# Patient Record
Sex: Female | Born: 1960 | Race: White | Hispanic: No | Marital: Single | Smoking: Current every day smoker
Health system: Southern US, Community
[De-identification: ages and names within clinical notes are randomized; demographics above are authoritative.]

## PROBLEM LIST (undated history)

## (undated) DIAGNOSIS — I10 Essential (primary) hypertension: Secondary | ICD-10-CM

## (undated) DIAGNOSIS — K219 Gastro-esophageal reflux disease without esophagitis: Secondary | ICD-10-CM

## (undated) DIAGNOSIS — I639 Cerebral infarction, unspecified: Secondary | ICD-10-CM

## (undated) DIAGNOSIS — J449 Chronic obstructive pulmonary disease, unspecified: Secondary | ICD-10-CM

## (undated) DIAGNOSIS — F329 Major depressive disorder, single episode, unspecified: Secondary | ICD-10-CM

## (undated) DIAGNOSIS — M1990 Unspecified osteoarthritis, unspecified site: Secondary | ICD-10-CM

## (undated) DIAGNOSIS — F319 Bipolar disorder, unspecified: Secondary | ICD-10-CM

## (undated) DIAGNOSIS — K760 Fatty (change of) liver, not elsewhere classified: Secondary | ICD-10-CM

## (undated) DIAGNOSIS — Z8673 Personal history of transient ischemic attack (TIA), and cerebral infarction without residual deficits: Secondary | ICD-10-CM

## (undated) DIAGNOSIS — M545 Low back pain, unspecified: Secondary | ICD-10-CM

## (undated) DIAGNOSIS — M797 Fibromyalgia: Secondary | ICD-10-CM

## (undated) DIAGNOSIS — R112 Nausea with vomiting, unspecified: Secondary | ICD-10-CM

## (undated) DIAGNOSIS — J45909 Unspecified asthma, uncomplicated: Secondary | ICD-10-CM

## (undated) DIAGNOSIS — F32A Depression, unspecified: Secondary | ICD-10-CM

## (undated) DIAGNOSIS — Z8489 Family history of other specified conditions: Secondary | ICD-10-CM

## (undated) DIAGNOSIS — E669 Obesity, unspecified: Secondary | ICD-10-CM

## (undated) DIAGNOSIS — B1910 Unspecified viral hepatitis B without hepatic coma: Secondary | ICD-10-CM

## (undated) DIAGNOSIS — E785 Hyperlipidemia, unspecified: Secondary | ICD-10-CM

## (undated) DIAGNOSIS — F419 Anxiety disorder, unspecified: Secondary | ICD-10-CM

## (undated) DIAGNOSIS — M542 Cervicalgia: Secondary | ICD-10-CM

## (undated) DIAGNOSIS — Z8711 Personal history of peptic ulcer disease: Secondary | ICD-10-CM

## (undated) DIAGNOSIS — Z8719 Personal history of other diseases of the digestive system: Secondary | ICD-10-CM

## (undated) DIAGNOSIS — K429 Umbilical hernia without obstruction or gangrene: Secondary | ICD-10-CM

## (undated) DIAGNOSIS — G8929 Other chronic pain: Secondary | ICD-10-CM

## (undated) DIAGNOSIS — Z9889 Other specified postprocedural states: Secondary | ICD-10-CM

## (undated) DIAGNOSIS — G43909 Migraine, unspecified, not intractable, without status migrainosus: Secondary | ICD-10-CM

## (undated) HISTORY — PX: ANAL FISSURE REPAIR: SHX2312

## (undated) HISTORY — PX: FRACTURE SURGERY: SHX138

---

## 1997-04-07 HISTORY — PX: SHOULDER SURGERY: SHX246

## 1998-04-07 HISTORY — PX: SHOULDER ARTHROSCOPY W/ ROTATOR CUFF REPAIR: SHX2400

## 1998-05-08 ENCOUNTER — Ambulatory Visit (HOSPITAL_COMMUNITY): Admission: RE | Admit: 1998-05-08 | Discharge: 1998-05-08 | Payer: Self-pay | Admitting: Neurology

## 2000-06-26 ENCOUNTER — Encounter: Admission: RE | Admit: 2000-06-26 | Discharge: 2000-06-26 | Payer: Self-pay | Admitting: Gastroenterology

## 2000-06-26 ENCOUNTER — Encounter: Payer: Self-pay | Admitting: Gastroenterology

## 2002-09-22 ENCOUNTER — Ambulatory Visit (HOSPITAL_BASED_OUTPATIENT_CLINIC_OR_DEPARTMENT_OTHER): Admission: RE | Admit: 2002-09-22 | Discharge: 2002-09-22 | Payer: Self-pay | Admitting: Orthopedic Surgery

## 2003-05-08 ENCOUNTER — Encounter: Admission: RE | Admit: 2003-05-08 | Discharge: 2003-05-08 | Payer: Self-pay | Admitting: Otolaryngology

## 2004-05-21 ENCOUNTER — Encounter: Admission: RE | Admit: 2004-05-21 | Discharge: 2004-05-21 | Payer: Self-pay | Admitting: Allergy and Immunology

## 2005-03-05 ENCOUNTER — Emergency Department (HOSPITAL_COMMUNITY): Admission: EM | Admit: 2005-03-05 | Discharge: 2005-03-05 | Payer: Self-pay | Admitting: Emergency Medicine

## 2005-06-10 ENCOUNTER — Other Ambulatory Visit: Admission: RE | Admit: 2005-06-10 | Discharge: 2005-06-10 | Payer: Self-pay | Admitting: Obstetrics and Gynecology

## 2006-08-03 ENCOUNTER — Ambulatory Visit: Payer: Self-pay | Admitting: Internal Medicine

## 2006-08-24 ENCOUNTER — Ambulatory Visit: Payer: Self-pay | Admitting: Internal Medicine

## 2006-11-19 DIAGNOSIS — K219 Gastro-esophageal reflux disease without esophagitis: Secondary | ICD-10-CM

## 2006-11-19 DIAGNOSIS — J45909 Unspecified asthma, uncomplicated: Secondary | ICD-10-CM | POA: Insufficient documentation

## 2006-11-19 DIAGNOSIS — F329 Major depressive disorder, single episode, unspecified: Secondary | ICD-10-CM

## 2006-11-19 DIAGNOSIS — R51 Headache: Secondary | ICD-10-CM

## 2006-11-19 DIAGNOSIS — Z8619 Personal history of other infectious and parasitic diseases: Secondary | ICD-10-CM

## 2006-11-19 DIAGNOSIS — R519 Headache, unspecified: Secondary | ICD-10-CM | POA: Insufficient documentation

## 2006-11-26 ENCOUNTER — Ambulatory Visit: Payer: Self-pay | Admitting: Internal Medicine

## 2006-12-03 ENCOUNTER — Encounter: Admission: RE | Admit: 2006-12-03 | Discharge: 2006-12-03 | Payer: Self-pay | Admitting: Gastroenterology

## 2006-12-10 ENCOUNTER — Encounter: Admission: RE | Admit: 2006-12-10 | Discharge: 2006-12-10 | Payer: Self-pay | Admitting: Gastroenterology

## 2007-01-25 ENCOUNTER — Other Ambulatory Visit (HOSPITAL_COMMUNITY): Admission: RE | Admit: 2007-01-25 | Discharge: 2007-04-25 | Payer: Self-pay | Admitting: Psychiatry

## 2007-04-19 ENCOUNTER — Telehealth (INDEPENDENT_AMBULATORY_CARE_PROVIDER_SITE_OTHER): Payer: Self-pay | Admitting: *Deleted

## 2007-04-20 DIAGNOSIS — R05 Cough: Secondary | ICD-10-CM

## 2007-04-26 ENCOUNTER — Telehealth (INDEPENDENT_AMBULATORY_CARE_PROVIDER_SITE_OTHER): Payer: Self-pay | Admitting: *Deleted

## 2007-08-31 ENCOUNTER — Telehealth (INDEPENDENT_AMBULATORY_CARE_PROVIDER_SITE_OTHER): Payer: Self-pay | Admitting: *Deleted

## 2007-09-06 ENCOUNTER — Ambulatory Visit: Payer: Self-pay | Admitting: Family Medicine

## 2007-09-06 DIAGNOSIS — M51379 Other intervertebral disc degeneration, lumbosacral region without mention of lumbar back pain or lower extremity pain: Secondary | ICD-10-CM | POA: Insufficient documentation

## 2007-09-06 DIAGNOSIS — M5137 Other intervertebral disc degeneration, lumbosacral region: Secondary | ICD-10-CM

## 2007-09-10 ENCOUNTER — Telehealth: Payer: Self-pay | Admitting: *Deleted

## 2007-09-16 ENCOUNTER — Encounter (INDEPENDENT_AMBULATORY_CARE_PROVIDER_SITE_OTHER): Payer: Self-pay | Admitting: *Deleted

## 2007-09-16 ENCOUNTER — Ambulatory Visit: Payer: Self-pay | Admitting: Family Medicine

## 2007-10-22 ENCOUNTER — Ambulatory Visit: Payer: Self-pay | Admitting: Family Medicine

## 2007-10-22 DIAGNOSIS — H538 Other visual disturbances: Secondary | ICD-10-CM

## 2007-10-22 DIAGNOSIS — R5383 Other fatigue: Secondary | ICD-10-CM | POA: Insufficient documentation

## 2007-10-22 DIAGNOSIS — R5381 Other malaise: Secondary | ICD-10-CM

## 2007-10-25 LAB — CONVERTED CEMR LAB
AST: 18 units/L (ref 0–37)
Albumin: 3.6 g/dL (ref 3.5–5.2)
Alkaline Phosphatase: 58 units/L (ref 39–117)
BUN: 8 mg/dL (ref 6–23)
Bilirubin, Direct: 0.1 mg/dL (ref 0.0–0.3)
CO2: 27 meq/L (ref 19–32)
Chloride: 104 meq/L (ref 96–112)
Eosinophils Relative: 0.8 % (ref 0.0–5.0)
GFR calc Af Amer: 99 mL/min
Glucose, Bld: 84 mg/dL (ref 70–99)
Lymphocytes Relative: 28.3 % (ref 12.0–46.0)
Monocytes Absolute: 0.4 10*3/uL (ref 0.1–1.0)
Monocytes Relative: 5.9 % (ref 3.0–12.0)
Neutrophils Relative %: 64.7 % (ref 43.0–77.0)
Platelets: 247 10*3/uL (ref 150–400)
Potassium: 4.2 meq/L (ref 3.5–5.1)
RDW: 12.6 % (ref 11.5–14.6)
Sodium: 137 meq/L (ref 135–145)
Total Protein: 6.7 g/dL (ref 6.0–8.3)
WBC: 7.6 10*3/uL (ref 4.5–10.5)

## 2007-10-28 ENCOUNTER — Encounter: Payer: Self-pay | Admitting: Family Medicine

## 2008-03-04 DIAGNOSIS — J069 Acute upper respiratory infection, unspecified: Secondary | ICD-10-CM | POA: Insufficient documentation

## 2008-03-06 ENCOUNTER — Ambulatory Visit: Payer: Self-pay | Admitting: Family Medicine

## 2008-07-25 ENCOUNTER — Telehealth: Payer: Self-pay | Admitting: Family Medicine

## 2008-10-27 ENCOUNTER — Telehealth: Payer: Self-pay | Admitting: Family Medicine

## 2008-11-16 ENCOUNTER — Telehealth: Payer: Self-pay | Admitting: Family Medicine

## 2008-11-17 ENCOUNTER — Telehealth: Payer: Self-pay | Admitting: *Deleted

## 2008-11-17 ENCOUNTER — Telehealth: Payer: Self-pay | Admitting: Family Medicine

## 2008-11-22 ENCOUNTER — Telehealth: Payer: Self-pay | Admitting: Family Medicine

## 2008-11-23 ENCOUNTER — Telehealth: Payer: Self-pay | Admitting: Family Medicine

## 2008-12-04 ENCOUNTER — Encounter: Payer: Self-pay | Admitting: *Deleted

## 2008-12-04 ENCOUNTER — Ambulatory Visit: Payer: Self-pay | Admitting: Family Medicine

## 2008-12-04 ENCOUNTER — Telehealth: Payer: Self-pay | Admitting: Family Medicine

## 2008-12-04 DIAGNOSIS — J029 Acute pharyngitis, unspecified: Secondary | ICD-10-CM | POA: Insufficient documentation

## 2008-12-04 LAB — CONVERTED CEMR LAB: Rapid Strep: NEGATIVE

## 2008-12-12 ENCOUNTER — Telehealth: Payer: Self-pay | Admitting: Family Medicine

## 2010-04-28 ENCOUNTER — Encounter: Payer: Self-pay | Admitting: Gastroenterology

## 2010-08-15 ENCOUNTER — Telehealth: Payer: Self-pay | Admitting: Family Medicine

## 2010-08-15 NOTE — Telephone Encounter (Signed)
Pt called and said that she has bronchitis again. Pt no longer has health insurance and does not have a job. Pt is req a cheaper cough medicine than Tussin cough syrup Dr Tawanna Cooler normally calls in. Pt can not afford this med. Pt says Dr Tawanna Cooler normally call in zpak and some type of sterroid and cough syrup. Pls call in to [facility] on [address].

## 2010-08-15 NOTE — Telephone Encounter (Signed)
Dr Tawanna Cooler does not call in medication without an seeing a patient.  She will need an office visit

## 2010-08-16 NOTE — Telephone Encounter (Signed)
I called pt and informed her that she would have to sch ov, as noted. Pt said that she would have to call back.

## 2010-08-20 NOTE — Assessment & Plan Note (Signed)
[facility]                             PULMONARY OFFICE NOTE   NAME:STRIPEJamacia, Jester                    MRN:          [id number]  DATE:11/26/2006                            DOB:          26-Oct-1960    PULMONARY EXTENDED FOLLOWUP VISIT   HISTORY:  A 50 year old white female who states she has been coughing  since the age of 1 and was seen on April 28 and cured of the cough,  which resumed after she stopped taking the medicines, and on May 19 was  seen again with the same symptoms, and again has complete resolution of  all coughing after a combination of Nexium and tramadol, which she again  stopped.  Now, she comes in not coughing, but having increasing upper  abdominal discomfort that is worse when she bends over and associated  with dyspnea that is paroxysmal in nature and not associated with  significant cough or pleuritic pain, fevers, chills, sweats, orthopnea,  PND, or leg swelling.   She called in and requested a treadmill to figure out why she was short  of breath.  However, I did not gather that she had proportionate  dyspnea with exertion either over the phone or today during the  interview.  She also denies any reproducible exertional chest pain. Her  midline pain is slightly worse with deep inspiration and better lying  flat.   PHYSICAL EXAMINATION:  She is an ambulatory obese white female who  cannot answer questions in a straight-forward manner without going off  on a tangent, or skipping to another complaint all together.  VITAL SIGNS:  Stable vital signs.  HEENT:  Unremarkable.  Oropharynx is clear.  LUNGS:  The lung fields are perfectly clear bilaterally to auscultation  and percussion.  CARDIAC:  Regular rate and rhythm without murmur, gallop, or rub.  ABDOMEN:  Soft and benign.  EXTREMITIES:  Warm without calf tenderness, cyanosis, clubbing, or  edema.   Hemoglobin saturation is 97% on room air and we walked her around  the  office 3 full laps.  She did not appear tachypneic, or even overtly  tired with the activity, and her saturation maintained in the mid 90s.   IMPRESSION:  Upper abdominal pain, most likely is either  gastroesophageal reflux disease or related to irritable bowel syndrome.  I have recommended that she take the Nexium perfectly regularly 40 mg  before breakfast and try Pepcid AC 20 mg at bedtime, along with Citrucel  1 teaspoon b.i.d.  If she is having significant abdominal pain, she can  try Librax 1 q.4 p.r.n. while awaiting followup by Dr. Vida Rigger, her  GI physician of record.   Having eliminated all of her coughing with treatment of GERD, and  documented excellent exercise tolerance with no desaturation, I do not  believe pulmonary followup in general, nor a stress test in particular  will be of any value here, and we will see her back on a p.r.n. basis  only.     Charlaine Dalton. Sherene Sires, MD, [facility]  Electronically Signed    MBW/MedQ  DD: 11/26/2006  DT:  11/27/2006  Job #: [id number]   cc:   Petra Kuba, M.D.

## 2010-08-20 NOTE — Assessment & Plan Note (Signed)
[facility]                             PULMONARY OFFICE NOTE   Tammy, Hines                    MRN:          [id number]  DATE:08/24/2006                            DOB:          [birth year]    PULMONARY EXTENDED SUMMARY FOLLOWUP VISIT   HISTORY:  A 50 year old white female, seen on April 28, with a complaint  of coughing since she was a year old. She comes in now complaining of  paroxysms of dyspnea that occur at rest and were not directly related to  cough, but have been present for several years. She is particularly  discouraged that she cannot go up two flights of steps with a backpack  on.  When I mentioned to her that the reason that she was here was for  followup for the cough, she said that that got better, but it always  gets better.  In other words, I was not sure that she actually gave  credit to any of the interventions that were recommended.   She does describe an increasing frequency and severity of coughing  paroxysms to the point where she vomits and urinates. She says sometimes  they are triggered by cold and at other times by seasonal allergies,  spring greater than fall.   What is clear, however, is that there was 100% resolution of the cough  on her previous visit, but no effect at all on her dyspnea.   Today, she describes her dyspnea as occurring paroxysmally at rest. Last  time she described dyspnea only with heavy activity. She also  occasionally wakes up at night and cannot breath. These episodes are  not associated with chest pain and get better with nothing or faster  with Xanax, but she does not want to keep taking that.   PHYSICAL EXAMINATION:  She is an animated white female who has trouble  to sticking to one complaint at a time before shifting to another  completely unrelated issue. She has stable vital signs.  HEENT: Is unremarkable. Oropharynx is clear.  NECK: Supple without cervical adenopathy or  tenderness. Trachea is  midline without organomegaly.  LUNGS: Lung fields are clear bilaterally today to auscultation and  percussion with excellent air movement despite the fact that she states  that she cannot get breath at present.  HEART: Regular rate and rhythm without murmur, gallop or rub or  tachycardia.  ABDOMEN: Soft, benign.  EXTREMITIES: Warm without calf tenderness, cyanosis, clubbing or edema.   IMPRESSION:  Extended discussion with this patient, lasting almost an  hour today, on the following topics:  1. Clearly the cough, regardless of the exact mechanism that is      triggering it, is partly reflux related in that she coughs so hard      she vomits. To offset this problem, I have recommended that she      continue to take omeprazole 30 to 60 minutes before her first and      last meal until any cough or throat clearing is totally delineated      for two full weeks without the need  for any cough suppressants. If      it is not maintained under control with high dose omeprazole, the      next step would be to consider adding Reglan because she says this      worked better than anything else (although note she did not give      it any credit at all because she denies she has heartburn, a common      scenario that is not ).  2. Paroxysm of dyspnea that occurs at rest and resolved without      treatment, most likely panic disorder. I suggested that she use the      Xanax that was previously prescribed.  3. The dyspnea that occurs and reproduced with exercise has been      present for about 16 years and this corresponds to weight gain      and deconditioning historically. I have recommended therefore that      she exercise to a level where she is short of breath but not out of      breath 30 minutes daily for two full weeks to see if there is any      training benefit. If not, a CPST needs to be considered and I have      given her the phone number to call for it.   I  have not specifically excluded the possibility that she has asthma,  but note that the pattern is much more consistent with either panic  disorder, reflux or both and do not recommend treatment for asthma at  all at this point.   I did recommend for nasal drainage to use Zyrtec 10 mg q nightly p.r.n.  and gave her a prescription for generic product.   For cyclical cough, I have recommended Delsym supplemented with Tramadol  and have explained this strategy to her as well.     Charlaine Dalton. Sherene Sires, MD, [facility]  Electronically Signed    MBW/MedQ  DD: 08/24/2006  DT: 08/24/2006  Job #: [id number]

## 2010-08-23 NOTE — Assessment & Plan Note (Signed)
[facility]                             PULMONARY OFFICE NOTE   NAME:Tammy, Hines                    MRN:          [id number]  DATE:08/03/2006                            DOB:          08-Jun-1960    PULMONARY NEW PATIENT EVALUATION:   CHIEF COMPLAINT:  Cough and dyspnea.   HISTORY:  This is a 50 year old female complaining of cough since she  was 50 year old.  The pattern actually is not one of a chronic cough but  rather a recurrent pattern that happens several times per year to the  point where every time she starts coughing she can't stop.  It  typically lasts from 4 weeks to 3 months and eventually goes away with  several rounds of antibiotics and prednisone.  It also flares in the  spring and the fall reproducibly.  For that reason, she underwent  allergy testing in [place], she thinks maybe at Dr. Cyndee Brightly office  that was most positive to dust and mold only.  For the last several  years, it has been much worse, and now it has been persistent for the  last several weeks where she is coughing to the point where she vomits  and urinates on herself, and therefore sought the help of her insurance  nurse, who referred her to our practice.   Presently, the patient coughs more during the day than the night and  does not actually produce much mucus at all.  She denies any fevers,  chills, sweats, orthopnea, PND, or leg swelling, itching, sneezing, or  wheezing.   PAST MEDICAL HISTORY:  1. Moderate obesity.  2. Diagnosis of asthma.  3. Allergies, as noted above.   ALLERGIES:  PENICILLIN and CODEINE, as well as AMOXICILLIN, all  nonspecific reactions.   MEDICATIONS:  Xanax, trazodone, Tessalon, Proventil, and Nasacort (none  of which she says helps).   SOCIAL HISTORY:  She quit smoking in October 2007, and works as a  Company secretary.   FAMILY HISTORY:  Significant for the absence of HB or asthma.   Please see worksheet for  details.   REVIEW OF SYSTEMS:  Taken in detail on the worksheet.  Negative, except  as outlined above.  She does cough so hard, it also hurts in the center  of her chest when she coughs severely.  She is also short of breath with  exertion when she is coughing.  and if she is not coughing she denies  any dyspnea.   PHYSICAL EXAMINATION:  GENERAL: Obese ambulatory white female in no  acute distress, with classic voice fatigue.  VITAL SIGNS:  She is afebrile, with normal vital signs.  HEENT: Unremarkable. Oropharynx is clear.  Dentition intact.  NECK:  Supple, without cervical adenopathy or tenderness.  Trachea is in  the midline.  No thyromegaly.  LUNG FIELDS:  Reveal classic pseudo-wheeze only.  Overall air movement  is excellent.  HEART:  There is a regular rhythm, without murmur, gallop, or rub.  ABDOMEN: Soft, benign.  EXTREMITIES: Warm, without calf tenderness, cyanosis, clubbing, or  edema.   IMPRESSION:  Recurrent pattern  of upper airway cough dating back many  years.  Probably is triggered by URI or viral illness, but then  developed cyclical features with reflux and airway trauma contributing  to destabilization and further coughing.   I therefore recommend the following approach:  1. First, I reviewed a GERD diet and asked her to stop all mint and      menthol products (apparently, she likes Hall's).  2. Start Nexium 40 mg before breakfast and supper perfectly regularly      for the next 2 weeks, and suppress excess coughing with Delsym 2      tsp b.i.d. supplemented with Mepergan, and use prednisone 10 mg      tablets, #14, to taper off over 6 days.   FOLLOWUP:  Will be in 1 weeks, with a chest x-ray and sinus CT scan as  the next logical steps if not improving on the above empiric regimen.     Charlaine Dalton. Sherene Sires, MD, [facility]  Electronically Signed    MBW/MedQ  DD: 08/03/2006  DT: 08/04/2006  Job #: [id number]

## 2014-01-18 ENCOUNTER — Other Ambulatory Visit: Payer: Self-pay | Admitting: Internal Medicine

## 2014-01-18 DIAGNOSIS — Z1239 Encounter for other screening for malignant neoplasm of breast: Secondary | ICD-10-CM

## 2014-01-25 ENCOUNTER — Other Ambulatory Visit: Payer: Self-pay | Admitting: Internal Medicine

## 2014-01-25 DIAGNOSIS — Z1231 Encounter for screening mammogram for malignant neoplasm of breast: Secondary | ICD-10-CM

## 2014-01-27 ENCOUNTER — Ambulatory Visit
Admission: RE | Admit: 2014-01-27 | Discharge: 2014-01-27 | Disposition: A | Discharge status: Home / Self Care | Payer: Medicare Other | Source: Ambulatory Visit | Attending: Internal Medicine | Admitting: Internal Medicine

## 2014-01-27 DIAGNOSIS — Z1231 Encounter for screening mammogram for malignant neoplasm of breast: Secondary | ICD-10-CM

## 2014-01-31 ENCOUNTER — Other Ambulatory Visit: Payer: Self-pay | Admitting: Internal Medicine

## 2014-01-31 DIAGNOSIS — R928 Other abnormal and inconclusive findings on diagnostic imaging of breast: Secondary | ICD-10-CM

## 2014-02-13 ENCOUNTER — Ambulatory Visit
Admission: RE | Admit: 2014-02-13 | Discharge: 2014-02-13 | Disposition: A | Discharge status: Home / Self Care | Payer: Medicare Other | Source: Ambulatory Visit | Attending: Internal Medicine | Admitting: Internal Medicine

## 2014-02-13 DIAGNOSIS — R928 Other abnormal and inconclusive findings on diagnostic imaging of breast: Secondary | ICD-10-CM

## 2014-11-19 ENCOUNTER — Emergency Department (HOSPITAL_COMMUNITY)
Admission: EM | Admit: 2014-11-19 | Discharge: 2014-11-19 | Disposition: A | Discharge status: Home / Self Care | Payer: Medicare Other | Attending: Emergency Medicine | Admitting: Emergency Medicine

## 2014-11-19 DIAGNOSIS — Z23 Encounter for immunization: Secondary | ICD-10-CM | POA: Insufficient documentation

## 2014-11-19 DIAGNOSIS — T63461A Toxic effect of venom of wasps, accidental (unintentional), initial encounter: Secondary | ICD-10-CM | POA: Insufficient documentation

## 2014-11-19 DIAGNOSIS — Z880 Allergy status to penicillin: Secondary | ICD-10-CM | POA: Insufficient documentation

## 2014-11-19 DIAGNOSIS — Y9289 Other specified places as the place of occurrence of the external cause: Secondary | ICD-10-CM | POA: Diagnosis not present

## 2014-11-19 DIAGNOSIS — Y998 Other external cause status: Secondary | ICD-10-CM | POA: Diagnosis not present

## 2014-11-19 DIAGNOSIS — W57XXXA Bitten or stung by nonvenomous insect and other nonvenomous arthropods, initial encounter: Secondary | ICD-10-CM

## 2014-11-19 DIAGNOSIS — Y9389 Activity, other specified: Secondary | ICD-10-CM | POA: Diagnosis not present

## 2014-11-19 DIAGNOSIS — M7989 Other specified soft tissue disorders: Secondary | ICD-10-CM

## 2014-11-19 MED ORDER — CEPHALEXIN 500 MG PO CAPS: 500.0000 mg | ORAL_CAPSULE | Freq: Four times a day (QID) | ORAL | Status: DC

## 2014-11-19 MED ORDER — HYDROCODONE-ACETAMINOPHEN 5-325 MG PO TABS
1.0000 | ORAL_TABLET | Freq: Once | ORAL | Status: AC
  Administered 2014-11-19: 1 via ORAL
  Filled 2014-11-19: qty 1

## 2014-11-19 MED ORDER — TETANUS-DIPHTH-ACELL PERTUSSIS 5-2.5-18.5 LF-MCG/0.5 IM SUSP
0.5000 mL | Freq: Once | INTRAMUSCULAR | Status: AC
  Administered 2014-11-19: 0.5 mL via INTRAMUSCULAR
  Filled 2014-11-19: qty 0.5

## 2014-11-19 MED ORDER — IBUPROFEN 400 MG PO TABS: 800.0000 mg | ORAL_TABLET | Freq: Once | ORAL | Status: DC

## 2014-11-19 NOTE — ED Notes (Signed)
Patient here with complaint of left middle finger swelling secondary to a yellow jacket sting on that finger. More concerning however is that patient has a ring in place at the base of that finger. Swelling has caused ring to become imbedded in skin. Finger is discolored and painful.

## 2014-11-19 NOTE — ED Provider Notes (Signed)
CSN: [id number]     Arrival date & time 11/19/14  0000 History   First MD Initiated Contact with Patient 11/19/14 0031     Chief Complaint  Patient presents with  . Insect Bite  . Joint Swelling     (Consider location/radiation/quality/duration/timing/severity/associated sxs/prior Treatment) HPI Comments: Pt is a 54 yo female who presents to the ED complaining of insect bite and swelling to right 3rd digit, onset yesterday. Pt reports that she was stung by a yellow jacket yesterday afternoon on the dorsal aspect of her right 3rd finger near her PCP. She notes that she woke up this morning and had severe swelling and pain of her finger. She states that she is unable to remove the ring on her finger due to the swelling. Pt reports that she used ice her finger and took a hydrocodone pill (which is prescribed to her for back pain) which had some pain relief. She notes she noticed blisters that formed on the sides of her finger today which began to weep.    No past medical history on file. No past surgical history on file. No family history on file. Social History  Substance Use Topics  . Smoking status: Not on file  . Smokeless tobacco: Not on file  . Alcohol Use: Not on file   OB History    No data available     Review of Systems  Musculoskeletal: Positive for joint swelling.  All other systems reviewed and are negative.     Allergies  Bupropion hcl; Clonazepam; and Penicillins  Home Medications   Prior to Admission medications   Not on File   BP 138/98 mmHg  Pulse 83  Temp(Src) 97.8 F (36.6 C) (Oral)  Resp 12  Ht  (1.727 m)  Wt 287 lb 3 oz (130.267 kg)  BMI 43.68 kg/m2  SpO2 95%  LMP  (LMP Unknown) Physical Exam  Constitutional: She appears well-developed and well-nourished.  HENT:  Head: Normocephalic and atraumatic.  Eyes: Conjunctivae and EOM are normal. Right eye exhibits no discharge. Left eye exhibits no discharge.  Neck: Normal range of motion.   Cardiovascular: Normal rate.   Pulmonary/Chest: Effort normal.  Musculoskeletal:  Swelling of right 3rd digit with weeping blisters located on the medial and lateral aspect of the digit. Ring present on digit. Finger is dusky but has good capillary refill and is warm. Sensation intact of right 3rd digit. ROM limited due to swelling.    Neurological: She is alert.    ED Course  Procedures (including critical care time) Labs Review Labs Reviewed - No data to display  Imaging Review No results found. I, Barrett Henle, personally reviewed and evaluated these images and lab results as part of my medical decision-making.    MDM   Final diagnoses:  Insect bite  Swelling of finger, left    Pt presents with severe swelling of right 3rd digit with ring present on finger. Finger is dusky but is warm with good capillary refill.  Ring will be removed and then re-examined. Pt given pain meds while in ED. Pt reports pain has improved.   Ring removed with ring cutter without any complications.  Finger re-examined and skin is pink and warm with good capillary refill.  Discussed plan for d/c with pt. Pt also given prescription for keflex. Pt advised to return to the ED if sxs worsen. Pt agrees with plan.   Satira Sark [place], [place] 11/19/14 0244  Dione Booze, MD 11/19/14 [phone]

## 2014-11-19 NOTE — ED Notes (Signed)
Pt left at this time with all belongings.  

## 2015-07-11 ENCOUNTER — Other Ambulatory Visit: Payer: Self-pay | Admitting: Internal Medicine

## 2015-07-11 DIAGNOSIS — N63 Unspecified lump in unspecified breast: Secondary | ICD-10-CM

## 2015-07-25 ENCOUNTER — Other Ambulatory Visit: Payer: Self-pay | Admitting: Internal Medicine

## 2015-07-25 DIAGNOSIS — N63 Unspecified lump in unspecified breast: Secondary | ICD-10-CM

## 2015-08-30 ENCOUNTER — Emergency Department (HOSPITAL_COMMUNITY): Payer: Medicare Other

## 2015-08-30 ENCOUNTER — Encounter (HOSPITAL_COMMUNITY): Payer: Self-pay

## 2015-08-30 ENCOUNTER — Emergency Department (HOSPITAL_COMMUNITY)
Admission: EM | Admit: 2015-08-30 | Discharge: 2015-08-30 | Disposition: A | Discharge status: Home / Self Care | Payer: Medicare Other | Attending: Emergency Medicine | Admitting: Emergency Medicine

## 2015-08-30 DIAGNOSIS — Z79899 Other long term (current) drug therapy: Secondary | ICD-10-CM | POA: Diagnosis not present

## 2015-08-30 DIAGNOSIS — F17200 Nicotine dependence, unspecified, uncomplicated: Secondary | ICD-10-CM | POA: Diagnosis not present

## 2015-08-30 DIAGNOSIS — R600 Localized edema: Secondary | ICD-10-CM | POA: Diagnosis not present

## 2015-08-30 DIAGNOSIS — E785 Hyperlipidemia, unspecified: Secondary | ICD-10-CM | POA: Diagnosis not present

## 2015-08-30 DIAGNOSIS — R0609 Other forms of dyspnea: Secondary | ICD-10-CM | POA: Insufficient documentation

## 2015-08-30 DIAGNOSIS — M7989 Other specified soft tissue disorders: Secondary | ICD-10-CM | POA: Diagnosis present

## 2015-08-30 DIAGNOSIS — M79605 Pain in left leg: Secondary | ICD-10-CM | POA: Insufficient documentation

## 2015-08-30 DIAGNOSIS — I10 Essential (primary) hypertension: Secondary | ICD-10-CM | POA: Insufficient documentation

## 2015-08-30 DIAGNOSIS — F319 Bipolar disorder, unspecified: Secondary | ICD-10-CM | POA: Insufficient documentation

## 2015-08-30 DIAGNOSIS — M79604 Pain in right leg: Secondary | ICD-10-CM

## 2015-08-30 DIAGNOSIS — R609 Edema, unspecified: Secondary | ICD-10-CM

## 2015-08-30 HISTORY — DX: Hyperlipidemia, unspecified: E78.5

## 2015-08-30 HISTORY — DX: Major depressive disorder, single episode, unspecified: F32.9

## 2015-08-30 HISTORY — DX: Anxiety disorder, unspecified: F41.9

## 2015-08-30 HISTORY — DX: Bipolar disorder, unspecified: F31.9

## 2015-08-30 HISTORY — DX: Depression, unspecified: F32.A

## 2015-08-30 HISTORY — DX: Essential (primary) hypertension: I10

## 2015-08-30 LAB — CBC WITH DIFFERENTIAL/PLATELET
Basophils Absolute: 0 10*3/uL (ref 0.0–0.1)
Basophils Relative: 0 %
Eosinophils Absolute: 0.2 10*3/uL (ref 0.0–0.7)
Eosinophils Relative: 2 %
HCT: 37.9 % (ref 36.0–46.0)
Hemoglobin: 12 g/dL (ref 12.0–15.0)
Lymphocytes Relative: 26 %
Lymphs Abs: 1.9 10*3/uL (ref 0.7–4.0)
MCH: 29.3 pg (ref 26.0–34.0)
MCHC: 31.7 g/dL (ref 30.0–36.0)
MCV: 92.4 fL (ref 78.0–100.0)
Monocytes Absolute: 0.4 10*3/uL (ref 0.1–1.0)
Monocytes Relative: 6 %
Neutro Abs: 4.9 10*3/uL (ref 1.7–7.7)
Neutrophils Relative %: 66 %
Platelets: 207 10*3/uL (ref 150–400)
RBC: 4.1 MIL/uL (ref 3.87–5.11)
RDW: 13.3 % (ref 11.5–15.5)
WBC: 7.4 10*3/uL (ref 4.0–10.5)

## 2015-08-30 LAB — COMPREHENSIVE METABOLIC PANEL
ALT: 27 U/L (ref 14–54)
AST: 24 U/L (ref 15–41)
Albumin: 3.2 g/dL — ABNORMAL LOW (ref 3.5–5.0)
Alkaline Phosphatase: 61 U/L (ref 38–126)
Anion gap: 7 (ref 5–15)
BUN: 15 mg/dL (ref 6–20)
CO2: 28 mmol/L (ref 22–32)
Calcium: 9 mg/dL (ref 8.9–10.3)
Chloride: 101 mmol/L (ref 101–111)
Creatinine, Ser: 1.01 mg/dL — ABNORMAL HIGH (ref 0.44–1.00)
GFR calc Af Amer: >60 mL/min (ref >60)
GFR calc non Af Amer: >60 mL/min (ref >60)
Glucose, Bld: 120 mg/dL — ABNORMAL HIGH (ref 65–99)
Potassium: 3.7 mmol/L (ref 3.5–5.1)
Sodium: 136 mmol/L (ref 135–145)
Total Bilirubin: 0.3 mg/dL (ref 0.3–1.2)
Total Protein: 6.1 g/dL — ABNORMAL LOW (ref 6.5–8.1)

## 2015-08-30 LAB — URINALYSIS, ROUTINE W REFLEX MICROSCOPIC
Bilirubin Urine: NEGATIVE
Glucose, UA: NEGATIVE mg/dL
Hgb urine dipstick: NEGATIVE
Ketones, ur: NEGATIVE mg/dL
Leukocytes, UA: NEGATIVE
Nitrite: NEGATIVE
Protein, ur: NEGATIVE mg/dL
Specific Gravity, Urine: 1.008 (ref 1.005–1.030)
pH: 7.5 (ref 5.0–8.0)

## 2015-08-30 LAB — BRAIN NATRIURETIC PEPTIDE: B Natriuretic Peptide: 26.9 pg/mL (ref 0.0–100.0)

## 2015-08-30 LAB — D-DIMER, QUANTITATIVE: D-Dimer, Quant: 0.47 ug/mL-FEU (ref 0.00–0.50)

## 2015-08-30 NOTE — ED Provider Notes (Signed)
CSN: [id number]     Arrival date & time 08/30/15  0944 History   First MD Initiated Contact with Patient 08/30/15 1022     Chief Complaint  Patient presents with  . Leg Swelling     (Consider location/radiation/quality/duration/timing/severity/associated sxs/prior Treatment) HPI Comments: Patient is a 55 year old female with history of hypertension and hyperlipidemia who presents with a 3 year history of peripheral edema and shortness of breath on exertion. The patient reports her leg swelling has been worsening over the past 3 months. She states she has bilateral leg pain at rest and on exertion in her thighs, as well as her calves. She states that she never has pain or swelling in one leg more than the other. She has been seen by her primary care provider for this problem. She has tried hydrochlorothiazide without relief. She recently switched to Lasix 60mg  BID 2 days ago. Patient states that she feels that her swelling has gotten worse since she began taking the Lasix. However, patient states that she has had to walk more over the past couple days. Patient called her doctor for this issue and she was sent here. Patient denies orthopnea, PND. Patient denies chest pain, abdominal pain, nausea, vomiting, dysuria. Patient states she is not very active and mostly sits in a recliner chair throughout the day. No history of cancer, recent surgery.  The history is provided by the patient.    Past Medical History  Diagnosis Date  . Hypertension   . Hyperlipidemia   . Bipolar 1 disorder (HCC)   . Anxiety   . Depression    Past Surgical History  Procedure Laterality Date  . Anal fissure repair    . Shoulder arthroscopy     No family history on file. Social History  Substance Use Topics  . Smoking status: Current Every Day Smoker  . Smokeless tobacco: None  . Alcohol Use: No   OB History    No data available     Review of Systems  Constitutional: Negative for fever and chills.  HENT:  Negative for facial swelling and sore throat.   Respiratory: Positive for shortness of breath (on exertion).   Cardiovascular: Negative for chest pain.  Gastrointestinal: Negative for nausea, vomiting and abdominal pain.  Genitourinary: Negative for dysuria.  Musculoskeletal: Positive for back pain (chronic).  Skin: Negative for rash and wound.  Neurological: Negative for headaches.  Psychiatric/Behavioral: The patient is not nervous/anxious.       Allergies  Ampicillin; Clonazepam; Penicillins; and Bupropion hcl  Home Medications   Prior to Admission medications   Medication Sig Start Date End Date Taking? Authorizing Provider  alprazolam Prudy Feeler) 2 MG tablet Take 2 mg by mouth 3 (three) times daily as needed for sleep. 1 mg in the morning, 2 mg in the afternoon, 1 mg at night   Yes Historical Provider, MD  Asenapine Maleate (SAPHRIS) 10 MG SUBL Place 20 mg under the tongue at bedtime.   Yes Historical Provider, MD  atenolol (TENORMIN) 100 MG tablet Take 100 mg by mouth daily.   Yes Historical Provider, MD  clomiPRAMINE (ANAFRANIL) 50 MG capsule Take 50 mg by mouth at bedtime.   Yes Historical Provider, MD  cyclobenzaprine (FLEXERIL) 10 MG tablet Take 10 mg by mouth 2 (two) times daily.   Yes Historical Provider, MD  dicyclomine (BENTYL) 10 MG capsule Take 10 mg by mouth 2 (two) times daily as needed for spasms.   Yes Historical Provider, MD  Diethylpropion HCl (TENUATE PO) Take  75 mg by mouth every evening.   Yes Historical Provider, MD  FLUoxetine (PROZAC) 40 MG capsule Take 80 mg by mouth daily.   Yes Historical Provider, MD  furosemide (LASIX) 20 MG tablet Take 60 mg by mouth 2 (two) times daily.   Yes Historical Provider, MD  HYDROcodone-acetaminophen (NORCO) 10-325 MG tablet Take 1 tablet by mouth every 6 (six) hours as needed.   Yes Historical Provider, MD  hydrOXYzine (VISTARIL) 25 MG capsule Take 25 mg by mouth every 6 (six) hours as needed for anxiety.   Yes Historical  Provider, MD  Multiple Vitamins-Minerals (MULTIVITAMIN WITH MINERALS) tablet Take 1 tablet by mouth daily.   Yes Historical Provider, MD  naproxen (NAPROSYN) 250 MG tablet Take 250 mg by mouth 2 (two) times daily with a meal.   Yes Historical Provider, MD  omeprazole (PRILOSEC) 20 MG capsule Take 20 mg by mouth 2 (two) times daily before a meal.   Yes Historical Provider, MD  simvastatin (ZOCOR) 20 MG tablet Take 20 mg by mouth daily.   Yes Historical Provider, MD  cephALEXin (KEFLEX) 500 MG capsule Take 1 capsule (500 mg total) by mouth 4 (four) times daily. 11/19/14   Hannah Muthersbaugh, PA-C   BP 138/113 mmHg  Pulse 88  Temp(Src) 98.4 F (36.9 C) (Oral)  Resp 17  Ht  (1.702 m)  Wt 131.997 kg  BMI 45.57 kg/m2  SpO2 96%  LMP  (LMP Unknown) Physical Exam  Constitutional: She appears well-developed and well-nourished. No distress.  HENT:  Head: Normocephalic and atraumatic.  Mouth/Throat: Oropharynx is clear and moist. No oropharyngeal exudate.  Eyes: Conjunctivae are normal. Pupils are equal, round, and reactive to light. Right eye exhibits no discharge. Left eye exhibits no discharge. No scleral icterus.  Neck: Normal range of motion. Neck supple. No thyromegaly present.  Cardiovascular: Normal rate, regular rhythm, normal heart sounds and intact distal pulses.  Exam reveals no gallop and no friction rub.   No murmur heard. Pulmonary/Chest: Effort normal and breath sounds normal. No stridor. No respiratory distress. She has no wheezes. She has no rales.  Abdominal: Soft. Bowel sounds are normal. She exhibits no distension. There is no tenderness. There is no rebound and no guarding.  Musculoskeletal: She exhibits edema (bilateral).  5/5 strength in all 4 extremities, normal sensation, tenderness throughout palpation to bilateral lower extremities; mild pitting edema bilaterally; no noticeable erythema or asymmetrical edema  Lymphadenopathy:    She has no cervical adenopathy.   Neurological: She is alert. Coordination normal.  Skin: Skin is warm and dry. No rash noted. She is not diaphoretic. No pallor.  Psychiatric: She has a normal mood and affect.  Nursing note and vitals reviewed.   ED Course  Procedures (including critical care time) Labs Review Labs Reviewed  COMPREHENSIVE METABOLIC PANEL - Abnormal; Notable for the following:    Glucose, Bld 120 (*)    Creatinine, Ser 1.01 (*)    Total Protein 6.1 (*)    Albumin 3.2 (*)    All other components within normal limits  BRAIN NATRIURETIC PEPTIDE  CBC WITH DIFFERENTIAL/PLATELET  URINALYSIS, ROUTINE W REFLEX MICROSCOPIC (NOT AT [facility])  D-DIMER, QUANTITATIVE (NOT AT [facility])    Imaging Review Dg Chest 2 View  08/30/2015  CLINICAL DATA:  Bilateral lower extremity swelling for 2 months, chronic shortness of Breath EXAM: CHEST  2 VIEW COMPARISON:  05/21/2004 FINDINGS: The heart size and mediastinal contours are within normal limits. Both lungs are clear. The visualized skeletal structures  are unremarkable. IMPRESSION: No active cardiopulmonary disease. Electronically Signed   By: Alcide CleverMark  Lukens M.D.   On: 08/30/2015 11:15   I have personally reviewed and evaluated these images and lab results as part of my medical decision-making.   EKG Interpretation   Date/Time:  Thursday Aug 30 2015 11:40:48 EDT Ventricular Rate:  80 PR Interval:  169 QRS Duration: 99 QT Interval:  408 QTC Calculation: 471 R Axis:   -22 Text Interpretation:  Sinus rhythm Borderline left axis deviation Low  voltage, precordial leads Abnormal R-wave progression, early transition No  old tracing to compare Confirmed by JACUBOWITZ  MD, SAM [phone]) on  08/30/2015 11:43:01 AM      MDM   Patient states she feels her swelling has improved since arrival and lying with her legs elevated in the bed. EKG shows NSR, borderline left axis deviation, abnormal R-wave progression. Chest x-ray shows no active cardiopulmonary disease. CBC unremarkable.  CMP shows glucose 120, creatinine 1.01, protein 6.1, albumin 3.2. D-dimer 0.47. BNP 26.9. UA negative. Well's criteria and d-dimer 0.47 makes DVT unlikely. Patient to be discharged to continue at home Lasix with follow-up to PCP and cardiology for further evaluation and treatment. Patient understands and is in agreement with plan. Patient also evaluated by Dr. Ethelda ChickJacubowitz who is in agreement with plan. Patient vitals stable throughout ED course and discharged in satisfactory condition.  Final diagnoses:  Peripheral edema  Bilateral leg pain        Emi Holeslexandra M Eller Sweis, PA-C 08/30/15 1355  Doug SouSam Jacubowitz, MD 08/31/15 [phone]

## 2015-08-30 NOTE — Discharge Instructions (Signed)
Treatment: Continue to take your Lasix as prescribed by your primary care provider. Elevate your legs whenever you are sitting or laying down.  Follow-up: Please follow-up with your primary care provider for further evaluation and treatment of your chronic leg swelling. Please follow-up with cardiology for further evaluation and treatment. Please follow-up with orthopedics for further evaluation of your back pain and thigh numbness.   Edema Edema is an abnormal buildup of fluids in your bodytissues. Edema is somewhatdependent on gravity to pull the fluid to the lowest place in your body. That makes the condition more common in the legs and thighs (lower extremities). Painless swelling of the feet and ankles is common and becomes more likely as you get older. It is also common in looser tissues, like around your eyes.  When the affected area is squeezed, the fluid may move out of that spot and leave a dent for a few moments. This dent is called pitting.  CAUSES  There are many possible causes of edema. Eating too much salt and being on your feet or sitting for a long time can cause edema in your legs and ankles. Hot weather may make edema worse. Common medical causes of edema include:  Heart failure.  Liver disease.  Kidney disease.  Weak blood vessels in your legs.  Cancer.  An injury.  Pregnancy.  Some medications.  Obesity. SYMPTOMS  Edema is usually painless.Your skin may look swollen or shiny.  DIAGNOSIS  Your health care provider may be able to diagnose edema by asking about your medical history and doing a physical exam. You may need to have tests such as X-rays, an electrocardiogram, or blood tests to check for medical conditions that may cause edema.  TREATMENT  Edema treatment depends on the cause. If you have heart, liver, or kidney disease, you need the treatment appropriate for these conditions. General treatment may include:  Elevation of the affected body part  above the level of your heart.  Compression of the affected body part. Pressure from elastic bandages or support stockings squeezes the tissues and forces fluid back into the blood vessels. This keeps fluid from entering the tissues.  Restriction of fluid and salt intake.  Use of a water pill (diuretic). These medications are appropriate only for some types of edema. They pull fluid out of your body and make you urinate more often. This gets rid of fluid and reduces swelling, but diuretics can have side effects. Only use diuretics as directed by your health care provider. HOME CARE INSTRUCTIONS   Keep the affected body part above the level of your heart when you are lying down.   Do not sit still or stand for prolonged periods.   Do not put anything directly under your knees when lying down.  Do not wear constricting clothing or garters on your upper legs.   Exercise your legs to work the fluid back into your blood vessels. This may help the swelling go down.   Wear elastic bandages or support stockings to reduce ankle swelling as directed by your health care provider.   Eat a low-salt diet to reduce fluid if your health care provider recommends it.   Only take medicines as directed by your health care provider. SEEK MEDICAL CARE IF:   Your edema is not responding to treatment.  You have heart, liver, or kidney disease and notice symptoms of edema.  You have edema in your legs that does not improve after elevating them.   You have  sudden and unexplained weight gain. SEEK IMMEDIATE MEDICAL CARE IF:   You develop shortness of breath or chest pain.   You cannot breathe when you lie down.  You develop pain, redness, or warmth in the swollen areas.   You have heart, liver, or kidney disease and suddenly get edema.  You have a fever and your symptoms suddenly get worse. MAKE SURE YOU:   Understand these instructions.  Will watch your condition.  Will get help  right away if you are not doing well or get worse.   This information is not intended to replace advice given to you by your health care provider. Make sure you discuss any questions you have with your health care provider.   Document Released: 03/24/2005 Document Revised: 04/14/2014 Document Reviewed: 01/14/2013 Elsevier Interactive Patient Education [employer]2016 Elsevier Inc.

## 2015-08-30 NOTE — ED Notes (Signed)
Pt statesshe understands instructions. 

## 2015-08-30 NOTE — ED Provider Notes (Signed)
Complains of bilateral leg swelling for 3 years. She has been treated with diuretics for the past 3 years. Her Lasix dosage was recently increased this past week. She states her leg swelling is much improved since awakening this morning. She also complains of dyspnea on exertion for the past 3 years which is unchanged she denies any chest pain. She was sent by her primary care physician this morning to check for "a blood clot in my legs" on exam no distress HEENT exam no facial asymmetry neck supple no JVD no bruit lungs clear auscultation heart regular rate and rhythm abdomen obese nontender bilateral lower extremities with 1+ pretibial pitting edema. Pretest clinical suspicion for pulmonary embolism is low  Doug SouSam Ra Pfiester, MD 08/31/15 [phone]

## 2015-08-30 NOTE — ED Notes (Signed)
Per lab they would accept urine if a tissue had been dropped in it.

## 2015-08-30 NOTE — ED Notes (Signed)
Pt states she has had leg swelling x 3 months. Had medication started over last month and increased 3 days ago. States unable to pee yesterday but peed on arrival to room.

## 2015-09-06 ENCOUNTER — Other Ambulatory Visit: Payer: Medicare Other

## 2015-09-25 ENCOUNTER — Other Ambulatory Visit: Payer: Medicare Other

## 2015-10-01 ENCOUNTER — Other Ambulatory Visit: Payer: Medicare Other

## 2015-10-02 ENCOUNTER — Ambulatory Visit (INDEPENDENT_AMBULATORY_CARE_PROVIDER_SITE_OTHER): Payer: Medicare Other | Admitting: Cardiology

## 2015-10-02 ENCOUNTER — Encounter: Payer: Self-pay | Admitting: Cardiology

## 2015-10-02 VITALS — BP 100/78 | HR 84 | Ht 67.0 in | Wt 295.0 lb

## 2015-10-02 DIAGNOSIS — R609 Edema, unspecified: Secondary | ICD-10-CM

## 2015-10-02 DIAGNOSIS — R0602 Shortness of breath: Secondary | ICD-10-CM

## 2015-10-02 NOTE — Patient Instructions (Signed)
Medication Instructions:  Continue current medications  Labwork: BNP  Testing/Procedures: Your physician has requested that you have an echocardiogram. Echocardiography is a painless test that uses sound waves to create images of your heart. It provides your doctor with information about the size and shape of your heart and how well your heart's chambers and valves are working. This procedure takes approximately one hour. There are no restrictions for this procedure.    Follow-Up: Your physician recommends that you schedule a follow-up appointment in: As Needed   Any Other Special Instructions Will Be Listed Below (If Applicable).     If you need a refill on your cardiac medications before your next appointment, please call your pharmacy.   

## 2015-10-02 NOTE — Progress Notes (Signed)
Cardiology Office Note   Date:  10/02/2015   ID:  Tammy Hines Domenico, DOB 1960-06-26, MRN [id number]  PCP:  Quitman LivingsHASSAN,SAMI, MD  Cardiologist:   Rollene RotundaJames Mazey Mantell, MD   Chief Complaint  Patient presents with  . Edema      History of Present Illness: Tammy Hines Meth is a 55 y.o. female who presents for evaluation of edema.  She has no past cardiac history. She reports she's been getting increasing swelling for several weeks. She was initially on hydrochlorothiazide for management of swelling and then on Lasix. She did lose some fluid weight. She was in the emergency room in May and I reviewed these records. Of note her BNP was normal and she was not thought to have any acute cardiac findings.  She presents for evaluation having had no other workup. She does have morbid obesity but has lost about 25 pounds intentionally. As mentioned if her feet her up for swelling is improved. However, it was very severe when she was in the emergency room. She's not describing PND or orthopnea although she will be short of breath walking 25 yards on level ground. She's not describing chest pressure, neck or arm discomfort. She doesn't notice any palpitations, presyncope or syncope. She does have discomfort in her legs feel like they "weigh a thousand pounds" when she's trying to walk.  Past Medical History  Diagnosis Date  . Hypertension   . Hyperlipidemia   . Bipolar 1 disorder (HCC)   . Anxiety     Past Surgical History  Procedure Laterality Date  . Anal fissure repair    . Shoulder arthroscopy Right     x2     Current Outpatient Prescriptions  Medication Sig Dispense Refill  . alprazolam (XANAX) 2 MG tablet Take 2 mg by mouth 3 (three) times daily as needed for sleep. 1 mg in the morning, 2 mg in the afternoon, 1 mg at night    . Asenapine Maleate (SAPHRIS) 10 MG SUBL Place 20 mg under the tongue at bedtime.    Marland Kitchen. atenolol (TENORMIN) 100 MG tablet Take 100 mg by mouth daily.    . clomiPRAMINE  (ANAFRANIL) 50 MG capsule Take 50 mg by mouth at bedtime.    . cyclobenzaprine (FLEXERIL) 10 MG tablet Take 10 mg by mouth 2 (two) times daily.    Marland Kitchen. dicyclomine (BENTYL) 10 MG capsule Take 10 mg by mouth 2 (two) times daily as needed for spasms.    . Diethylpropion HCl (TENUATE PO) Take 75 mg by mouth every evening.    Marland Kitchen. FLUoxetine (PROZAC) 40 MG capsule Take 80 mg by mouth daily.    . furosemide (LASIX) 20 MG tablet Take 60 mg by mouth 2 (two) times daily.    Marland Kitchen. HYDROcodone-acetaminophen (NORCO) 10-325 MG tablet Take 1 tablet by mouth every 6 (six) hours as needed.    . hydrOXYzine (VISTARIL) 25 MG capsule Take 25 mg by mouth every 6 (six) hours as needed for anxiety.    . Multiple Vitamins-Minerals (MULTIVITAMIN WITH MINERALS) tablet Take 1 tablet by mouth daily.    . naproxen (NAPROSYN) 250 MG tablet Take 250 mg by mouth 2 (two) times daily with a meal.    . omeprazole (PRILOSEC) 20 MG capsule Take 20 mg by mouth 2 (two) times daily before a meal.    . simvastatin (ZOCOR) 20 MG tablet Take 20 mg by mouth daily.     No current facility-administered medications for this visit.    Allergies:  Ampicillin; Clonazepam; Penicillins; and Bupropion hcl    Social History:  The patient  reports that she has been smoking Cigarettes.  She has a 8.75 pack-year smoking history. She does not have any smokeless tobacco history on file. She reports that she does not drink alcohol or use illicit drugs.   Family History:  The patient's family history includes Cancer in her father; Heart attack in her maternal grandfather; Hypertension in her mother.    ROS:  Please see the history of present illness.   Otherwise, review of systems are positive for diarrhea, leg discomfort at her left hip and numbness in her right thigh.   All other systems are reviewed and negative.    PHYSICAL EXAM: VS:  BP 100/78 mmHg  Pulse 84  Ht 5\' 7"  (1.702 m)  Wt 295 lb (133.811 kg)  BMI 46.19 kg/m2  SpO2 96%  LMP  (LMP  Unknown) , BMI Body mass index is 46.19 kg/(m^2). GENERAL:  Well appearing HEENT:  Pupils equal round and reactive, fundi not visualized, oral mucosa unremarkable NECK:  No jugular venous distention, waveform within normal limits, carotid upstroke brisk and symmetric, no bruits, no thyromegaly LYMPHATICS:  No cervical, inguinal adenopathy LUNGS:  Clear to auscultation bilaterally BACK:  No CVA tenderness CHEST:  Unremarkable HEART:  PMI not displaced or sustained,S1 and S2 within normal limits, no S3, no S4, no clicks, no rubs, no murmurs ABD:  Flat, positive bowel sounds normal in frequency in pitch, no bruits, no rebound, no guarding, no midline pulsatile mass, no hepatomegaly, no splenomegaly EXT:  2 plus pulses throughout, mild edema, no cyanosis no clubbing SKIN:  No rashes no nodules NEURO:  Cranial nerves II through XII grossly intact, motor grossly intact throughout PSYCH:  Cognitively intact, oriented to person place and time    EKG:  EKG is not ordered today. The ekg ordered 5/25 demonstrates NSR, rate 80, leftward axis, QTc slightly prolonged, no acute ST T wave changes.  10/02/2015   Recent Labs: 08/30/2015: ALT 27; B Natriuretic Peptide 26.9; BUN 15; Creatinine, Ser 1.01*; Hemoglobin 12.0; Platelets 207; Potassium 3.7; Sodium 136    Lipid Panel No results found for: CHOL, TRIG, HDL, CHOLHDL, VLDL, LDLCALC, LDLDIRECT    Wt Readings from Last 3 Encounters:  10/02/15 295 lb (133.811 kg)  08/30/15 291 lb (131.997 kg)  11/19/14 287 lb 3 oz (130.267 kg)      Other studies Reviewed: Additional studies/ records that were reviewed today include: ED records. Review of the above records demonstrates:  Please see elsewhere in the note.     ASSESSMENT AND PLAN:  EDEMA:  I don't strongly suspect heart failure. Rather I would suspect venous insufficiency complicated by her weight. We had a long discussion about this physiology and conservative therapies and she was given a  recommendation for compression stockings. I will check a BNP and an echocardiogram. If however these are normal and would not suggest further cardiovascular testing. Of note I did review basic metabolic profile and I'll think is any evidence of renal disease such as nephrotic syndrome.   I don't see a recent TSH and will defer to her primary provider.     LEG PAIN:   She has good distal pulses and I would suspect this was related to neuropathic discomfort and would refer her back to her primary provider.  DYSPNEA:  I will evaluate this as above. If these are normal and would not suggest further cardiovascular testing but wouldn't suggest continued weight  loss. I did congratulate her on her success to date.  Current medicines are reviewed at length with the patient today.  The patient does not have concerns regarding medicines.  The following changes have been made:  no change  Labs/ tests ordered today include:   Orders Placed This Encounter  Procedures  . B Nat Peptide  . ECHOCARDIOGRAM COMPLETE     Disposition:   FU with me as needed.     Signed, Rollene Rotunda, MD  10/02/2015 5:28 PM    [facility]

## 2015-10-29 ENCOUNTER — Ambulatory Visit
Admission: RE | Admit: 2015-10-29 | Discharge: 2015-10-29 | Disposition: A | Discharge status: Home / Self Care | Payer: Medicare Other | Source: Ambulatory Visit | Attending: Internal Medicine | Admitting: Internal Medicine

## 2015-10-29 DIAGNOSIS — N63 Unspecified lump in unspecified breast: Secondary | ICD-10-CM

## 2015-10-30 ENCOUNTER — Ambulatory Visit (HOSPITAL_COMMUNITY): Payer: Medicare Other | Attending: Cardiology

## 2015-10-30 ENCOUNTER — Other Ambulatory Visit (HOSPITAL_COMMUNITY): Payer: Self-pay

## 2015-10-30 DIAGNOSIS — Z720 Tobacco use: Secondary | ICD-10-CM | POA: Diagnosis not present

## 2015-10-30 DIAGNOSIS — I119 Hypertensive heart disease without heart failure: Secondary | ICD-10-CM | POA: Insufficient documentation

## 2015-10-30 DIAGNOSIS — R609 Edema, unspecified: Secondary | ICD-10-CM | POA: Diagnosis not present

## 2015-10-30 DIAGNOSIS — R0602 Shortness of breath: Secondary | ICD-10-CM | POA: Diagnosis present

## 2015-10-30 DIAGNOSIS — E785 Hyperlipidemia, unspecified: Secondary | ICD-10-CM | POA: Insufficient documentation

## 2015-10-30 DIAGNOSIS — Z8249 Family history of ischemic heart disease and other diseases of the circulatory system: Secondary | ICD-10-CM | POA: Diagnosis not present

## 2015-12-20 ENCOUNTER — Encounter (HOSPITAL_COMMUNITY): Payer: Self-pay | Admitting: Emergency Medicine

## 2015-12-20 ENCOUNTER — Other Ambulatory Visit: Payer: Self-pay

## 2015-12-20 DIAGNOSIS — I10 Essential (primary) hypertension: Secondary | ICD-10-CM | POA: Insufficient documentation

## 2015-12-20 DIAGNOSIS — F17210 Nicotine dependence, cigarettes, uncomplicated: Secondary | ICD-10-CM | POA: Diagnosis not present

## 2015-12-20 DIAGNOSIS — T402X4A Poisoning by other opioids, undetermined, initial encounter: Secondary | ICD-10-CM | POA: Insufficient documentation

## 2015-12-20 DIAGNOSIS — T424X4A Poisoning by benzodiazepines, undetermined, initial encounter: Secondary | ICD-10-CM | POA: Diagnosis present

## 2015-12-20 DIAGNOSIS — F419 Anxiety disorder, unspecified: Secondary | ICD-10-CM | POA: Insufficient documentation

## 2015-12-20 DIAGNOSIS — T43594A Poisoning by other antipsychotics and neuroleptics, undetermined, initial encounter: Secondary | ICD-10-CM | POA: Insufficient documentation

## 2015-12-20 LAB — CBC
HEMATOCRIT: 37.8 % (ref 36.0–46.0)
Hemoglobin: 12 g/dL (ref 12.0–15.0)
MCH: 29.6 pg (ref 26.0–34.0)
MCHC: 31.7 g/dL (ref 30.0–36.0)
MCV: 93.1 fL (ref 78.0–100.0)
PLATELETS: 233 10*3/uL (ref 150–400)
RBC: 4.06 MIL/uL (ref 3.87–5.11)
RDW: 13.5 % (ref 11.5–15.5)
WBC: 9.2 10*3/uL (ref 4.0–10.5)

## 2015-12-20 LAB — COMPREHENSIVE METABOLIC PANEL
ALBUMIN: 3.7 g/dL (ref 3.5–5.0)
ALK PHOS: 77 U/L (ref 38–126)
ALT: 35 U/L (ref 14–54)
AST: 38 U/L (ref 15–41)
Anion gap: 10 (ref 5–15)
BILIRUBIN TOTAL: 0.4 mg/dL (ref 0.3–1.2)
BUN: 13 mg/dL (ref 6–20)
CHLORIDE: 96 mmol/L — AB (ref 101–111)
CO2: 28 mmol/L (ref 22–32)
CREATININE: 1.02 mg/dL — AB (ref 0.44–1.00)
Calcium: 9.4 mg/dL (ref 8.9–10.3)
GFR calc Af Amer: >60 mL/min (ref >60)
GLUCOSE: 115 mg/dL — AB (ref 65–99)
Potassium: 3.9 mmol/L (ref 3.5–5.1)
SODIUM: 134 mmol/L — AB (ref 135–145)
Total Protein: 6.7 g/dL (ref 6.5–8.1)

## 2015-12-20 LAB — RAPID URINE DRUG SCREEN, HOSP PERFORMED
AMPHETAMINES: NOT DETECTED
BARBITURATES: NOT DETECTED
Benzodiazepines: POSITIVE — AB
Cocaine: NOT DETECTED
Opiates: NOT DETECTED
TETRAHYDROCANNABINOL: NOT DETECTED

## 2015-12-20 LAB — ETHANOL

## 2015-12-20 LAB — ACETAMINOPHEN LEVEL: Acetaminophen (Tylenol), Serum: <10 ug/mL — ABNORMAL LOW (ref 10–30)

## 2015-12-20 LAB — SALICYLATE LEVEL: Salicylate Lvl: <4 mg/dL (ref 2.8–30.0)

## 2015-12-20 NOTE — ED Triage Notes (Signed)
Staffing office and charge nurse notified for pt.'s sitter , purple scrubs given to pt. , security notified to wand pt.  

## 2015-12-20 NOTE — ED Triage Notes (Signed)
Pt. reports intentional overdose on her medications this evening : 3 tabs Xanax 2mg  ; 2 tabs Vistaril 2mg  ; and 2 tabs Oxycodone 10 mg . Alert and oriented , respirations unlabored .

## 2015-12-21 ENCOUNTER — Emergency Department (HOSPITAL_COMMUNITY)
Admission: EM | Admit: 2015-12-21 | Discharge: 2015-12-21 | Disposition: A | Discharge status: Home / Self Care | Payer: Medicare Other | Attending: Emergency Medicine | Admitting: Emergency Medicine

## 2015-12-21 DIAGNOSIS — F419 Anxiety disorder, unspecified: Secondary | ICD-10-CM

## 2015-12-21 DIAGNOSIS — T50904A Poisoning by unspecified drugs, medicaments and biological substances, undetermined, initial encounter: Secondary | ICD-10-CM

## 2015-12-21 MED ORDER — ADULT MULTIVITAMIN W/MINERALS CH
1.0000 | ORAL_TABLET | Freq: Every day | ORAL | Status: DC
  Administered 2015-12-21: 1 via ORAL
  Filled 2015-12-21: qty 1

## 2015-12-21 MED ORDER — NAPROXEN 250 MG PO TABS
500.0000 mg | ORAL_TABLET | Freq: Two times a day (BID) | ORAL | Status: DC
  Administered 2015-12-21: 500 mg via ORAL
  Filled 2015-12-21: qty 2

## 2015-12-21 MED ORDER — DIPHENHYDRAMINE HCL 25 MG PO CAPS: 25.0000 mg | ORAL_CAPSULE | Freq: Four times a day (QID) | ORAL | Status: DC | PRN

## 2015-12-21 MED ORDER — ATENOLOL 50 MG PO TABS: 100.0000 mg | ORAL_TABLET | Freq: Every day | ORAL | Status: DC

## 2015-12-21 MED ORDER — IBUPROFEN 400 MG PO TABS
600.0000 mg | ORAL_TABLET | Freq: Four times a day (QID) | ORAL | Status: DC | PRN
  Administered 2015-12-21: 600 mg via ORAL
  Filled 2015-12-21: qty 1

## 2015-12-21 MED ORDER — ALBUTEROL SULFATE HFA 108 (90 BASE) MCG/ACT IN AERS
2.0000 | INHALATION_SPRAY | Freq: Four times a day (QID) | RESPIRATORY_TRACT | Status: DC | PRN
  Administered 2015-12-21: 2 via RESPIRATORY_TRACT
  Filled 2015-12-21: qty 6.7

## 2015-12-21 MED ORDER — SIMVASTATIN 20 MG PO TABS: 20.0000 mg | ORAL_TABLET | Freq: Every day | ORAL | Status: DC

## 2015-12-21 MED ORDER — FUROSEMIDE 20 MG PO TABS: 60.0000 mg | ORAL_TABLET | Freq: Two times a day (BID) | ORAL | Status: DC

## 2015-12-21 MED ORDER — FLUOXETINE HCL 20 MG PO CAPS
80.0000 mg | ORAL_CAPSULE | Freq: Every day | ORAL | Status: DC
  Administered 2015-12-21: 80 mg via ORAL
  Filled 2015-12-21: qty 4

## 2015-12-21 MED ORDER — HYDROXYZINE PAMOATE 25 MG PO CAPS
25.0000 mg | ORAL_CAPSULE | Freq: Four times a day (QID) | ORAL | Status: DC | PRN
  Filled 2015-12-21: qty 1

## 2015-12-21 MED ORDER — DICYCLOMINE HCL 10 MG PO CAPS
10.0000 mg | ORAL_CAPSULE | Freq: Three times a day (TID) | ORAL | Status: DC
  Administered 2015-12-21: 10 mg via ORAL
  Filled 2015-12-21: qty 1

## 2015-12-21 MED ORDER — CLOMIPRAMINE HCL 25 MG PO CAPS: 50.0000 mg | ORAL_CAPSULE | Freq: Every day | ORAL | Status: DC

## 2015-12-21 MED ORDER — ASENAPINE MALEATE 10 MG SL SUBL: 20.0000 mg | SUBLINGUAL_TABLET | Freq: Every day | SUBLINGUAL | Status: DC

## 2015-12-21 MED ORDER — PANTOPRAZOLE SODIUM 40 MG PO TBEC
40.0000 mg | DELAYED_RELEASE_TABLET | Freq: Every day | ORAL | Status: DC
  Administered 2015-12-21: 40 mg via ORAL
  Filled 2015-12-21: qty 1

## 2015-12-21 NOTE — ED Notes (Signed)
Pt unwilling to follow rules and procedures. Pt unwilling to leave stuff out of room, refusing to remove personal belongings stating "im not gonna leave my stuff with strangers".Pt informed that personal belongings did not have to stay with ED staff. Belonging can go with husband/family member.  Pt stating "i didn't want to be here in the first place. I'm gonna go to the bathroom and go home." Pt not IVC.

## 2015-12-21 NOTE — ED Notes (Signed)
Pt signed no harm contract with this RN.

## 2015-12-21 NOTE — ED Notes (Signed)
This RN spoke with admissions at [facility]. They are still unsure when the pt will have her TTS completed.

## 2015-12-21 NOTE — ED Notes (Signed)
This RN and Hannie NT attempted to get the pts rings off of her fingers. We were successful in getting one ring off.

## 2015-12-21 NOTE — ED Notes (Signed)
BH called, they stated that they should be able to assess the pt within the next hour.

## 2015-12-21 NOTE — ED Notes (Signed)
Pt significant other stating pt does not want to work with female nursing staff. Only female

## 2015-12-21 NOTE — BH Assessment (Signed)
Tele Assessment Note   Tammy Hines is an 55 y.o. female who came to the emergency room after her boyfriend called mobile crisis due to being concerned about her safety. Pt admits that she was upset and has a history of anxiety, bipolar disorder and borderline personality disorder and they had been arguing all day. She states that the arugment got to her and she started having a panic attack, crying and was very visibly upset. She denies having any suicidal ideation at that time or presently. She states that she did take some extra of her anxiety medication to "calm her down" but did not overdose on anything. She states that her boyfriend was just concerned and didn't know what to do. She states that she currently sees Dr. Evelene Croon and is stable on her medications she says that she takes them as prescribed and does not need an adjustment. She states that her cat passed away 2 weeks ago so she is also upset from this. Pt denies any HI, or A/V hallucinations at this time and no history of this. No previous history of inpatient admission noted. Pt denies substance abuse as well. No history of trauma noted in her history either.   Disposition: Per Malachy Chamber NP pt is safe to return home with outpatient referrals and no harm contract signed. Pt states that she can contract for safety and would not hurt herself at home.    Diagnosis: Bipolar 1 Disorder, Generalized Anxiety Disorder  Past Medical History:  Past Medical History:  Diagnosis Date  . Anxiety   . Bipolar 1 disorder (HCC)   . Hyperlipidemia   . Hypertension     Past Surgical History:  Procedure Laterality Date  . ANAL FISSURE REPAIR    . SHOULDER ARTHROSCOPY Right    x2    Family History:  Family History  Problem Relation Age of Onset  . Hypertension Mother   . Cancer Father     esophageal  . Heart attack Maternal Grandfather     Social History:  reports that she has been smoking Cigarettes.  She has a 8.75 pack-year  smoking history. She has never used smokeless tobacco. She reports that she does not drink alcohol or use drugs.  Additional Social History:  Alcohol / Drug Use History of alcohol / drug use?: No history of alcohol / drug abuse  CIWA: CIWA-Ar BP: 125/70 Pulse Rate: 65 COWS: Clinical Opiate Withdrawal Scale (COWS) Resting Pulse Rate: Pulse Rate 81-100 Sweating: No report of chills or flushing Restlessness: Able to sit still Pupil Size: Pupils pinned or normal size for room light Bone or Joint Aches: Not present Runny Nose or Tearing: Not present GI Upset: No GI symptoms Tremor: No tremor Yawning: No yawning Anxiety or Irritability: Patient obviously irritable/anxious Gooseflesh Skin: Skin is smooth COWS Total Score: 3  PATIENT STRENGTHS: (choose at least two) Average or above average intelligence Supportive family/friends  Allergies:  Allergies  Allergen Reactions  . Ampicillin Hives and Nausea And Vomiting  . Clonazepam Other (See Comments)    Pt does not remember what the reaction was  . Other Other (See Comments)    Allergy to mold, down and feathers per allergy test  . Penicillins Other (See Comments)    Pt told not to take because of reaction to ampicillin - no other information available  . Bupropion Hcl Itching and Anxiety    Home Medications:  (Not in a hospital admission)  OB/GYN Status:  No LMP recorded (lmp unknown).  Patient is postmenopausal.  General Assessment Data Location of Assessment: [facility] ED TTS Assessment: In system Is this a Tele or Face-to-Face Assessment?: Tele Assessment Is this an Initial Assessment or a Re-assessment for this encounter?: Initial Assessment Marital status: Single Is patient pregnant?: No Pregnancy Status: No Living Arrangements: Spouse/significant other Can pt return to current living arrangement?: Yes Admission Status: Voluntary Is patient capable of signing voluntary admission?: Yes Referral Source:  Self/Family/Friend Insurance type: Medicare     Crisis Care Plan Living Arrangements: Spouse/significant other Name of Psychiatrist: Dr. Evelene Croon Name of Therapist: None  Education Status Is patient currently in school?: No Highest grade of school patient has completed: Bachelors degree  Risk to self with the past 6 months Suicidal Ideation: No Has patient been a risk to self within the past 6 months prior to admission? : No Suicidal Intent: No Has patient had any suicidal intent within the past 6 months prior to admission? : No Is patient at risk for suicide?: No Suicidal Plan?: No Has patient had any suicidal plan within the past 6 months prior to admission? : No What has been your use of drugs/alcohol within the last 12 months?: states she is using meds as precribed  Previous Attempts/Gestures:  (Denies) Intentional Self Injurious Behavior: None Family Suicide History: Unknown Recent stressful life event(s): Conflict (Comment) Persecutory voices/beliefs?: No Depression: Yes Depression Symptoms: Tearfulness, Feeling angry/irritable Substance abuse history and/or treatment for substance abuse?: No Suicide prevention information given to non-admitted patients: Not applicable  Risk to Others within the past 6 months Homicidal Ideation: No Does patient have any lifetime risk of violence toward others beyond the six months prior to admission? : No Thoughts of Harm to Others: No Current Homicidal Intent: No Current Homicidal Plan: No Access to Homicidal Means: No Identified Victim: no History of harm to others?: No Assessment of Violence: None Noted Violent Behavior Description: none Does patient have access to weapons?: No Criminal Charges Pending?: No Does patient have a court date: No Is patient on probation?: No  Psychosis Hallucinations: None noted Delusions: None noted  Mental Status Report Appearance/Hygiene: Disheveled Eye Contact: Fair Motor Activity: Freedom  of movement Speech: Slurred Level of Consciousness: Alert Mood: Irritable Affect: Labile Anxiety Level: Severe Thought Processes: Coherent Judgement: Impaired Orientation: Person, Place, Situation, Time Obsessive Compulsive Thoughts/Behaviors: Moderate  Cognitive Functioning Concentration: Good Memory: Recent Intact, Remote Intact IQ: Average Insight: Poor Impulse Control: Fair Appetite: Fair Weight Loss: 20 Weight Gain: 0 Sleep: No Change Total Hours of Sleep: 8 Vegetative Symptoms: None  ADLScreening [facility] Assessment Services) Patient's cognitive ability adequate to safely complete daily activities?: Yes Patient able to express need for assistance with ADLs?: Yes Independently performs ADLs?: Yes (appropriate for developmental age)  Prior Inpatient Therapy Prior Inpatient Therapy: No  Prior Outpatient Therapy Prior Outpatient Therapy: Yes Prior Therapy Dates: ongoing Prior Therapy Facilty/Provider(s): Dr. Evelene Croon Reason for Treatment: Medication Mangement Does patient have an ACCT team?: No Does patient have Intensive In-House Services?  : No Does patient have [facility] services? : No Does patient have P4CC services?: No  ADL Screening (condition at time of admission) Patient's cognitive ability adequate to safely complete daily activities?: Yes Is the patient deaf or have difficulty hearing?: No Does the patient have difficulty seeing, even when wearing glasses/contacts?: No Does the patient have difficulty concentrating, remembering, or making decisions?: No Patient able to express need for assistance with ADLs?: Yes Does the patient have difficulty dressing or bathing?: No Independently performs ADLs?: Yes (appropriate for  developmental age) Does the patient have difficulty walking or climbing stairs?: No Weakness of Legs: None Weakness of Arms/Hands: None  Home Assistive Devices/Equipment Home Assistive Devices/Equipment: None  Therapy Consults (therapy  consults require a physician order) PT Evaluation Needed: No OT Evalulation Needed: No SLP Evaluation Needed: No Abuse/Neglect Assessment (Assessment to be complete while patient is alone) Physical Abuse: Denies Verbal Abuse: Denies Sexual Abuse: Denies Exploitation of patient/patient's resources: Denies Self-Neglect: Denies Values / Beliefs Cultural Requests During Hospitalization: None Spiritual Requests During Hospitalization: None Consults Spiritual Care Consult Needed: No Social Work Consult Needed: No Merchant navy officerAdvance Directives (For Healthcare) Does patient have an advance directive?: No Would patient like information on creating an advanced directive?: No - patient declined information Nutrition Screen- [facility] Adult/WL/AP Patient's home diet: Regular Has the patient recently lost weight without trying?: No Has the patient been eating poorly because of a decreased appetite?: No Malnutrition Screening Tool Score: 0  Additional Information 1:1 In Past 12 Months?: No CIRT Risk: No Elopement Risk: No Does patient have medical clearance?: Yes     Disposition:  Disposition Initial Assessment Completed for this Encounter: Yes  Cashius Grandstaff 12/21/2015 12:34 PM

## 2015-12-21 NOTE — ED Notes (Signed)
TTS video console set up at bedside .

## 2015-12-21 NOTE — ED Notes (Signed)
Took pt to restroom via wheelchair 

## 2015-12-21 NOTE — ED Notes (Signed)
Ordered breakfast tray at 0505--Ty

## 2015-12-21 NOTE — ED Notes (Signed)
Pt updated about plan °

## 2015-12-21 NOTE — ED Notes (Signed)
Meal tray delivered to pt

## 2015-12-21 NOTE — ED Notes (Signed)
Pt given phone, [facility]

## 2015-12-21 NOTE — ED Notes (Signed)
BH on the phone with the pt. Neither cart would work to allow a video assessment to be completed.

## 2015-12-21 NOTE — ED Notes (Signed)
TTS set up at bedside. 

## 2015-12-21 NOTE — ED Provider Notes (Signed)
[facility]-EMERGENCY DEPT Provider Note   CSN: [id number] Arrival date & time: 12/20/15  2135    History   Chief Complaint Chief Complaint  Patient presents with  . Drug Overdose    HPI Tammy Hines is a 55 y.o. female.  55 y/o F with hx of HTN, HLD, bipolar 1 d/o, and anxiety presents to the ED for overdose. Patient reports taking 3 tablets of 2mg  Xanax, 2 tablets of 2mg  Vistaril, and 2 tablets of 10mg  Oxycodone in an effort to help her anxiety. Patient states that she was increasingly anxious because she was "fighting with my boyfriend all day". Ingestion was at 1800 tonight. She denies ingestion to be in an effort to kill herself. Denies hx of suicide attempt or BH hospitalization. She denies SI and HI currently. She states that she also "ate everything in the fridge" to try and improve her anxiety symptoms. She was urged to be evaluated by her boyfriend which is the reason for her presentation tonight. Patient with no pain c/o.  Psychiatrist - Dr. Lafayette Dragon. Last appt 1 wk ago.   The history is provided by the patient. No language interpreter was used.  Drug Overdose     Past Medical History:  Diagnosis Date  . Anxiety   . Bipolar 1 disorder (HCC)   . Hyperlipidemia   . Hypertension     Patient Active Problem List   Diagnosis Date Noted  . SORE THROAT 12/04/2008  . VIRAL URI 03/04/2008  . BLURRED VISION 10/22/2007  . FATIGUE 10/22/2007  . DISC DISEASE, LUMBAR 09/06/2007  . COUGH 04/20/2007  . DEPRESSION 11/19/2006  . ASTHMA 11/19/2006  . GERD 11/19/2006  . HEADACHE 11/19/2006  . HEPATITIS B, HX OF 11/19/2006    Past Surgical History:  Procedure Laterality Date  . ANAL FISSURE REPAIR    . SHOULDER ARTHROSCOPY Right    x2    OB History    No data available       Home Medications    Prior to Admission medications   Medication Sig Start Date End Date Taking? Authorizing Provider  alprazolam Prudy Feeler) 2 MG tablet Take 2 mg by mouth 3 (three) times daily as  needed for sleep. 1 mg in the morning, 2 mg in the afternoon, 1 mg at night    Historical Provider, MD  Asenapine Maleate (SAPHRIS) 10 MG SUBL Place 20 mg under the tongue at bedtime.    Historical Provider, MD  atenolol (TENORMIN) 100 MG tablet Take 100 mg by mouth daily.    Historical Provider, MD  clomiPRAMINE (ANAFRANIL) 50 MG capsule Take 50 mg by mouth at bedtime.    Historical Provider, MD  cyclobenzaprine (FLEXERIL) 10 MG tablet Take 10 mg by mouth 2 (two) times daily.    Historical Provider, MD  dicyclomine (BENTYL) 10 MG capsule Take 10 mg by mouth 2 (two) times daily as needed for spasms.    Historical Provider, MD  Diethylpropion HCl (TENUATE PO) Take 75 mg by mouth every evening.    Historical Provider, MD  FLUoxetine (PROZAC) 40 MG capsule Take 80 mg by mouth daily.    Historical Provider, MD  furosemide (LASIX) 20 MG tablet Take 60 mg by mouth 2 (two) times daily.    Historical Provider, MD  HYDROcodone-acetaminophen (NORCO) 10-325 MG tablet Take 1 tablet by mouth every 6 (six) hours as needed.    Historical Provider, MD  hydrOXYzine (VISTARIL) 25 MG capsule Take 25 mg by mouth every 6 (six) hours as needed  for anxiety.    Historical Provider, MD  Multiple Vitamins-Minerals (MULTIVITAMIN WITH MINERALS) tablet Take 1 tablet by mouth daily.    Historical Provider, MD  naproxen (NAPROSYN) 250 MG tablet Take 250 mg by mouth 2 (two) times daily with a meal.    Historical Provider, MD  omeprazole (PRILOSEC) 20 MG capsule Take 20 mg by mouth 2 (two) times daily before a meal.    Historical Provider, MD  simvastatin (ZOCOR) 20 MG tablet Take 20 mg by mouth daily.    Historical Provider, MD    Family History Family History  Problem Relation Age of Onset  . Hypertension Mother   . Cancer Father     esophageal  . Heart attack Maternal Grandfather     Social History Social History  Substance Use Topics  . Smoking status: Current Every Day Smoker    Packs/day: 0.25    Years: 35.00     Types: Cigarettes  . Smokeless tobacco: Never Used  . Alcohol use No     Allergies   Ampicillin; Clonazepam; Penicillins; and Bupropion hcl   Review of Systems Review of Systems  Psychiatric/Behavioral: The patient is nervous/anxious.   Ten systems reviewed and are negative for acute change, except as noted in the HPI.     Physical Exam Updated Vital Signs BP 95/61 (BP Location: Left Arm)   Pulse 76   Temp 98.4 F (36.9 C)   Resp 24   LMP  (LMP Unknown)   SpO2 96%   Physical Exam  Constitutional: She is oriented to person, place, and time. She appears well-developed and well-nourished. No distress.  Nontoxic appearing  HENT:  Head: Normocephalic and atraumatic.  Eyes: Conjunctivae and EOM are normal. No scleral icterus.  Neck: Normal range of motion.  Cardiovascular: Normal rate, regular rhythm and intact distal pulses.   Pulmonary/Chest: Effort normal. No respiratory distress. She has no wheezes. She has no rales.  Respirations even and unlabored  Musculoskeletal: Normal range of motion.  Neurological: She is alert and oriented to person, place, and time.  Ambulatory with steady gait.  Skin: Skin is warm and dry. No rash noted. She is not diaphoretic. No erythema. No pallor.  Psychiatric: Her behavior is normal.  Seemingly annoyed, but calm and cooperative. Denies SI/HI.  Nursing note and vitals reviewed.    ED Treatments / Results  Labs (all labs ordered are listed, but only abnormal results are displayed) Labs Reviewed  COMPREHENSIVE METABOLIC PANEL - Abnormal; Notable for the following:       Result Value   Sodium 134 (*)    Chloride 96 (*)    Glucose, Bld 115 (*)    Creatinine, Ser 1.02 (*)    All other components within normal limits  ACETAMINOPHEN LEVEL - Abnormal; Notable for the following:    Acetaminophen (Tylenol), Serum <10 (*)    All other components within normal limits  URINE RAPID DRUG SCREEN, HOSP PERFORMED - Abnormal; Notable for the  following:    Benzodiazepines POSITIVE (*)    All other components within normal limits  ETHANOL  SALICYLATE LEVEL  CBC    EKG  EKG Interpretation None       Radiology No results found.  Procedures Procedures (including critical care time)  Medications Ordered in ED Medications - No data to display   Initial Impression / Assessment and Plan / ED Course  I have reviewed the triage vital signs and the nursing notes.  Pertinent labs & imaging results that were  available during my care of the patient were reviewed by me and considered in my medical decision making (see chart for details).  Clinical Course    Patient medically cleared. She is pending TTS evaluation after an overdose at 1800 yesterday. She denies SI currently or intent to harm herself yesterday. Patient to be dispositioned pending TTS recommendations. No indication for IVC based on my assessment of the patient.   Final Clinical Impressions(s) / ED Diagnoses   Final diagnoses:  Anxiety  Overdose, undetermined intent, initial encounter    New Prescriptions New Prescriptions   No medications on file     Antony Madura, PA-C 12/21/15 0602    Derwood Kaplan, MD 12/21/15 2307

## 2015-12-21 NOTE — ED Notes (Signed)
This RN spoke with behavioral health. They are unsure of when the pt will have the TTS completed.

## 2015-12-21 NOTE — ED Provider Notes (Signed)
On reassessment, patient still not suicidal. Seems like she was self-medicating for stressors. [facility] has consulted and also thinks patient is safe for discharge.  Plan for same with outpatient follow up.    Marily MemosJason Dylann Gallier, MD 12/21/15 1310

## 2015-12-21 NOTE — ED Notes (Signed)
[facility] said that they would attempt to call the pt again since the last call did not go through.

## 2015-12-26 ENCOUNTER — Telehealth: Payer: Self-pay | Admitting: Cardiology

## 2015-12-26 DIAGNOSIS — R609 Edema, unspecified: Secondary | ICD-10-CM

## 2015-12-26 DIAGNOSIS — R0602 Shortness of breath: Secondary | ICD-10-CM

## 2015-12-26 NOTE — Telephone Encounter (Signed)
New Message  Pt voiced she is calling to get results of her Echo and pt voiced she needs labs and needs a nurse to call her back about setting up her labs.  Please f/u with pt

## 2015-12-26 NOTE — Telephone Encounter (Signed)
Called back. Pt voiced that she can't remember if we called about echo results. Noted she spoke to [place] in July regarding these, I discussed results again w/ patient. Also was asking about labwork, I noted recommendation for BNP. Unsure if initial order still valid, so I reordered BNP for patient and [facility]  Pt aware to call if new concerns, all questions at this time were addressed to patient's satisfaction.

## 2015-12-28 ENCOUNTER — Telehealth: Payer: Self-pay | Admitting: Cardiology

## 2015-12-28 LAB — BRAIN NATRIURETIC PEPTIDE: Brain Natriuretic Peptide: 108.3 pg/mL — ABNORMAL HIGH (ref <100)

## 2015-12-28 NOTE — Telephone Encounter (Signed)
New message  Pt c/o medication issue:  1. Name of Medication: lasix   2. How are you currently taking this medication (dosage and times per day)? Per pt states her PCP instructed her to take 3 in the am and 3 in pm  3. Are you having a reaction (difficulty breathing--STAT)? Per pt has been urinating on herself.   4. What is your medication issue?  Pt call requesting to speak with RN. Pt states she was taking the 3 for am and 3 for pm and was causing her to mess up clothing. Pt states she has stop taking the med for the pass 4 days and is still urinating on herself. Pt would like further instructions on what to do next from her cardiologist. Please call back to discuss

## 2015-12-28 NOTE — Telephone Encounter (Signed)
Returned call to patient no answer.LMTC. 

## 2015-12-29 ENCOUNTER — Telehealth: Payer: Self-pay | Admitting: Student

## 2015-12-29 NOTE — Telephone Encounter (Signed)
  Received a call from the patient that she is urinating frequently and soiling herself multiple times per day, worse in the evening. She quit taking her Lasix (was on 120mg  BID) on Monday due to excessive urination and has noticed increased lower extremity edema since stopping it.   Advised her to resume previous dosing of Lasix 60mg  BID and take earlier in the day to help avoid incontinence episodes at night (first dose between 0700-0800 and evening dose at 1700-1800). Recommended she follow-up with her PCP in regards to worsening incontinence.   Patient voiced understanding of this and was appreciative of the call.   Signed, Ellsworth LennoxBrittany M Mikeya Tomasetti, PA-C 12/29/2015, 10:46 AM Pager: [phone]

## 2015-12-31 ENCOUNTER — Encounter (HOSPITAL_COMMUNITY): Payer: Self-pay | Admitting: *Deleted

## 2015-12-31 ENCOUNTER — Emergency Department (HOSPITAL_COMMUNITY)
Admission: EM | Admit: 2015-12-31 | Discharge: 2015-12-31 | Disposition: A | Discharge status: Home / Self Care | Payer: Medicare Other | Attending: Emergency Medicine | Admitting: Emergency Medicine

## 2015-12-31 ENCOUNTER — Emergency Department (HOSPITAL_COMMUNITY): Payer: Medicare Other

## 2015-12-31 DIAGNOSIS — R339 Retention of urine, unspecified: Secondary | ICD-10-CM | POA: Diagnosis not present

## 2015-12-31 DIAGNOSIS — J45909 Unspecified asthma, uncomplicated: Secondary | ICD-10-CM | POA: Diagnosis not present

## 2015-12-31 DIAGNOSIS — K429 Umbilical hernia without obstruction or gangrene: Secondary | ICD-10-CM | POA: Diagnosis not present

## 2015-12-31 DIAGNOSIS — F17210 Nicotine dependence, cigarettes, uncomplicated: Secondary | ICD-10-CM | POA: Diagnosis not present

## 2015-12-31 DIAGNOSIS — I10 Essential (primary) hypertension: Secondary | ICD-10-CM | POA: Insufficient documentation

## 2015-12-31 DIAGNOSIS — R32 Unspecified urinary incontinence: Secondary | ICD-10-CM | POA: Diagnosis present

## 2015-12-31 DIAGNOSIS — N39490 Overflow incontinence: Secondary | ICD-10-CM | POA: Diagnosis not present

## 2015-12-31 LAB — URINALYSIS, ROUTINE W REFLEX MICROSCOPIC
Bilirubin Urine: NEGATIVE
Glucose, UA: NEGATIVE mg/dL
Hgb urine dipstick: NEGATIVE
KETONES UR: NEGATIVE mg/dL
LEUKOCYTES UA: NEGATIVE
NITRITE: NEGATIVE
PH: 7 (ref 5.0–8.0)
PROTEIN: NEGATIVE mg/dL
Specific Gravity, Urine: <1.005 — ABNORMAL LOW (ref 1.005–1.030)

## 2015-12-31 LAB — CBC WITH DIFFERENTIAL/PLATELET
BASOS PCT: 0 %
Basophils Absolute: 0 10*3/uL (ref 0.0–0.1)
EOS ABS: 0.2 10*3/uL (ref 0.0–0.7)
EOS PCT: 2 %
HCT: 41.4 % (ref 36.0–46.0)
Hemoglobin: 13.2 g/dL (ref 12.0–15.0)
LYMPHS ABS: 1.9 10*3/uL (ref 0.7–4.0)
Lymphocytes Relative: 20 %
MCH: 29.9 pg (ref 26.0–34.0)
MCHC: 31.9 g/dL (ref 30.0–36.0)
MCV: 93.9 fL (ref 78.0–100.0)
MONO ABS: 0.5 10*3/uL (ref 0.1–1.0)
MONOS PCT: 5 %
Neutro Abs: 7 10*3/uL (ref 1.7–7.7)
Neutrophils Relative %: 73 %
PLATELETS: 258 10*3/uL (ref 150–400)
RBC: 4.41 MIL/uL (ref 3.87–5.11)
RDW: 14 % (ref 11.5–15.5)
WBC: 9.7 10*3/uL (ref 4.0–10.5)

## 2015-12-31 LAB — COMPREHENSIVE METABOLIC PANEL
ALK PHOS: 89 U/L (ref 38–126)
ALT: 39 U/L (ref 14–54)
ANION GAP: 12 (ref 5–15)
AST: 32 U/L (ref 15–41)
Albumin: 4 g/dL (ref 3.5–5.0)
BUN: 16 mg/dL (ref 6–20)
CALCIUM: 9.6 mg/dL (ref 8.9–10.3)
CO2: 24 mmol/L (ref 22–32)
Chloride: 101 mmol/L (ref 101–111)
Creatinine, Ser: 0.77 mg/dL (ref 0.44–1.00)
GFR calc non Af Amer: >60 mL/min (ref >60)
Glucose, Bld: 132 mg/dL — ABNORMAL HIGH (ref 65–99)
POTASSIUM: 4.4 mmol/L (ref 3.5–5.1)
SODIUM: 137 mmol/L (ref 135–145)
Total Bilirubin: 0.4 mg/dL (ref 0.3–1.2)
Total Protein: 7.5 g/dL (ref 6.5–8.1)

## 2015-12-31 MED ORDER — ALBUTEROL SULFATE (2.5 MG/3ML) 0.083% IN NEBU
5.0000 mg | INHALATION_SOLUTION | Freq: Once | RESPIRATORY_TRACT | Status: AC
  Administered 2015-12-31: 5 mg via RESPIRATORY_TRACT
  Filled 2015-12-31: qty 6

## 2015-12-31 MED ORDER — LORAZEPAM 2 MG/ML IJ SOLN
2.0000 mg | Freq: Once | INTRAMUSCULAR | Status: AC | PRN
Start: 2015-12-31 — End: 2015-12-31
  Administered 2015-12-31: 2 mg via INTRAMUSCULAR
  Filled 2015-12-31: qty 1

## 2015-12-31 MED ORDER — OXYCODONE-ACETAMINOPHEN 5-325 MG PO TABS
2.0000 | ORAL_TABLET | Freq: Once | ORAL | Status: AC
  Administered 2015-12-31: 2 via ORAL
  Filled 2015-12-31: qty 2

## 2015-12-31 NOTE — ED Notes (Signed)
Patient complaining of wheezing, states she has asthma but left her inhaler at home.

## 2015-12-31 NOTE — Discharge Planning (Signed)
EDCM reviewed discharging chart for possible CM needs.  No needs identified.    

## 2015-12-31 NOTE — ED Notes (Signed)
Patient wet her panties a little so she asked if I could assist her with and incontinece brief.

## 2015-12-31 NOTE — ED Triage Notes (Signed)
Patient reports for about 1 week she has been incont of urine only at night.  States no trouble during the day.  Also c/o umbilical pain

## 2015-12-31 NOTE — Discharge Planning (Deleted)
  Follow-up appointment made?____yes, 10/4 @ 2:00 with Dr Amou______  Does patient have transportation Issues? (When/Time?)____no______  Medication needs?_____no___  Equipment Needs?____no___________                                       Oxygen Needs?____no___________  Do lines/drains need to be removed? If yes, then d/c at appropriate time?________no__________  PT equipment ordered?____no__________  H.H needed? ______no________                                                 H.H ordered?______no________

## 2015-12-31 NOTE — ED Provider Notes (Signed)
[facility] DEPT Provider Note   CSN: [id number] Arrival date & time: 12/31/15  0631     History   Chief Complaint Chief Complaint  Patient presents with  . Urinary Incontinence  . Hernia    HPI Tammy Hines is a 55 y.o. female.  HPI   Patient is a 55 year old female presents emergency Department with complaint of one week of urinary incontinence which occurs 2-3 times every evening. She wakes up actively urinating on herself. This is a new occurrence. No urinary sx during the daytime.  She denies dysuria, hematuria, urinary retention, urinary frequency or urgency. She is on Lasix am/pm for lower extremity edema and stopped taking this one week ago when she first had incontinence, with worsening lower extremity edema, associated pain and difficulty ambulating. She also reports a history of left hip arthritis, right knee pain, chronic low back pain for 17 years. She has no changes to any of his pain other than worsening bilateral lower extremity pain secondary to swelling.  She denies LE weakness, numbness or tingling. She denies saddle anesthesia, flank pain, pelvic pain, fever, chills, sweats, N, V, D, constipation.  She denies any other acute or associated symptoms. She does have a history of intermittent leaking of stool over the past 6-7 months, no change in the symptom in the last week.  She is concerned that she may have an umbilical hernia, and her belly button has changes and is mildly tender, with pain severity very mild.  No other abdominal pain, no bloating, no distention. She denies IV drug use, personal history of cancer, chronic steroid use.  She cannot recall what imaging she has had in the past for her spine but reports 17 years of chronic back pain.  Past Medical History:  Diagnosis Date  . Anxiety   . Bipolar 1 disorder (HCC)   . Hyperlipidemia   . Hypertension     Patient Active Problem List   Diagnosis Date Noted  . SORE THROAT 12/04/2008  . VIRAL URI  03/04/2008  . BLURRED VISION 10/22/2007  . FATIGUE 10/22/2007  . DISC DISEASE, LUMBAR 09/06/2007  . COUGH 04/20/2007  . DEPRESSION 11/19/2006  . ASTHMA 11/19/2006  . GERD 11/19/2006  . HEADACHE 11/19/2006  . HEPATITIS B, HX OF 11/19/2006    Past Surgical History:  Procedure Laterality Date  . ANAL FISSURE REPAIR    . SHOULDER ARTHROSCOPY Right    x2    OB History    No data available       Home Medications    Prior to Admission medications   Medication Sig Start Date End Date Taking? Authorizing Provider  albuterol (PROVENTIL HFA;VENTOLIN HFA) 108 (90 Base) MCG/ACT inhaler Inhale 2 puffs into the lungs every 6 (six) hours as needed for wheezing or shortness of breath.   Yes Historical Provider, MD  alprazolam Prudy Feeler) 2 MG tablet Take 2 mg by mouth 3 (three) times daily. 1 mg in the morning, 2 mg in the afternoon, 1 mg at night    Yes Historical Provider, MD  Asenapine Maleate (SAPHRIS) 10 MG SUBL Place 20 mg under the tongue at bedtime.   Yes Historical Provider, MD  atenolol (TENORMIN) 50 MG tablet Take 100 mg by mouth at bedtime. 12/13/15  Yes Historical Provider, MD  clomiPRAMINE (ANAFRANIL) 50 MG capsule Take 50 mg by mouth at bedtime. For OCD   Yes Historical Provider, MD  cyclobenzaprine (FLEXERIL) 10 MG tablet Take 10 mg by mouth 2 (two) times daily.  Yes Historical Provider, MD  dicyclomine (BENTYL) 10 MG capsule Take 10 mg by mouth 3 (three) times daily before meals.    Yes Historical Provider, MD  Diethylpropion HCl (TENUATE PO) Take 75 mg by mouth daily. For weight loss   Yes Historical Provider, MD  FLUoxetine (PROZAC) 40 MG capsule Take 80 mg by mouth daily.   Yes Historical Provider, MD  furosemide (LASIX) 20 MG tablet Take 60 mg by mouth 2 (two) times daily.   Yes Historical Provider, MD  hydrOXYzine (VISTARIL) 25 MG capsule Take 25 mg by mouth every 6 (six) hours as needed for anxiety or itching.    Yes Historical Provider, MD  ibuprofen (ADVIL,MOTRIN) 200 MG  tablet Take 600 mg by mouth every 6 (six) hours as needed (pain).   Yes Historical Provider, MD  Multiple Vitamins-Minerals (MULTIVITAMIN WITH MINERALS) tablet Take 1 tablet by mouth daily. Equate women's daily vitamins   Yes Historical Provider, MD  naproxen (NAPROSYN) 500 MG tablet Take 500 mg by mouth 2 (two) times daily. 12/13/15  Yes Historical Provider, MD  omeprazole (PRILOSEC) 20 MG capsule Take 40 mg by mouth daily.    Yes Historical Provider, MD  oxyCODONE-acetaminophen (PERCOCET) 10-325 MG tablet Take 1 tablet by mouth 3 (three) times daily. scheduled 12/13/15  Yes Historical Provider, MD  simvastatin (ZOCOR) 20 MG tablet Take 20 mg by mouth at bedtime.    Yes Historical Provider, MD    Family History Family History  Problem Relation Age of Onset  . Hypertension Mother   . Cancer Father     esophageal  . Heart attack Maternal Grandfather     Social History Social History  Substance Use Topics  . Smoking status: Current Every Day Smoker    Packs/day: 0.25    Years: 35.00    Types: Cigarettes  . Smokeless tobacco: Never Used  . Alcohol use No     Allergies   Ampicillin; Clonazepam; Other; Penicillins; and Bupropion hcl   Review of Systems Review of Systems  All other systems reviewed and are negative.    Physical Exam Updated Vital Signs BP 121/67   Pulse 73   Temp 98.6 F (37 C) (Oral)   Resp 20   Ht 5\' 7"  (1.702 m)   Wt (!) 137 kg   LMP  (LMP Unknown)   SpO2 92%   BMI 47.30 kg/m   Physical Exam  Constitutional: She is oriented to person, place, and time. Vital signs are normal. She appears well-developed and well-nourished. She is cooperative.  Non-toxic appearance. She does not have a sickly appearance. She does not appear ill. No distress.  Chronically ill appearing and obese female  HENT:  Head: Normocephalic and atraumatic.  Nose: Nose normal.  Mouth/Throat: Oropharynx is clear and moist. No oropharyngeal exudate.  Eyes: Conjunctivae and EOM are  normal. Pupils are equal, round, and reactive to light. Right eye exhibits no discharge. Left eye exhibits no discharge. No scleral icterus.  Neck: Normal range of motion. Neck supple.  Cardiovascular: Normal rate, regular rhythm, normal heart sounds and intact distal pulses.  Exam reveals no gallop and no friction rub.   No murmur heard. Pulmonary/Chest: Effort normal and breath sounds normal. No respiratory distress. She has no wheezes. She has no rales. She exhibits no tenderness.  Abdominal: Soft. Bowel sounds are normal. She exhibits no distension and no mass. There is no tenderness. There is no rebound and no guarding. A hernia (small umbilical hernia, not prolapsed, non-tender) is present.  No CVA tenderness  Musculoskeletal: Normal range of motion. She exhibits tenderness. She exhibits no edema.  Lymphadenopathy:    She has no cervical adenopathy.  Neurological: She is alert and oriented to person, place, and time. No cranial nerve deficit. She exhibits abnormal muscle tone (decreased rectal tone). Coordination normal.  Normal sensation bilaterally to light touch 5/5 strength bilaterally in lower show any with dorsiflexion and plantarflexion  Skin: Skin is warm and dry. No rash noted. She is not diaphoretic. No erythema. No pallor.  Psychiatric: She has a normal mood and affect. Her behavior is normal. Judgment and thought content normal.  Nursing note and vitals reviewed.    ED Treatments / Results  Labs (all labs ordered are listed, but only abnormal results are displayed) Labs Reviewed  COMPREHENSIVE METABOLIC PANEL - Abnormal; Notable for the following:       Result Value   Glucose, Bld 132 (*)    All other components within normal limits  URINALYSIS, ROUTINE W REFLEX MICROSCOPIC (NOT AT [facility]) - Abnormal; Notable for the following:    Specific Gravity, Urine <1.005 (*)    All other components within normal limits  CBC WITH DIFFERENTIAL/PLATELET    EKG  EKG  Interpretation None       Radiology Mr Lumbar Spine Wo Contrast  Result Date: 12/31/2015 CLINICAL DATA:  Umbilical pain. EXAM: MRI LUMBAR SPINE WITHOUT CONTRAST TECHNIQUE: Multiplanar, multisequence MR imaging of the lumbar spine was performed. No intravenous contrast was administered. COMPARISON:  None. FINDINGS: Segmentation:  Standard. Alignment:  Physiologic. Vertebrae:  No fracture, evidence of discitis, or bone lesion. Conus medullaris: Extends to the L1 level and appears normal. Paraspinal and other soft tissues: Negative. Disc levels: Disc spaces: Disc spaces are maintained. T12-L1: No significant disc bulge. No evidence of neural foraminal stenosis. No central canal stenosis. L1-L2: No significant disc bulge. No evidence of neural foraminal stenosis. No central canal stenosis. L2-L3: No significant disc bulge. No evidence of neural foraminal stenosis. No central canal stenosis. L3-L4: No significant disc bulge. Mild bilateral facet arthropathy. No evidence of neural foraminal stenosis. No central canal stenosis. L4-L5: No significant disc bulge. Mild bilateral facet arthropathy. No evidence of neural foraminal stenosis. No central canal stenosis. L5-S1: No significant disc bulge. No evidence of neural foraminal stenosis. No central canal stenosis. IMPRESSION: 1. No lumbar spine disc protrusion, foraminal stenosis or central canal stenosis. 2. Mild bilateral facet arthropathy at L3-4 and L4-5. Electronically Signed   By: Elige Ko   On: 12/31/2015 10:53    Procedures Procedures (including critical care time)  Medications Ordered in ED Medications  albuterol (PROVENTIL) (2.5 MG/3ML) 0.083% nebulizer solution 5 mg (5 mg Nebulization Given 12/31/15 0837)  LORazepam (ATIVAN) injection 2 mg (2 mg Intramuscular Given 12/31/15 0917)  oxyCODONE-acetaminophen (PERCOCET/ROXICET) 5-325 MG per tablet 2 tablet (2 tablets Oral Given 12/31/15 1132)     Initial Impression / Assessment and Plan / ED  Course  I have reviewed the triage vital signs and the nursing notes.  Pertinent labs & imaging results that were available during my care of the patient were reviewed by me and considered in my medical decision making (see chart for details).  Clinical Course  Patient with 1 week of nocturnal urinary incontinence, 6-7 month history of stool incontinence, in the setting of 17 years of chronic back pain. She has no other urinary symptoms concerning for UTI, no history of incontinence or neurogenic bladder. She otherwise has bilateral lower extremity edema, treated with Lasix  however she stopped this medication over the past week without any improvement to her incontinence.  She denies saddle anesthesia, IV drug use, chronic steroid use. She does have multiple medical problems including arthritis, psychiatric disease, hypertension, hyperlipidemia, chronic back pain with documented lumbar disc disease.  On exam she has a small umbilical hernia less than 3 cm wide and not prolapsed, nontender.  She has no abdominal tenderness or flank tenderness.  She has normal lower extremity sensation and strength. Digital rectal exam revealed decreased rectal tone.  Discussed with attending physician, Dr. Rolland Porter, he agrees with MRI of L & T spine.  Pt in agreeement  Patient's lab work is unremarkable, urinalysis obtained is negative for UTI.  Patient during her several hour stay did not feel as if she had to urinate at any time, but was able to when requested, and did have incontinence several times in the ER. Bladder scanner was pertinent for 544 mLs (after multiple voids and some incontinence.)  She is asked to 10 teaspoons a commode and she was rescanned with the remaining 386 mLs post-void.  Discussed with Dr. Fayrene Fearing, will need to send home with indwelling catheter and urology follow-up in one week for urinary retention.  This was reviewed with the patient at length, including Foley care and follow-up.  He was also  given to the patient printed form and reviewed further by nursing staff to the patient and her boyfriend who came to take her home.    She was discharged in good condition with stable vital signs.   Final Clinical Impressions(s) / ED Diagnoses   Final diagnoses:  Urinary incontinence  Overflow incontinence  Urinary retention    New Prescriptions Discharge Medication List as of 12/31/2015 12:57 PM       Danelle Berry, PA-C 12/31/15 1629    Rolland Porter, MD 01/04/16 [phone]

## 2016-01-01 NOTE — Telephone Encounter (Signed)
Spoke to patient she states she went to ER yesterday- DX with a stretched bladder  Patient [facility] to reduce dose to 2 tablet in the morning and 2 tablets in the evening. RN informed patient to follow instruction and also contact primary. Patient states she has been trying to contact primary- but no answer.

## 2016-02-07 LAB — BRAIN NATRIURETIC PEPTIDE: Brain Natriuretic Peptide: 66.5 pg/mL (ref <100)

## 2016-03-05 ENCOUNTER — Telehealth: Payer: Self-pay | Admitting: Cardiology

## 2016-03-05 NOTE — Telephone Encounter (Signed)
Spoke with pt states that she is a little SOB and she states that this is because of her weight she is "always SOB" pt declines appt. Pt states that she is taking all her medications as ordered. Pt states that she will not take anymore of her lasix. Pt does not have BP cuff to take her BP.

## 2016-03-05 NOTE — Telephone Encounter (Signed)
Pt would like her lab results from about 2 weeks ago please. She says her legs are also still swelling bad.

## 2016-03-05 NOTE — Telephone Encounter (Signed)
Spoke with pt tried to increase lasix she declined and states that she did that once and lost bladder control, especially at night she "wet the bed". Please advise  Pt is also calling for her lab result.

## 2016-03-06 NOTE — Telephone Encounter (Signed)
Pt informed of message from Dr Antoine PocheHochrein, verbalizes understanding. Spoke with pt states that she "gets nowhere" with PCP she states that she has eating disorder and a problem wetting herself and he will not call her back. She will try to approach again with PCP.

## 2016-03-06 NOTE — Telephone Encounter (Signed)
The patient should follow with her primary provider.

## 2016-03-26 ENCOUNTER — Telehealth: Payer: Self-pay | Admitting: Cardiology

## 2016-03-26 NOTE — Telephone Encounter (Signed)
Note 10/02/2015: in regards to LE edema "Rather I would suspect venous insufficiency complicated by her weight."  Patient states she keeps her elevated on 4 pillows most of the day. She has had swelling for the past 6-7 months.   She also states she never got the results of her lab work - BNP done 02/2016 (ordered June 2017) and states she was not told her echo results (result note from July states she was notified of normal echo).   Patient would like MD to order test to evaluate her legs, if appropriate. Will route to MD

## 2016-03-26 NOTE — Telephone Encounter (Signed)
Pt said she thought Dr [place]thaca LionsHochrien had wanted to do some type of test on her legs. She have not heard anything from him.Pt says she weights 290 lbs and wonder if that might be causing her legs to swell?

## 2016-03-27 NOTE — Telephone Encounter (Signed)
Patient notified of MD advice. 

## 2016-03-27 NOTE — Telephone Encounter (Signed)
She had a normal BNP and echo.  I was not planning further cardiac testing.

## 2016-04-07 HISTORY — PX: MULTIPLE TOOTH EXTRACTIONS: SHX2053

## 2016-04-15 ENCOUNTER — Emergency Department (HOSPITAL_COMMUNITY)
Admission: EM | Admit: 2016-04-15 | Discharge: 2016-04-15 | Disposition: A | Discharge status: Home / Self Care | Payer: Medicare Other | Attending: Emergency Medicine | Admitting: Emergency Medicine

## 2016-04-15 ENCOUNTER — Encounter (HOSPITAL_COMMUNITY): Payer: Self-pay | Admitting: Emergency Medicine

## 2016-04-15 DIAGNOSIS — R197 Diarrhea, unspecified: Secondary | ICD-10-CM | POA: Insufficient documentation

## 2016-04-15 DIAGNOSIS — R112 Nausea with vomiting, unspecified: Secondary | ICD-10-CM | POA: Insufficient documentation

## 2016-04-15 DIAGNOSIS — J45909 Unspecified asthma, uncomplicated: Secondary | ICD-10-CM | POA: Diagnosis not present

## 2016-04-15 DIAGNOSIS — I10 Essential (primary) hypertension: Secondary | ICD-10-CM | POA: Insufficient documentation

## 2016-04-15 DIAGNOSIS — F17210 Nicotine dependence, cigarettes, uncomplicated: Secondary | ICD-10-CM | POA: Diagnosis not present

## 2016-04-15 HISTORY — DX: Obesity, unspecified: E66.9

## 2016-04-15 LAB — COMPREHENSIVE METABOLIC PANEL
ALBUMIN: 3.3 g/dL — AB (ref 3.5–5.0)
ALT: 69 U/L — ABNORMAL HIGH (ref 14–54)
ANION GAP: 9 (ref 5–15)
AST: 60 U/L — ABNORMAL HIGH (ref 15–41)
Alkaline Phosphatase: 86 U/L (ref 38–126)
BUN: 12 mg/dL (ref 6–20)
CO2: 25 mmol/L (ref 22–32)
Calcium: 8.9 mg/dL (ref 8.9–10.3)
Chloride: 101 mmol/L (ref 101–111)
Creatinine, Ser: 0.77 mg/dL (ref 0.44–1.00)
GFR calc Af Amer: >60 mL/min (ref >60)
GFR calc non Af Amer: >60 mL/min (ref >60)
GLUCOSE: 111 mg/dL — AB (ref 65–99)
POTASSIUM: 3.8 mmol/L (ref 3.5–5.1)
SODIUM: 135 mmol/L (ref 135–145)
Total Bilirubin: 0.3 mg/dL (ref 0.3–1.2)
Total Protein: 6.2 g/dL — ABNORMAL LOW (ref 6.5–8.1)

## 2016-04-15 LAB — CBC
HEMATOCRIT: 34.8 % — AB (ref 36.0–46.0)
HEMOGLOBIN: 11.4 g/dL — AB (ref 12.0–15.0)
MCH: 28.9 pg (ref 26.0–34.0)
MCHC: 32.8 g/dL (ref 30.0–36.0)
MCV: 88.3 fL (ref 78.0–100.0)
Platelets: 226 10*3/uL (ref 150–400)
RBC: 3.94 MIL/uL (ref 3.87–5.11)
RDW: 13 % (ref 11.5–15.5)
WBC: 6.8 10*3/uL (ref 4.0–10.5)

## 2016-04-15 LAB — LIPASE, BLOOD: Lipase: 25 U/L (ref 11–51)

## 2016-04-15 LAB — C DIFFICILE QUICK SCREEN W PCR REFLEX
C DIFFICILE (CDIFF) TOXIN: NEGATIVE
C Diff antigen: NEGATIVE
C Diff interpretation: NOT DETECTED

## 2016-04-15 MED ORDER — ONDANSETRON HCL 4 MG PO TABS: 4.0000 mg | ORAL_TABLET | Freq: Four times a day (QID) | ORAL | 0 refills | Status: DC | PRN

## 2016-04-15 MED ORDER — LOPERAMIDE HCL 2 MG PO CAPS: 2.0000 mg | ORAL_CAPSULE | Freq: Four times a day (QID) | ORAL | 0 refills | Status: DC | PRN

## 2016-04-15 NOTE — ED Provider Notes (Signed)
[facility] Provider Note   CSN: [id number] Arrival date & time: 04/15/16  0108     History   Chief Complaint Chief Complaint  Patient presents with  . Emesis  . Diarrhea    HPI Tammy Hines is a 56 y.o. female.  The history is provided by the patient.  Diarrhea   This is a new problem. The current episode started more than 2 days ago. The problem occurs 5 to 10 times per day. The problem has not changed since onset.The stool consistency is described as watery. There has been no fever. Associated symptoms include vomiting. Pertinent negatives include no abdominal pain, no URI and no cough. She has tried nothing for the symptoms. Her past medical history does not include inflammatory bowel disease, bowel resection or recent abdominal surgery.   2 her old female who presents with nausea, vomiting, and diarrhea. She has a history of hypertension, hyperlipidemia, morbid obesity. History of anal fissure repair, but no prior abdominal surgeries. Reports three dental extractions 4 days ago and started on unknown antibiotic. Reports that night with episodic intermittent nonbilious nonbloody vomiting. 2 days ago with profuse non-bloody diarrhea. No abdominal pain, fever or chills. Baseline incontinence of urine since being on lasix, which has been unchanged. No recent travel. History of c. Diff she reports.    Past Medical History:  Diagnosis Date  . Anxiety   . Bipolar 1 disorder (HCC)   . Hyperlipidemia   . Hypertension   . Obesity     Patient Active Problem List   Diagnosis Date Noted  . SORE THROAT 12/04/2008  . VIRAL URI 03/04/2008  . BLURRED VISION 10/22/2007  . FATIGUE 10/22/2007  . DISC DISEASE, LUMBAR 09/06/2007  . COUGH 04/20/2007  . DEPRESSION 11/19/2006  . ASTHMA 11/19/2006  . GERD 11/19/2006  . HEADACHE 11/19/2006  . HEPATITIS B, HX OF 11/19/2006    Past Surgical History:  Procedure Laterality Date  . ANAL FISSURE REPAIR    . DENTAL SURGERY    .  SHOULDER ARTHROSCOPY Right    x2    OB History    No data available       Home Medications    Prior to Admission medications   Medication Sig Start Date End Date Taking? Authorizing Provider  albuterol (PROVENTIL HFA;VENTOLIN HFA) 108 (90 Base) MCG/ACT inhaler Inhale 2 puffs into the lungs every 6 (six) hours as needed for wheezing or shortness of breath.   Yes Historical Provider, MD  alprazolam Prudy Feeler) 2 MG tablet Take 2 mg by mouth 3 (three) times daily. 1 mg in the morning, 2 mg in the afternoon, 1 mg at night    Yes Historical Provider, MD  Asenapine Maleate (SAPHRIS) 10 MG SUBL Place 20 mg under the tongue at bedtime.   Yes Historical Provider, MD  atenolol (TENORMIN) 50 MG tablet Take 100 mg by mouth at bedtime. 12/13/15  Yes Historical Provider, MD  clindamycin (CLEOCIN) 150 MG capsule Take 150 mg by mouth 3 (three) times daily. 04/11/16 04/15/16 Yes Historical Provider, MD  clomiPRAMINE (ANAFRANIL) 50 MG capsule Take 50 mg by mouth at bedtime. For OCD   Yes Historical Provider, MD  dicyclomine (BENTYL) 10 MG capsule Take 10 mg by mouth 3 (three) times daily before meals.    Yes Historical Provider, MD  FLUoxetine (PROZAC) 40 MG capsule Take 80 mg by mouth daily.   Yes Historical Provider, MD  furosemide (LASIX) 20 MG tablet Take 40 mg by mouth 2 (two) times  daily.    Yes Historical Provider, MD  ibuprofen (ADVIL,MOTRIN) 200 MG tablet Take 600 mg by mouth every 6 (six) hours as needed (pain).   Yes Historical Provider, MD  Multiple Vitamins-Minerals (MULTIVITAMIN WITH MINERALS) tablet Take 1 tablet by mouth daily. Equate women's daily vitamins   Yes Historical Provider, MD  MYRBETRIQ 25 MG TB24 tablet Take 25 mg by mouth daily. 03/27/16  Yes Historical Provider, MD  naproxen (NAPROSYN) 500 MG tablet Take 500 mg by mouth 2 (two) times daily. 12/13/15  Yes Historical Provider, MD  omeprazole (PRILOSEC) 20 MG capsule Take 40 mg by mouth daily.    Yes Historical Provider, MD    oxyCODONE-acetaminophen (PERCOCET) 10-325 MG tablet Take 1 tablet by mouth 3 (three) times daily. scheduled 12/13/15  Yes Historical Provider, MD  simvastatin (ZOCOR) 20 MG tablet Take 20 mg by mouth at bedtime.    Yes Historical Provider, MD  cyclobenzaprine (FLEXERIL) 10 MG tablet Take 10 mg by mouth 2 (two) times daily.    Historical Provider, MD  Diethylpropion HCl (TENUATE PO) Take 75 mg by mouth daily. For weight loss    Historical Provider, MD  hydrOXYzine (VISTARIL) 25 MG capsule Take 25 mg by mouth every 6 (six) hours as needed for anxiety or itching.     Historical Provider, MD  loperamide (IMODIUM) 2 MG capsule Take 1 capsule (2 mg total) by mouth 4 (four) times daily as needed for diarrhea or loose stools. 04/15/16   Lavera Guiseana Duo Liu, MD  ondansetron (ZOFRAN) 4 MG tablet Take 1 tablet (4 mg total) by mouth every 6 (six) hours as needed for nausea or vomiting. 04/15/16   Lavera Guiseana Duo Liu, MD    Family History Family History  Problem Relation Age of Onset  . Hypertension Mother   . Cancer Father     esophageal  . Heart attack Maternal Grandfather     Social History Social History  Substance Use Topics  . Smoking status: Current Every Day Smoker    Packs/day: 0.25    Years: 35.00    Types: Cigarettes  . Smokeless tobacco: Never Used  . Alcohol use No     Allergies   Ampicillin; Clonazepam; Other; Penicillins; and Bupropion hcl   Review of Systems Review of Systems  Respiratory: Negative for cough.   Gastrointestinal: Positive for diarrhea and vomiting. Negative for abdominal pain.   10/14 systems reviewed and are negative other than those stated in the HPI   Physical Exam Updated Vital Signs BP 141/99 (BP Location: Left Arm)   Pulse 84   Temp 97.8 F (36.6 C) (Oral)   Resp 19   LMP  (LMP Unknown)   SpO2 97%   Physical Exam Physical Exam  Nursing note and vitals reviewed. Constitutional: Well developed, well nourished, non-toxic, and in no acute distress Head:  Normocephalic and atraumatic.  Mouth/Throat: Oropharynx is clear and moist.  Neck: Normal range of motion. Neck supple.  Cardiovascular: Normal rate and regular rhythm.   Pulmonary/Chest: Effort normal and breath sounds normal.  Abdominal: Soft. Obese. There is no tenderness. There is no rebound and no guarding.  Musculoskeletal: Normal range of motion.  Neurological: Alert, no facial droop, fluent speech, moves all extremities symmetrically Skin: Skin is warm and dry.  Psychiatric: Cooperative   ED Treatments / Results  Labs (all labs ordered are listed, but only abnormal results are displayed) Labs Reviewed  COMPREHENSIVE METABOLIC PANEL - Abnormal; Notable for the following:       Result  Value   Glucose, Bld 111 (*)    Total Protein 6.2 (*)    Albumin 3.3 (*)    AST 60 (*)    ALT 69 (*)    All other components within normal limits  CBC - Abnormal; Notable for the following:    Hemoglobin 11.4 (*)    HCT 34.8 (*)    All other components within normal limits  C DIFFICILE QUICK SCREEN W PCR REFLEX  LIPASE, BLOOD  URINALYSIS, ROUTINE W REFLEX MICROSCOPIC    EKG  EKG Interpretation None       Radiology No results found.  Procedures Procedures (including critical care time)  Medications Ordered in ED Medications - No data to display   Initial Impression / Assessment and Plan / ED Course  I have reviewed the triage vital signs and the nursing notes.  Pertinent labs & imaging results that were available during my care of the patient were reviewed by me and considered in my medical decision making (see chart for details).  Clinical Course     Presenting with nausea, vomiting, diarrhea over past 4 days. Non-toxic and in no acute distress. Appears well hydrated. Vital signs normal. Abdomen soft and non-tender. Drinking [employer] espresso shots in ED w/o difficulty per nurses, while she has been waiting in triage. Her blood work is reassuring without significant  leukocytosis, kidney injury, electrolyte or metabolic derangements. Will screen for recurrent c. Diff. If negative, will start supportive management.   C. Diff studies are negative. Discussed supportive care management. Finished with antibiotics today, which should help symptoms once finished. Strict return and follow-up instructions reviewed. She expressed understanding of all discharge instructions and felt comfortable with the plan of care.   Final Clinical Impressions(s) / ED Diagnoses   Final diagnoses:  Diarrhea, unspecified type  Nausea vomiting and diarrhea    New Prescriptions New Prescriptions   LOPERAMIDE (IMODIUM) 2 MG CAPSULE    Take 1 capsule (2 mg total) by mouth 4 (four) times daily as needed for diarrhea or loose stools.   ONDANSETRON (ZOFRAN) 4 MG TABLET    Take 1 tablet (4 mg total) by mouth every 6 (six) hours as needed for nausea or vomiting.     Lavera Guise, MD 04/15/16 1038

## 2016-04-15 NOTE — Discharge Instructions (Signed)
Please follow-up with your PCP for recheck before the weekend. Drink plenty of fluids and keep well hydrated. Take medications as prescribed. Review attached document regarding food choices that may help diarrhea. Avoid sugary or caffeinated drinks that can worsen diarrhea.  Return for worsening symptoms, including fever, severe abdominal pain, passing out or concern for dehydration, or any other symptoms concerning to you.

## 2016-04-15 NOTE — ED Notes (Signed)
Pt is at vending machine getting snacks and drinks. I told the pt she is not suppose to be eating or drinking anything. She is drinking a [employer] double-shot energy drink.

## 2016-04-15 NOTE — ED Triage Notes (Signed)
Patient reports emesis and diarrhea onset Friday , she is taking antibiotic and pain medication post dental extraction last week . Denies fever or chills .

## 2016-06-24 ENCOUNTER — Emergency Department (HOSPITAL_COMMUNITY): Payer: Medicare Other

## 2016-06-24 ENCOUNTER — Encounter (HOSPITAL_COMMUNITY): Payer: Self-pay | Admitting: Emergency Medicine

## 2016-06-24 ENCOUNTER — Observation Stay (HOSPITAL_COMMUNITY): Payer: Medicare Other

## 2016-06-24 ENCOUNTER — Observation Stay (HOSPITAL_BASED_OUTPATIENT_CLINIC_OR_DEPARTMENT_OTHER)
Admit: 2016-06-24 | Discharge: 2016-06-24 | Disposition: A | Discharge status: Home / Self Care | Payer: Medicare Other | Attending: Physician Assistant | Admitting: Physician Assistant

## 2016-06-24 ENCOUNTER — Inpatient Hospital Stay (HOSPITAL_COMMUNITY)
Admission: EM | Admit: 2016-06-24 | Discharge: 2016-06-27 | DRG: 041 | Disposition: A | Discharge status: Home / Self Care | Payer: Medicare Other | Attending: Internal Medicine | Admitting: Internal Medicine

## 2016-06-24 DIAGNOSIS — E785 Hyperlipidemia, unspecified: Secondary | ICD-10-CM

## 2016-06-24 DIAGNOSIS — Z880 Allergy status to penicillin: Secondary | ICD-10-CM

## 2016-06-24 DIAGNOSIS — F419 Anxiety disorder, unspecified: Secondary | ICD-10-CM

## 2016-06-24 DIAGNOSIS — I639 Cerebral infarction, unspecified: Secondary | ICD-10-CM | POA: Diagnosis not present

## 2016-06-24 DIAGNOSIS — R062 Wheezing: Secondary | ICD-10-CM

## 2016-06-24 DIAGNOSIS — Z6841 Body Mass Index (BMI) 40.0 and over, adult: Secondary | ICD-10-CM

## 2016-06-24 DIAGNOSIS — R29810 Facial weakness: Secondary | ICD-10-CM | POA: Diagnosis not present

## 2016-06-24 DIAGNOSIS — F329 Major depressive disorder, single episode, unspecified: Secondary | ICD-10-CM | POA: Diagnosis present

## 2016-06-24 DIAGNOSIS — I10 Essential (primary) hypertension: Secondary | ICD-10-CM | POA: Diagnosis present

## 2016-06-24 DIAGNOSIS — K219 Gastro-esophageal reflux disease without esophagitis: Secondary | ICD-10-CM | POA: Insufficient documentation

## 2016-06-24 DIAGNOSIS — F319 Bipolar disorder, unspecified: Secondary | ICD-10-CM | POA: Diagnosis present

## 2016-06-24 DIAGNOSIS — F603 Borderline personality disorder: Secondary | ICD-10-CM | POA: Diagnosis present

## 2016-06-24 DIAGNOSIS — Z888 Allergy status to other drugs, medicaments and biological substances status: Secondary | ICD-10-CM

## 2016-06-24 DIAGNOSIS — Z79899 Other long term (current) drug therapy: Secondary | ICD-10-CM

## 2016-06-24 DIAGNOSIS — F32A Depression, unspecified: Secondary | ICD-10-CM | POA: Diagnosis present

## 2016-06-24 DIAGNOSIS — F17210 Nicotine dependence, cigarettes, uncomplicated: Secondary | ICD-10-CM | POA: Diagnosis present

## 2016-06-24 DIAGNOSIS — F3170 Bipolar disorder, currently in remission, most recent episode unspecified: Secondary | ICD-10-CM | POA: Diagnosis not present

## 2016-06-24 DIAGNOSIS — Z9889 Other specified postprocedural states: Secondary | ICD-10-CM

## 2016-06-24 DIAGNOSIS — I6340 Cerebral infarction due to embolism of unspecified cerebral artery: Secondary | ICD-10-CM | POA: Diagnosis not present

## 2016-06-24 DIAGNOSIS — G8929 Other chronic pain: Secondary | ICD-10-CM | POA: Diagnosis present

## 2016-06-24 DIAGNOSIS — J45909 Unspecified asthma, uncomplicated: Secondary | ICD-10-CM | POA: Diagnosis present

## 2016-06-24 DIAGNOSIS — E784 Other hyperlipidemia: Secondary | ICD-10-CM | POA: Diagnosis not present

## 2016-06-24 DIAGNOSIS — M5137 Other intervertebral disc degeneration, lumbosacral region: Secondary | ICD-10-CM | POA: Diagnosis present

## 2016-06-24 HISTORY — DX: Personal history of transient ischemic attack (TIA), and cerebral infarction without residual deficits: Z86.73

## 2016-06-24 HISTORY — DX: Low back pain, unspecified: M54.50

## 2016-06-24 HISTORY — DX: Low back pain: M54.5

## 2016-06-24 HISTORY — DX: Other specified postprocedural states: R11.2

## 2016-06-24 HISTORY — DX: Personal history of other diseases of the digestive system: Z87.19

## 2016-06-24 HISTORY — DX: Unspecified osteoarthritis, unspecified site: M19.90

## 2016-06-24 HISTORY — DX: Unspecified viral hepatitis B without hepatic coma: B19.10

## 2016-06-24 HISTORY — DX: Other specified postprocedural states: Z98.890

## 2016-06-24 HISTORY — DX: Family history of other specified conditions: Z84.89

## 2016-06-24 HISTORY — DX: Cerebral infarction, unspecified: I63.9

## 2016-06-24 HISTORY — DX: Cervicalgia: M54.2

## 2016-06-24 HISTORY — DX: Fibromyalgia: M79.7

## 2016-06-24 HISTORY — DX: Gastro-esophageal reflux disease without esophagitis: K21.9

## 2016-06-24 HISTORY — DX: Chronic obstructive pulmonary disease, unspecified: J44.9

## 2016-06-24 HISTORY — DX: Other chronic pain: G89.29

## 2016-06-24 HISTORY — DX: Personal history of peptic ulcer disease: Z87.11

## 2016-06-24 HISTORY — DX: Unspecified asthma, uncomplicated: J45.909

## 2016-06-24 HISTORY — DX: Migraine, unspecified, not intractable, without status migrainosus: G43.909

## 2016-06-24 LAB — RAPID URINE DRUG SCREEN, HOSP PERFORMED
Amphetamines: NOT DETECTED
BENZODIAZEPINES: POSITIVE — AB
Barbiturates: NOT DETECTED
Cocaine: NOT DETECTED
OPIATES: NOT DETECTED
TETRAHYDROCANNABINOL: NOT DETECTED

## 2016-06-24 LAB — APTT: aPTT: 27 seconds (ref 24–36)

## 2016-06-24 LAB — TSH: TSH: 0.922 u[IU]/mL (ref 0.350–4.500)

## 2016-06-24 LAB — COMPREHENSIVE METABOLIC PANEL
ALT: 25 U/L (ref 14–54)
ANION GAP: 9 (ref 5–15)
AST: 23 U/L (ref 15–41)
Albumin: 3.5 g/dL (ref 3.5–5.0)
Alkaline Phosphatase: 73 U/L (ref 38–126)
BILIRUBIN TOTAL: 0.5 mg/dL (ref 0.3–1.2)
BUN: 18 mg/dL (ref 6–20)
CALCIUM: 9 mg/dL (ref 8.9–10.3)
CO2: 27 mmol/L (ref 22–32)
CREATININE: 0.86 mg/dL (ref 0.44–1.00)
Chloride: 100 mmol/L — ABNORMAL LOW (ref 101–111)
GFR calc Af Amer: >60 mL/min (ref >60)
Glucose, Bld: 109 mg/dL — ABNORMAL HIGH (ref 65–99)
POTASSIUM: 3.8 mmol/L (ref 3.5–5.1)
Sodium: 136 mmol/L (ref 135–145)
Total Protein: 6.2 g/dL — ABNORMAL LOW (ref 6.5–8.1)

## 2016-06-24 LAB — CBC
HEMATOCRIT: 39.2 % (ref 36.0–46.0)
HEMOGLOBIN: 12.4 g/dL (ref 12.0–15.0)
MCH: 27.6 pg (ref 26.0–34.0)
MCHC: 31.6 g/dL (ref 30.0–36.0)
MCV: 87.3 fL (ref 78.0–100.0)
Platelets: 228 10*3/uL (ref 150–400)
RBC: 4.49 MIL/uL (ref 3.87–5.11)
RDW: 13.7 % (ref 11.5–15.5)
WBC: 8.8 10*3/uL (ref 4.0–10.5)

## 2016-06-24 LAB — I-STAT TROPONIN, ED: Troponin i, poc: 0 ng/mL (ref 0.00–0.08)

## 2016-06-24 LAB — VAS US CAROTID
LCCADDIAS: -26 cm/s
LCCAPSYS: 106 cm/s
LEFT ECA DIAS: -13 cm/s
LEFT VERTEBRAL DIAS: 14 cm/s
LICADDIAS: -29 cm/s
LICADSYS: -91 cm/s
LICAPSYS: -64 cm/s
Left CCA dist sys: -79 cm/s
Left CCA prox dias: 22 cm/s
Left ICA prox dias: -18 cm/s
RIGHT ECA DIAS: -19 cm/s
RIGHT VERTEBRAL DIAS: 7 cm/s
Right CCA prox dias: 24 cm/s
Right CCA prox sys: 90 cm/s
Right cca dist sys: -69 cm/s

## 2016-06-24 LAB — I-STAT CHEM 8, ED
BUN: 19 mg/dL (ref 6–20)
CREATININE: 0.8 mg/dL (ref 0.44–1.00)
Calcium, Ion: 1.11 mmol/L — ABNORMAL LOW (ref 1.15–1.40)
Chloride: 102 mmol/L (ref 101–111)
GLUCOSE: 114 mg/dL — AB (ref 65–99)
HEMATOCRIT: 37 % (ref 36.0–46.0)
HEMOGLOBIN: 12.6 g/dL (ref 12.0–15.0)
POTASSIUM: 3.9 mmol/L (ref 3.5–5.1)
Sodium: 139 mmol/L (ref 135–145)
TCO2: 29 mmol/L (ref 0–100)

## 2016-06-24 LAB — URINALYSIS, ROUTINE W REFLEX MICROSCOPIC
BILIRUBIN URINE: NEGATIVE
Glucose, UA: NEGATIVE mg/dL
HGB URINE DIPSTICK: NEGATIVE
KETONES UR: NEGATIVE mg/dL
Leukocytes, UA: NEGATIVE
NITRITE: NEGATIVE
PH: 7 (ref 5.0–8.0)
Protein, ur: NEGATIVE mg/dL
Specific Gravity, Urine: 1.011 (ref 1.005–1.030)

## 2016-06-24 LAB — DIFFERENTIAL
BASOS PCT: 0 %
Basophils Absolute: 0 10*3/uL (ref 0.0–0.1)
EOS ABS: 0.2 10*3/uL (ref 0.0–0.7)
EOS PCT: 3 %
LYMPHS ABS: 2.6 10*3/uL (ref 0.7–4.0)
Lymphocytes Relative: 30 %
MONO ABS: 0.6 10*3/uL (ref 0.1–1.0)
MONOS PCT: 7 %
Neutro Abs: 5.3 10*3/uL (ref 1.7–7.7)
Neutrophils Relative %: 60 %

## 2016-06-24 LAB — CREATININE, SERUM: Creatinine, Ser: 0.86 mg/dL (ref 0.44–1.00)

## 2016-06-24 LAB — PROTIME-INR
INR: 1
Prothrombin Time: 13.2 seconds (ref 11.4–15.2)

## 2016-06-24 LAB — ETHANOL

## 2016-06-24 LAB — CBG MONITORING, ED: Glucose-Capillary: 131 mg/dL — ABNORMAL HIGH (ref 65–99)

## 2016-06-24 MED ORDER — POLYETHYLENE GLYCOL 3350 17 G PO PACK: 17.0000 g | PACK | Freq: Every day | ORAL | Status: DC | PRN

## 2016-06-24 MED ORDER — SIMVASTATIN 20 MG PO TABS: 20.0000 mg | ORAL_TABLET | Freq: Every day | ORAL | Status: DC

## 2016-06-24 MED ORDER — ASENAPINE MALEATE 10 MG SL SUBL: 20.0000 mg | SUBLINGUAL_TABLET | Freq: Every day | SUBLINGUAL | Status: DC

## 2016-06-24 MED ORDER — ENSURE ENLIVE PO LIQD
237.0000 mL | Freq: Two times a day (BID) | ORAL | Status: DC
  Administered 2016-06-25 (×2): 237 mL via ORAL

## 2016-06-24 MED ORDER — PANTOPRAZOLE SODIUM 40 MG PO TBEC: 40.0000 mg | DELAYED_RELEASE_TABLET | Freq: Every day | ORAL | Status: DC

## 2016-06-24 MED ORDER — OXYCODONE-ACETAMINOPHEN 5-325 MG PO TABS: 2.0000 | ORAL_TABLET | Freq: Once | ORAL | Status: DC

## 2016-06-24 MED ORDER — ONDANSETRON HCL 4 MG/2ML IJ SOLN
4.0000 mg | Freq: Four times a day (QID) | INTRAMUSCULAR | Status: DC | PRN
  Administered 2016-06-24 – 2016-06-25 (×4): 4 mg via INTRAVENOUS
  Filled 2016-06-24 (×4): qty 2

## 2016-06-24 MED ORDER — SIMVASTATIN 20 MG PO TABS
20.0000 mg | ORAL_TABLET | Freq: Every day | ORAL | Status: DC
  Administered 2016-06-24 – 2016-06-26 (×3): 20 mg via ORAL
  Filled 2016-06-24 (×4): qty 1

## 2016-06-24 MED ORDER — IPRATROPIUM-ALBUTEROL 0.5-2.5 (3) MG/3ML IN SOLN
3.0000 mL | Freq: Four times a day (QID) | RESPIRATORY_TRACT | Status: DC
  Administered 2016-06-24 (×2): 3 mL via RESPIRATORY_TRACT
  Filled 2016-06-24 (×2): qty 3

## 2016-06-24 MED ORDER — OXYCODONE-ACETAMINOPHEN 5-325 MG PO TABS
1.0000 | ORAL_TABLET | Freq: Three times a day (TID) | ORAL | Status: DC | PRN
  Administered 2016-06-24 – 2016-06-27 (×7): 1 via ORAL
  Filled 2016-06-24 (×8): qty 1

## 2016-06-24 MED ORDER — OXYCODONE-ACETAMINOPHEN 10-325 MG PO TABS: 1.0000 | ORAL_TABLET | Freq: Three times a day (TID) | ORAL | Status: DC

## 2016-06-24 MED ORDER — PANTOPRAZOLE SODIUM 40 MG PO TBEC
40.0000 mg | DELAYED_RELEASE_TABLET | Freq: Every day | ORAL | Status: DC
  Administered 2016-06-24 – 2016-06-27 (×5): 40 mg via ORAL
  Filled 2016-06-24 (×5): qty 1

## 2016-06-24 MED ORDER — OXYCODONE HCL 5 MG PO TABS
5.0000 mg | ORAL_TABLET | Freq: Three times a day (TID) | ORAL | Status: DC | PRN
  Administered 2016-06-24 – 2016-06-27 (×6): 5 mg via ORAL
  Filled 2016-06-24 (×7): qty 1

## 2016-06-24 MED ORDER — ALPRAZOLAM 0.25 MG PO TABS: 2.0000 mg | ORAL_TABLET | Freq: Three times a day (TID) | ORAL | Status: DC

## 2016-06-24 MED ORDER — BISACODYL 10 MG RE SUPP: 10.0000 mg | Freq: Every day | RECTAL | Status: DC | PRN

## 2016-06-24 MED ORDER — GABAPENTIN 400 MG PO CAPS: 400.0000 mg | ORAL_CAPSULE | Freq: Four times a day (QID) | ORAL | Status: DC

## 2016-06-24 MED ORDER — FESOTERODINE FUMARATE ER 8 MG PO TB24: 8.0000 mg | ORAL_TABLET | Freq: Every evening | ORAL | Status: DC

## 2016-06-24 MED ORDER — FUROSEMIDE 20 MG PO TABS: 20.0000 mg | ORAL_TABLET | ORAL | Status: DC

## 2016-06-24 MED ORDER — FLUOXETINE HCL 20 MG PO CAPS
40.0000 mg | ORAL_CAPSULE | Freq: Three times a day (TID) | ORAL | Status: DC
  Administered 2016-06-24 – 2016-06-27 (×8): 40 mg via ORAL
  Filled 2016-06-24 (×8): qty 2

## 2016-06-24 MED ORDER — ACETAMINOPHEN 325 MG PO TABS: 650.0000 mg | ORAL_TABLET | Freq: Four times a day (QID) | ORAL | Status: DC | PRN

## 2016-06-24 MED ORDER — CLOMIPRAMINE HCL 25 MG PO CAPS
50.0000 mg | ORAL_CAPSULE | Freq: Every day | ORAL | Status: DC
  Administered 2016-06-24 – 2016-06-26 (×3): 50 mg via ORAL
  Filled 2016-06-24 (×3): qty 2

## 2016-06-24 MED ORDER — ONDANSETRON HCL 4 MG PO TABS: 4.0000 mg | ORAL_TABLET | Freq: Four times a day (QID) | ORAL | Status: DC | PRN

## 2016-06-24 MED ORDER — ALPRAZOLAM 0.5 MG PO TABS
2.0000 mg | ORAL_TABLET | Freq: Three times a day (TID) | ORAL | Status: DC
Start: 2016-06-24 — End: 2016-06-27
  Administered 2016-06-24 – 2016-06-27 (×9): 2 mg via ORAL
  Filled 2016-06-24 (×8): qty 4
  Filled 2016-06-24: qty 8

## 2016-06-24 MED ORDER — ASENAPINE MALEATE 5 MG SL SUBL
20.0000 mg | SUBLINGUAL_TABLET | Freq: Every day | SUBLINGUAL | Status: DC
  Administered 2016-06-24 – 2016-06-26 (×3): 20 mg via SUBLINGUAL
  Filled 2016-06-24 (×3): qty 4

## 2016-06-24 MED ORDER — HEPARIN SODIUM (PORCINE) 5000 UNIT/ML IJ SOLN
5000.0000 [IU] | Freq: Three times a day (TID) | INTRAMUSCULAR | Status: DC
  Administered 2016-06-24 – 2016-06-27 (×7): 5000 [IU] via SUBCUTANEOUS
  Filled 2016-06-24 (×7): qty 1

## 2016-06-24 MED ORDER — IPRATROPIUM-ALBUTEROL 0.5-2.5 (3) MG/3ML IN SOLN
3.0000 mL | Freq: Four times a day (QID) | RESPIRATORY_TRACT | Status: DC | PRN
  Administered 2016-06-27: 3 mL via RESPIRATORY_TRACT
  Filled 2016-06-24: qty 3

## 2016-06-24 MED ORDER — LORAZEPAM 2 MG/ML IJ SOLN
INTRAMUSCULAR | Status: AC
  Filled 2016-06-24: qty 1

## 2016-06-24 MED ORDER — ACETAMINOPHEN 650 MG RE SUPP: 650.0000 mg | Freq: Four times a day (QID) | RECTAL | Status: DC | PRN

## 2016-06-24 MED ORDER — GABAPENTIN 400 MG PO CAPS
400.0000 mg | ORAL_CAPSULE | Freq: Four times a day (QID) | ORAL | Status: DC
  Administered 2016-06-24 – 2016-06-27 (×11): 400 mg via ORAL
  Filled 2016-06-24 (×11): qty 1

## 2016-06-24 MED ORDER — FESOTERODINE FUMARATE ER 8 MG PO TB24
8.0000 mg | ORAL_TABLET | Freq: Every evening | ORAL | Status: DC
  Administered 2016-06-25 – 2016-06-26 (×2): 8 mg via ORAL
  Filled 2016-06-24 (×4): qty 1

## 2016-06-24 MED ORDER — ASPIRIN 81 MG PO CHEW: 324.0000 mg | CHEWABLE_TABLET | Freq: Once | ORAL | Status: DC

## 2016-06-24 MED ORDER — CLOMIPRAMINE HCL 25 MG PO CAPS: 50.0000 mg | ORAL_CAPSULE | Freq: Every day | ORAL | Status: DC

## 2016-06-24 MED ORDER — FLUOXETINE HCL 40 MG PO CAPS: 40.0000 mg | ORAL_CAPSULE | Freq: Three times a day (TID) | ORAL | Status: DC

## 2016-06-24 MED ORDER — STROKE: EARLY STAGES OF RECOVERY BOOK
Freq: Once | Status: AC
  Administered 2016-06-25: 1
  Filled 2016-06-24: qty 1

## 2016-06-24 MED ORDER — LORAZEPAM 2 MG/ML IJ SOLN
1.0000 mg | Freq: Three times a day (TID) | INTRAMUSCULAR | Status: DC | PRN
  Administered 2016-06-24: 1 mg via INTRAVENOUS

## 2016-06-24 NOTE — ED Notes (Signed)
Report given.

## 2016-06-24 NOTE — ED Triage Notes (Signed)
Pt brought to Ed by [facility] from home for stroke like sx. LSW per pt yesterday at 1900 woke up today at 0100 with left side facial droop, pt states she was trying to take her medication she wasn't able to keep them on her mouth all med fell out. BP 128/100, HR 77, SPO2 95% cbg 166.

## 2016-06-24 NOTE — ED Notes (Signed)
Pt refused to use bed pan. Pt taken to the restroom via wheelchair. Ambulated with steady gait. Urine specimen collected. Pt placed back in bed on tele monitor. Family at bedside.

## 2016-06-24 NOTE — ED Notes (Signed)
Pt continues to chew gum and drink water despite telling her she is NPO; pt arguing with RN; speech call to see if could expedite her swallow eval

## 2016-06-24 NOTE — ED Provider Notes (Signed)
[facility]-EMERGENCY DEPT Provider Note   CSN: [id number] Arrival date & time: 06/24/16  0300  By signing my name below, I, Elder Negus, attest that this documentation has been prepared under the direction and in the presence of Shon Baton, MD. Electronically Signed: Elder Negus, Scribe. 06/24/16. 3:31 AM.   History   Chief Complaint Chief Complaint  Patient presents with  . Facial Droop    HPI Tammy Hines is a 56 y.o. female with history of HTN, HLD, bipolar disorder, and obesity who presents to the ED for evaluation of potential stroke symptoms. This patient states that she was in her usual health at 1900, 8.5 hours prior to arrival. Approximately 2.5 hours ago she woke from sleep and drank a glass of water. At this time she noticed R sided facial droop; "water was dripping out of my mouth". Also, decreased grip strength on Right and aphasia. She has never experienced these symptoms before. No decreased sensation. Denies history of stroke. She has been ambulatory. She otherwise denies any chest pain, dyspnea, cough, or recent illnesses.   Per nursing and EMS, her initial complaint was left facial droop. Nursing also reports inconsistent exam on initial evaluation.  The history is provided by the patient. No language interpreter was used.    Past Medical History:  Diagnosis Date  . Anxiety   . Bipolar 1 disorder (HCC)   . Hyperlipidemia   . Hypertension   . Obesity     Patient Active Problem List   Diagnosis Date Noted  . SORE THROAT 12/04/2008  . VIRAL URI 03/04/2008  . BLURRED VISION 10/22/2007  . FATIGUE 10/22/2007  . DISC DISEASE, LUMBAR 09/06/2007  . COUGH 04/20/2007  . DEPRESSION 11/19/2006  . ASTHMA 11/19/2006  . GERD 11/19/2006  . HEADACHE 11/19/2006  . HEPATITIS B, HX OF 11/19/2006    Past Surgical History:  Procedure Laterality Date  . ANAL FISSURE REPAIR    . DENTAL SURGERY    . SHOULDER ARTHROSCOPY Right    x2    OB History    No  data available       Home Medications    Prior to Admission medications   Medication Sig Start Date End Date Taking? Authorizing Provider  albuterol (PROVENTIL HFA;VENTOLIN HFA) 108 (90 Base) MCG/ACT inhaler Inhale 2 puffs into the lungs every 6 (six) hours as needed for wheezing or shortness of breath.    Historical Provider, MD  alprazolam Prudy Feeler) 2 MG tablet Take 2 mg by mouth 3 (three) times daily. 1 mg in the morning, 2 mg in the afternoon, 1 mg at night     Historical Provider, MD  Asenapine Maleate (SAPHRIS) 10 MG SUBL Place 20 mg under the tongue at bedtime.    Historical Provider, MD  atenolol (TENORMIN) 50 MG tablet Take 100 mg by mouth at bedtime. 12/13/15   Historical Provider, MD  clomiPRAMINE (ANAFRANIL) 50 MG capsule Take 50 mg by mouth at bedtime. For OCD    Historical Provider, MD  cyclobenzaprine (FLEXERIL) 10 MG tablet Take 10 mg by mouth 2 (two) times daily.    Historical Provider, MD  dicyclomine (BENTYL) 10 MG capsule Take 10 mg by mouth 3 (three) times daily before meals.     Historical Provider, MD  Diethylpropion HCl (TENUATE PO) Take 75 mg by mouth daily. For weight loss    Historical Provider, MD  FLUoxetine (PROZAC) 40 MG capsule Take 80 mg by mouth daily.    Historical Provider, MD  furosemide (  LASIX) 20 MG tablet Take 40 mg by mouth 2 (two) times daily.     Historical Provider, MD  hydrOXYzine (VISTARIL) 25 MG capsule Take 25 mg by mouth every 6 (six) hours as needed for anxiety or itching.     Historical Provider, MD  ibuprofen (ADVIL,MOTRIN) 200 MG tablet Take 600 mg by mouth every 6 (six) hours as needed (pain).    Historical Provider, MD  loperamide (IMODIUM) 2 MG capsule Take 1 capsule (2 mg total) by mouth 4 (four) times daily as needed for diarrhea or loose stools. 04/15/16   Lavera Guise, MD  Multiple Vitamins-Minerals (MULTIVITAMIN WITH MINERALS) tablet Take 1 tablet by mouth daily. Equate women's daily vitamins    Historical Provider, MD  MYRBETRIQ 25 MG  TB24 tablet Take 25 mg by mouth daily. 03/27/16   Historical Provider, MD  naproxen (NAPROSYN) 500 MG tablet Take 500 mg by mouth 2 (two) times daily. 12/13/15   Historical Provider, MD  omeprazole (PRILOSEC) 20 MG capsule Take 40 mg by mouth daily.     Historical Provider, MD  ondansetron (ZOFRAN) 4 MG tablet Take 1 tablet (4 mg total) by mouth every 6 (six) hours as needed for nausea or vomiting. 04/15/16   Lavera Guise, MD  oxyCODONE-acetaminophen (PERCOCET) 10-325 MG tablet Take 1 tablet by mouth 3 (three) times daily. scheduled 12/13/15   Historical Provider, MD  simvastatin (ZOCOR) 20 MG tablet Take 20 mg by mouth at bedtime.     Historical Provider, MD    Family History Family History  Problem Relation Age of Onset  . Hypertension Mother   . Cancer Father     esophageal  . Heart attack Maternal Grandfather     Social History Social History  Substance Use Topics  . Smoking status: Current Every Day Smoker    Packs/day: 0.25    Years: 35.00    Types: Cigarettes  . Smokeless tobacco: Never Used  . Alcohol use No     Allergies   Ampicillin; Clonazepam; Other; Penicillins; and Bupropion hcl   Review of Systems Review of Systems  Constitutional: Negative for fever.  Respiratory: Negative for cough and shortness of breath.   Cardiovascular: Negative for chest pain.  Neurological: Positive for facial asymmetry and weakness. Negative for numbness.  All other systems reviewed and are negative.    Physical Exam Updated Vital Signs BP 102/65   Pulse 69   Temp 98.3 F (36.8 C) (Oral)   Resp 15   Ht 5\' 6"  (1.676 m)   Wt (!) 302 lb (137 kg)   LMP  (LMP Unknown)   SpO2 94%   BMI 48.74 kg/m   Physical Exam  Constitutional: She appears well-developed and well-nourished.  Obese  HENT:  Head: Normocephalic and atraumatic.  Nasolabial full flattening on the right with some dysarthria, does not involve the forehead  Eyes: EOM are normal. Pupils are equal, round, and reactive  to light.  Neck: Neck supple.  Cardiovascular: Normal rate, regular rhythm and normal heart sounds.   No murmur heard. Pulmonary/Chest: Effort normal and breath sounds normal. No respiratory distress. She has no wheezes.  Abdominal: Soft. There is no tenderness.  Neurological: She is alert.  Droop noted as above, does not involve the forehead, dysarthria, patient otherwise can name and repeat, other cranial nerves are intact, 4+ out of 5 right grip strength, 5+ out of 5 biceps, triceps, deltoid strength bilaterally, 2+ patellar reflexes bilaterally, no drift noted, no dysmetria to finger-nose-finger, 5  out of 5 strength bilateral lower extremities  Skin: Skin is warm and dry.  Psychiatric: She has a normal mood and affect.  Nursing note and vitals reviewed.    ED Treatments / Results  Labs (all labs ordered are listed, but only abnormal results are displayed) Labs Reviewed  COMPREHENSIVE METABOLIC PANEL - Abnormal; Notable for the following:       Result Value   Chloride 100 (*)    Glucose, Bld 109 (*)    Total Protein 6.2 (*)    All other components within normal limits  CBG MONITORING, ED - Abnormal; Notable for the following:    Glucose-Capillary 131 (*)    All other components within normal limits  I-STAT CHEM 8, ED - Abnormal; Notable for the following:    Glucose, Bld 114 (*)    Calcium, Ion 1.11 (*)    All other components within normal limits  ETHANOL  PROTIME-INR  APTT  CBC  DIFFERENTIAL  RAPID URINE DRUG SCREEN, HOSP PERFORMED  URINALYSIS, ROUTINE W REFLEX MICROSCOPIC  I-STAT TROPOININ, ED    EKG  EKG Interpretation  Date/Time:  Tuesday June 24 2016 03:05:50 EDT Ventricular Rate:  76 PR Interval:    QRS Duration: 105 QT Interval:  406 QTC Calculation: 457 R Axis:   -24 Text Interpretation:  Sinus rhythm Borderline left axis deviation Low voltage, precordial leads Confirmed by Kemontae Dunklee  MD, Frenchie Dangerfield ([id number]) on 06/24/2016 4:10:35 AM       Radiology Ct  Head Wo Contrast  Result Date: 06/24/2016 CLINICAL DATA:  56 year old female with right facial droop. EXAM: CT HEAD WITHOUT CONTRAST TECHNIQUE: Contiguous axial images were obtained from the base of the skull through the vertex without intravenous contrast. COMPARISON:  None. FINDINGS: Brain: No evidence of acute infarction, hemorrhage, hydrocephalus, extra-axial collection or mass lesion/mass effect. Vascular: No hyperdense vessel or unexpected calcification. Skull: Normal. Negative for fracture or focal lesion. Sinuses/Orbits: No acute finding. Other: None IMPRESSION: No acute intracranial pathology. Electronically Signed   By: Elgie CollardArash  Radparvar M.D.   On: 06/24/2016 04:07    Procedures Procedures (including critical care time)  Medications Ordered in ED Medications - No data to display   Initial Impression / Assessment and Plan / ED Course  I have reviewed the triage vital signs and the nursing notes.  Pertinent labs & imaging results that were available during my care of the patient were reviewed by me and considered in my medical decision making (see chart for details).    Patient presents with right-sided facial droop and right upper extremity weakness. Initially EMS reported left facial droop. Some of her neuro exam is inconsistent; however, on my exam she appears to have a right facial droop that spares the forehead and slightly reduced grip strength on the right. Her last seen normal is greater than 8 hours ago. She is out of the window for TPA. Stroke workup initiated. CT reassuring and lab work is reassuring. Will obtain MRI.  Final Clinical Impressions(s) / ED Diagnoses   Final diagnoses:  Facial droop    New Prescriptions New Prescriptions   No medications on file   I personally performed the services described in this documentation, which was scribed in my presence. The recorded information has been reviewed and is accurate.    Shon Batonourtney F Vonnie Ligman, MD 06/24/16 [phone]

## 2016-06-24 NOTE — Evaluation (Signed)
Clinical/Bedside Swallow Evaluation Patient Details  Name: Tammy JarvisGretchen K Virrueta MRN: [id number] Date of Birth: 01-Jul-1960  Today's Date: 06/24/2016 Time: SLP Start Time (ACUTE ONLY): 1210 SLP Stop Time (ACUTE ONLY): 1223 SLP Time Calculation (min) (ACUTE ONLY): 13 min  Past Medical History:  Past Medical History:  Diagnosis Date  . Anxiety   . Bipolar 1 disorder (HCC)   . Hyperlipidemia   . Hypertension   . Obesity    Past Surgical History:  Past Surgical History:  Procedure Laterality Date  . ANAL FISSURE REPAIR    . DENTAL SURGERY    . SHOULDER ARTHROSCOPY Right    x932   HPI:  56 year old female with hx bipolar/anxiety, HTN, obesity came to ED with dysarthria, right facial asymmetry.  MRI Small patchy areas of acute infarct in the left posterior frontal lobe.   Assessment / Plan / Recommendation Clinical Impression  Pt presents with focal CN deficits right VII, XII; speech is dysarthric.  She reports difficulty eating dry/solid foods at baseline given her xerostomia secondary to meds.  Pt tolerated three oz water test - no concerns for aspiration.  Demonstrated adequate oral control with no noted leakage as she described previously.  Regular solids combined with liquids elicited immediate cough; pt verbalized anxiety re: eating certain foods.  For now, start a full liquid diet per pt's requests.  SLP will follow for overall safety/toleration, diet progression, and language/speech evaluation once pt is transferred to the floor from ED.  SLP Visit Diagnosis: Dysphagia, unspecified (R13.10)    Aspiration Risk  Mild aspiration risk    Diet Recommendation   full liquids  Medication Administration: Whole meds with puree    Other  Recommendations Oral Care Recommendations: Oral care BID   Follow up Recommendations  (tba)      Frequency and Duration min 2x/week  1 week       Prognosis        Swallow Study   General Date of Onset: 06/23/16 HPI: 56 year old female came to  ED with dysarthria, right facial asymmetry.  MRI Small patchy areas of acute infarct in the left posterior frontal lobe. Type of Study: Bedside Swallow Evaluation Previous Swallow Assessment: no Diet Prior to this Study: NPO Temperature Spikes Noted: No Respiratory Status: Room air History of Recent Intubation: No Behavior/Cognition: Alert;Cooperative Oral Cavity Assessment: Within Functional Limits Oral Care Completed by SLP: No Oral Cavity - Dentition: Adequate natural dentition Vision: Functional for self-feeding Self-Feeding Abilities: Able to feed self Patient Positioning: Upright in bed Baseline Vocal Quality: Normal Volitional Cough: Strong Volitional Swallow: Able to elicit    Oral/Motor/Sensory Function Overall Oral Motor/Sensory Function: Mild impairment Facial ROM: Reduced right;Suspected CN VII (facial) dysfunction Facial Symmetry: Abnormal symmetry right;Suspected CN VII (facial) dysfunction Facial Strength: Reduced right;Suspected CN VII (facial) dysfunction Lingual Symmetry: Abnormal symmetry right;Suspected CN XII (hypoglossal) dysfunction   Ice Chips Ice chips: Within functional limits   Thin Liquid Thin Liquid: Within functional limits Presentation: Cup Other Comments: passed three oz water challenge    Nectar Thick Nectar Thick Liquid: Not tested   Honey Thick Honey Thick Liquid: Not tested   Puree Puree: Within functional limits   Solid   GO   Solid: Impaired Presentation: Self Fed Pharyngeal Phase Impairments: Cough - Immediate        Blenda MountsCouture, Katelee Schupp Laurice 06/24/2016,12:29 PM

## 2016-06-24 NOTE — ED Notes (Addendum)
[facility]/o LEFT sided facial droop that she noted upon waking this morning at 1AM. She was last seen normal by significant other at 1900 06/23/16. Pt called 911 at 2AM. EMS appreciated L sided facial droop on scene and en route, also reports that patient was attempting to take her pills and noted that she cannot swallow (thus prompting 911 call). Upon arrival with patient to ED, pt now exhibits RIGHT sided facial droop. Pt states she cannot grip items with her right hand. RN to consult MD prior to placing orders per protocol due to change in presentation.  Per EMS, no neurological deficits noted en route aside from LEFT facial droop. States equal grip strengths. EMS reports that as soon as pt entered ED, presentation of droop changed to RIGHT side.   Hx bipolar and personality disorder. Pt reports new allergy to medicine but she cannot describe what the medicine is for or what it is called.

## 2016-06-24 NOTE — Progress Notes (Signed)
*  PRELIMINARY RESULTS* Vascular Ultrasound Carotid Duplex (Doppler) has been completed.  Preliminary findings: Bilateral: No significant (1-39%) ICA stenosis. Antegrade vertebral flow.     Farrel DemarkJill Eunice, RDMS, RVT  06/24/2016, 3:46 PM

## 2016-06-24 NOTE — ED Provider Notes (Signed)
8:43 AM pt received in signout from Dr. Wilkie AyeHorton- MRI shows area of acute infarct in left posterior frontal cortex and white matter, no hemmorhage.  Will consult neurology, giving aspirin and will arrange for admission to medicine as well.     Jerelyn ScottMartha Linker, MD 06/24/16 [phone]

## 2016-06-24 NOTE — ED Notes (Signed)
Patient transported to MRI 

## 2016-06-24 NOTE — Consult Note (Signed)
Neurology Consult Note  Reason for Consultation: Stroke on MRI  Requesting provider: Jerelyn Scott, MD  CC: R arm weakness, R facial droop, slurred speech  HPI: This is a 55-yo woman who presented to the ED for the evaluation of facial droop. She was last known to be normal at 1900 on 06/23/16. She apparently woke up at about 0100 this morning to get a drink of water at which time she noted that she had a R facial droop and that the water was running out of her mouth. She also noticed that she was having some slurred speech that was initially bad enough that people were having difficulty understanding her. She was unable to hold and pick up things with her right hand and had difficulty getting dressed so she could come to the hospital. They called 911 and she was brought to the ED for evaluation. Since arrival in the ED, all of her symptoms have improved. MRI of the brain was obtained and showed acute ischemic stroke. Neurology is now consulted for further management of her stroke.   Last known well: 1900 on  06/23/16 NHISS score: 3 (facial droop, R sensory loss, dysarthria) tPA given?: No, out of window with complete stroke on MRI   PMH:  Past Medical History:  Diagnosis Date  . Anxiety   . Bipolar 1 disorder (HCC)   . Hyperlipidemia   . Hypertension   . Obesity     PSH:  Past Surgical History:  Procedure Laterality Date  . ANAL FISSURE REPAIR    . DENTAL SURGERY    . SHOULDER ARTHROSCOPY Right    x2    Family history: Family History  Problem Relation Age of Onset  . Hypertension Mother   . Cancer Father     esophageal  . Heart attack Maternal Grandfather     Social history:  Social History   Social History  . Marital status: Single    Spouse name: N/A  . Number of children: N/A  . Years of education: N/A   Occupational History  . Not on file.   Social History Main Topics  . Smoking status: Current Every Day Smoker    Packs/day: 0.25    Years: 35.00    Types:  Cigarettes  . Smokeless tobacco: Never Used  . Alcohol use No  . Drug use: No  . Sexual activity: Not on file   Other Topics Concern  . Not on file   Social History Narrative   Lives alone.      Current inpatient meds: Medications reviewed and reconciled Current Facility-Administered Medications  Medication Dose Route Frequency Provider Last Rate Last Dose  . aspirin chewable tablet 324 mg  324 mg Oral Once Jerelyn Scott, MD       Current Outpatient Prescriptions  Medication Sig Dispense Refill  . albuterol (PROVENTIL HFA;VENTOLIN HFA) 108 (90 Base) MCG/ACT inhaler Inhale 2 puffs into the lungs every 6 (six) hours as needed for wheezing or shortness of breath.    . alprazolam (XANAX) 2 MG tablet Take 2 mg by mouth 3 (three) times daily. 1 mg in the morning, 2 mg in the afternoon, 1 mg at night     . Asenapine Maleate (SAPHRIS) 10 MG SUBL Place 20 mg under the tongue at bedtime.    Marland Kitchen atenolol (TENORMIN) 50 MG tablet Take 100 mg by mouth at bedtime.    . clomiPRAMINE (ANAFRANIL) 50 MG capsule Take 50 mg by mouth at bedtime. For OCD    .  cyclobenzaprine (FLEXERIL) 10 MG tablet Take 10 mg by mouth 2 (two) times daily.    Marland Kitchen dicyclomine (BENTYL) 10 MG capsule Take 10 mg by mouth 3 (three) times daily before meals.     . Diethylpropion HCl (TENUATE PO) Take 75 mg by mouth daily. For weight loss    . FLUoxetine (PROZAC) 40 MG capsule Take 80 mg by mouth daily.    . furosemide (LASIX) 20 MG tablet Take 40 mg by mouth 2 (two) times daily.     . hydrOXYzine (VISTARIL) 25 MG capsule Take 25 mg by mouth every 6 (six) hours as needed for anxiety or itching.     Marland Kitchen ibuprofen (ADVIL,MOTRIN) 200 MG tablet Take 600 mg by mouth every 6 (six) hours as needed (pain).    Marland Kitchen loperamide (IMODIUM) 2 MG capsule Take 1 capsule (2 mg total) by mouth 4 (four) times daily as needed for diarrhea or loose stools. 12 capsule 0  . Multiple Vitamins-Minerals (MULTIVITAMIN WITH MINERALS) tablet Take 1 tablet by mouth  daily. Equate women's daily vitamins    . MYRBETRIQ 25 MG TB24 tablet Take 25 mg by mouth daily.    . naproxen (NAPROSYN) 500 MG tablet Take 500 mg by mouth 2 (two) times daily.    Marland Kitchen omeprazole (PRILOSEC) 20 MG capsule Take 40 mg by mouth daily.     . ondansetron (ZOFRAN) 4 MG tablet Take 1 tablet (4 mg total) by mouth every 6 (six) hours as needed for nausea or vomiting. 12 tablet 0  . oxyCODONE-acetaminophen (PERCOCET) 10-325 MG tablet Take 1 tablet by mouth 3 (three) times daily. scheduled    . simvastatin (ZOCOR) 20 MG tablet Take 20 mg by mouth at bedtime.       Allergies: Allergies  Allergen Reactions  . Ampicillin Hives and Nausea And Vomiting    Has patient had a PCN reaction causing immediate rash, facial/tongue/throat swelling, SOB or lightheadedness with hypotension: No Has patient had a PCN reaction causing severe rash involving mucus membranes or skin necrosis: No Has patient had a PCN reaction that required hospitalization No Has patient had a PCN reaction occurring within the last 10 years: No If all of the above answers are "NO", then may proceed with Cephalosporin use.   Marland Kitchen Cleocin [Clindamycin Hcl]     "deathly sick"  . Clonazepam Other (See Comments)    Pt does not remember what the reaction was  . Other Other (See Comments)    Allergy to mold, down and feathers per allergy test  . Penicillins Other (See Comments)    Has patient had a PCN reaction causing immediate rash, facial/tongue/throat swelling, SOB or lightheadedness with hypotension: No Has patient had a PCN reaction causing severe rash involving mucus membranes or skin necrosis: No Has patient had a PCN reaction that required hospitalization No Has patient had a PCN reaction occurring within the last 10 years: No If all of the above answers are "NO", then may proceed with Cephalosporin use.   Pt told not to take because of reaction to ampicillin - no  . Bupropion Hcl Itching and Anxiety    ROS: As per  HPI. A full 14-point review of systems was performed and is otherwise notable for chronic dry mouth due to her medications. She has had some wheezing this morning. She has chronic constipation alternating with diarrhea. She has had some nausea today, no vomiting. She has chronic back and neck pain. Remainder of ROS is negative.   PE:  BP  96/76   Pulse 73   Temp 98.3 F (36.8 C) (Oral)   Resp 18   Ht 5\' 6"  (1.676 m)   Wt (!) 137 kg (302 lb)   LMP  (LMP Unknown)   SpO2 98%   BMI 48.74 kg/m   General: WD obese Caucasian woman lying on ED gurney, no acute distress. AAO x4. Speech slightly dysarthric but completely intelligible. No aphasia. Follows commands briskly. Affect is bright with congruent mood. Comportment is normal.  HEENT: Normocephalic. Neck supple without LAD. MMM, OP clear. Dentition good. Sclerae anicteric. No conjunctival injection.  CV: Regular, no murmur. Carotid pulses full and symmetric, no bruits. Distal pulses 2+ and symmetric.  Lungs: CTAB.  Abdomen: Soft, obese, non-distended, non-tender. Bowel sounds present x4.  Extremities: No C/C/E. Neuro:  CN: Pupils are equal and round. They are symmetrically reactive from 3-->2 mm. EOMI without nystagmus. No reported diplopia. Facial sensation is intact to light touch but decreased to pinprick on the R. She has a mild R central VII pattern of weakness. Hearing is intact to conversational voice. Palate elevates symmetrically and uvula is midline. Voice is normal in tone, pitch and quality. Bilateral SCM and trapezii are 5/5. Tongue is deviates slightly to the R with protrution.  Motor: Normal bulk, tone. She has 4+/5 strength in the extensors of the RUE and R grip. Strength is otherwise normal. No tremor or other abnormal movements. No drift.  Sensation: Intact to light touch. Pinprick is slightly reduced on the R, more over the leg than the arm. DTRs: 2+, symmetric. Toes downgoing on the L, upgoing on the R.  Coordination:  Finger-to-nose and heel-to-shin are without dysmetria but a little slower on the R. Finger taps are slower on the R than the L.    Labs:  Lab Results  Component Value Date   WBC 8.8 06/24/2016   HGB 12.6 06/24/2016   HCT 37.0 06/24/2016   PLT 228 06/24/2016   GLUCOSE 114 (H) 06/24/2016   ALT 25 06/24/2016   AST 23 06/24/2016   NA 139 06/24/2016   K 3.9 06/24/2016   CL 102 06/24/2016   CREATININE 0.80 06/24/2016   BUN 19 06/24/2016   CO2 27 06/24/2016   TSH 0.77 10/22/2007   INR 1.00 06/24/2016   HGBA1C 5.6 10/22/2007   EtOH <5 PTT 27 Troponin 0.00 Urine drug screen positive for benzos UA negative   Imaging: I have personally and independently reviewed the MRI brain without contrast from today. This shows patchy restricted diffusion involving the lateral aspect of the left frontal lobe. There is associated faint T2/FLAIR hyperintensity in the same area. This is consistent with an acute ischemic stroke.   Other diagnostic studies:  TTE 10/30/15: Normal LV size and thickness with EF 65-70%; normal wall motion; normal LV diastolic function; mild dilation of the L atrium and R atrium  Assessment and Plan:  1. Acute Ischemic Stroke: This is an acute stroke involving the L MCA territory. It is most likely embolic in etiology given MRI appearance. Known risk factors for cerebrovascular disease in this patient include HTN, hyperlipidemia, obesity and tobacco abuse. Additional workup will be ordered to include MRA of the head, carotid Dopplers, TTE, fasting lipids, and hemoglobin a1c. Further testing will be determined by results from these initial studies. Recommend antiplatelet therapy with aspirin 81 mg for secondary stroke prevention once cleared to take oral medications. Consider statin with goal LDL less than 70. Ensure adequate glucose control. Allow permissive hypertension in the  acute phase, treating only SBP greater than 220 mmHg and/or DBP greater than 110 mmHg. Avoid fever and  hyperglycemia as these can extend the infarct. Initiate rehab services. DVT prophylaxis as needed.   2. R hemiparesis: This is limited to the arm and face. This is acute, due to acute stroke. PT/OT as needed.   3. R hemisensory loss: This is acute, due to stroke. PT/OT/rehab as needed.   4. Dysarthria: This is acute and mild, due to stroke. SLP to evaluate and treat. NPO until passes swallow.   The patient would likely benefit from acute rehab services. Recommend consultation of PT/OT/SLP with consideration for PM&R consult as appropriate depending upon clinical progress and therapists' recommendations.   Fall risk: Risk factors for falls include use of psychotropic/sedative/hypnotic medications, polypharmacy, and acute R hemisensory loss. Fall precautions. Limit psychoactive medications and sedating medications.  Delirium risk: Risk factors for delirium include history of bipolar disorder, acute stroke, medications/polypharmacy. Optimize metabolic status as needed. Treat any infection aggressively. Minimize the use of opiates, benzos or any medication with strong anticholinergic properties as much as possible. Optimize sleep-wake cycles as much as you can by keeping the room bright with activity during the day and dark and quiet at night.   Needs outpatient neurology follow-up?: Outpatient f/u can be arranged at the time of discharge.   This was discussed with the patient and family. Education was provided on the diagnosis and expected evaluation and treatment. They are in agreement with the plan as noted. They were given the opportunity to ask any questions and these were addressed to their satisfaction.   Thank you for this consultation. The stroke team will assume care of the patient beginning 06/25/16. Please call if there are any urgent questions or concerns.

## 2016-06-24 NOTE — H&P (Signed)
History and Physical    Tammy Hines ZOX:[id number] DOB: November 21, 1960 DOA: 06/24/2016  PCP: Quitman Livings, MD   Patient coming from: home  Chief Complaint: Chocking, right sided facial droop and right sided weakness  HPI: Tammy Hines is a 56 y.o. female with medical history significant of severe anxiety, depression, borderline personality / bipolar disorder 1 , HTN, HLD, GERD, asthma who presented to the ED with c/o sudden onset right sided facial droop, right hand weakness. Last time patient was seen well around 7 pm last night  She woke up 2.5 hours prior to her presentation to the ED, tried to drink water and could not managed as the water was dripping out of her mouth and running down her chin. She also noted right sided facial droop and it was difficult for her to hold the glass as her hand was weak  Patient also has slurred speech, but I is able to provide history and describe her symptoms  ED Course: In the ED patient had stable Vs, blood work was unremarkable. Head CT don't reveal any acute intracranial pathology, but MRI of the brain showed small patchy areas of acute infarct in the left posterior frontal cortex and white matter, negative for hemorrhage EKG showed sinus rhythm, no acute ischemic changes observed UDS was positive for benzodiazepines  Review of Systems: As per HPI otherwise 10 point review of systems negative.   Ambulatory Status: Independent  Past Medical History:  Diagnosis Date  . Anxiety   . Bipolar 1 disorder (HCC)   . Hyperlipidemia   . Hypertension   . Obesity     Past Surgical History:  Procedure Laterality Date  . ANAL FISSURE REPAIR    . DENTAL SURGERY    . SHOULDER ARTHROSCOPY Right    x2    Social History   Social History  . Marital status: Single    Spouse name: N/A  . Number of children: N/A  . Years of education: N/A   Occupational History  . Not on file.   Social History Main Topics  . Smoking status: Current Every  Day Smoker    Packs/day: 0.25    Years: 35.00    Types: Cigarettes  . Smokeless tobacco: Never Used  . Alcohol use No  . Drug use: No  . Sexual activity: Not on file   Other Topics Concern  . Not on file   Social History Narrative   Lives alone.      Allergies  Allergen Reactions  . Ampicillin Hives and Nausea And Vomiting    Has patient had a PCN reaction causing immediate rash, facial/tongue/throat swelling, SOB or lightheadedness with hypotension: No Has patient had a PCN reaction causing severe rash involving mucus membranes or skin necrosis: No Has patient had a PCN reaction that required hospitalization No Has patient had a PCN reaction occurring within the last 10 years: No If all of the above answers are "NO", then may proceed with Cephalosporin use.   Marland Kitchen Cleocin [Clindamycin Hcl]     "deathly sick"  . Clonazepam Other (See Comments)    Pt does not remember what the reaction was  . Other Other (See Comments)    Allergy to mold, down and feathers per allergy test  . Penicillins Other (See Comments)    Has patient had a PCN reaction causing immediate rash, facial/tongue/throat swelling, SOB or lightheadedness with hypotension: No Has patient had a PCN reaction causing severe rash involving mucus membranes or skin necrosis: No Has  patient had a PCN reaction that required hospitalization No Has patient had a PCN reaction occurring within the last 10 years: No If all of the above answers are "NO", then may proceed with Cephalosporin use.   Pt told not to take because of reaction to ampicillin - no  . Bupropion Hcl Itching and Anxiety    Family History  Problem Relation Age of Onset  . Hypertension Mother   . Cancer Father     esophageal  . Heart attack Maternal Grandfather     Prior to Admission medications   Medication Sig Start Date End Date Taking? Authorizing Provider  albuterol (PROVENTIL HFA;VENTOLIN HFA) 108 (90 Base) MCG/ACT inhaler Inhale 2 puffs into  the lungs every 6 (six) hours as needed for wheezing or shortness of breath.    Historical Provider, MD  alprazolam Prudy Feeler(XANAX) 2 MG tablet Take 2 mg by mouth 3 (three) times daily. 1 mg in the morning, 2 mg in the afternoon, 1 mg at night     Historical Provider, MD  Asenapine Maleate (SAPHRIS) 10 MG SUBL Place 20 mg under the tongue at bedtime.    Historical Provider, MD  atenolol (TENORMIN) 50 MG tablet Take 100 mg by mouth at bedtime. 12/13/15   Historical Provider, MD  clomiPRAMINE (ANAFRANIL) 50 MG capsule Take 50 mg by mouth at bedtime. For OCD    Historical Provider, MD  cyclobenzaprine (FLEXERIL) 10 MG tablet Take 10 mg by mouth 2 (two) times daily.    Historical Provider, MD  dicyclomine (BENTYL) 10 MG capsule Take 10 mg by mouth 3 (three) times daily before meals.     Historical Provider, MD  Diethylpropion HCl (TENUATE PO) Take 75 mg by mouth daily. For weight loss    Historical Provider, MD  FLUoxetine (PROZAC) 40 MG capsule Take 80 mg by mouth daily.    Historical Provider, MD  furosemide (LASIX) 20 MG tablet Take 40 mg by mouth 2 (two) times daily.     Historical Provider, MD  hydrOXYzine (VISTARIL) 25 MG capsule Take 25 mg by mouth every 6 (six) hours as needed for anxiety or itching.     Historical Provider, MD  ibuprofen (ADVIL,MOTRIN) 200 MG tablet Take 600 mg by mouth every 6 (six) hours as needed (pain).    Historical Provider, MD  loperamide (IMODIUM) 2 MG capsule Take 1 capsule (2 mg total) by mouth 4 (four) times daily as needed for diarrhea or loose stools. 04/15/16   Lavera Guiseana Duo Liu, MD  Multiple Vitamins-Minerals (MULTIVITAMIN WITH MINERALS) tablet Take 1 tablet by mouth daily. Equate women's daily vitamins    Historical Provider, MD  MYRBETRIQ 25 MG TB24 tablet Take 25 mg by mouth daily. 03/27/16   Historical Provider, MD  naproxen (NAPROSYN) 500 MG tablet Take 500 mg by mouth 2 (two) times daily. 12/13/15   Historical Provider, MD  omeprazole (PRILOSEC) 20 MG capsule Take 40 mg by  mouth daily.     Historical Provider, MD  ondansetron (ZOFRAN) 4 MG tablet Take 1 tablet (4 mg total) by mouth every 6 (six) hours as needed for nausea or vomiting. 04/15/16   Lavera Guiseana Duo Liu, MD  oxyCODONE-acetaminophen (PERCOCET) 10-325 MG tablet Take 1 tablet by mouth 3 (three) times daily. scheduled 12/13/15   Historical Provider, MD  simvastatin (ZOCOR) 20 MG tablet Take 20 mg by mouth at bedtime.     Historical Provider, MD    Physical Exam: Vitals:   06/24/16 [id number] 06/24/16 0330 06/24/16 [id number] 06/24/16 [id number]  BP: 110/66 102/65  96/76  Pulse: 71 69  73  Resp: 15 15  18   Temp: 98.3 F (36.8 C)     TempSrc: Oral     SpO2: 96% 94%  98%  Weight:   (!) 137 kg (302 lb)   Height:   5\' 6"  (1.676 m)      General: Appears Nervous and uncomfortable due to back pain  Eyes: PERRLA, EOMI, normal lids, right eye is open wider than the left , ENT:  grossly normal hearing, lips & tongue, mucous membranes moist and intact, right-sided facial droop noted with right-sided deviation of the tip of the tongue Neck: no lymphoadenopathy, masses or thyromegaly Cardiovascular: RRR, no m/r/g. No JVD, carotid bruits. No LE edema.  Respiratory: bilateral expiratory wheezes anteriorly, no rales, coarse breath sound. Normal respiratory effort. No accessory muscle use observed Abdomen: soft, non-tender, non-distended, no organomegaly or masses appreciated. BS present in all quadrants Skin: no rash, ulcers or induration seen on limited exam Musculoskeletal: grossly normal tone BUE/BLE, good ROM, no bony abnormality or joint deformities observed Psychiatric: Appears anxious, speech slurred, but appropriate, alert and oriented x3 Neurologic: CN II-XII grossly intact, moves all extremities in coordinated fashion, sensation intact  Labs on Admission: I have personally reviewed following labs and imaging studies  CBC, BMP  GFR: Estimated Creatinine Clearance: 112.1 mL/min (by C-G formula based on SCr of 0.8  mg/dL).   Creatinine Clearance: Estimated Creatinine Clearance: 112.1 mL/min (by C-G formula based on SCr of 0.8 mg/dL).  Radiological Exams on Admission: Ct Head Wo Contrast  Result Date: 06/24/2016 CLINICAL DATA:  56 year old female with right facial droop. EXAM: CT HEAD WITHOUT CONTRAST TECHNIQUE: Contiguous axial images were obtained from the base of the skull through the vertex without intravenous contrast. COMPARISON:  None. FINDINGS: Brain: No evidence of acute infarction, hemorrhage, hydrocephalus, extra-axial collection or mass lesion/mass effect. Vascular: No hyperdense vessel or unexpected calcification. Skull: Normal. Negative for fracture or focal lesion. Sinuses/Orbits: No acute finding. Other: None IMPRESSION: No acute intracranial pathology. Electronically Signed   By: Elgie Collard M.D.   On: 06/24/2016 04:07   Mr Brain Wo Contrast  Result Date: 06/24/2016 CLINICAL DATA:  Right facial droop.  Right hand weakness.  Stroke. EXAM: MRI HEAD WITHOUT CONTRAST TECHNIQUE: Multiplanar, multiecho pulse sequences of the brain and surrounding structures were obtained without intravenous contrast. COMPARISON:  CT head 06/24/2016 FINDINGS: Brain: Acute infarct in the left posterior frontal cortex and white matter. Patchy areas of restricted diffusion are present in this area. No other areas of acute infarct. No significant chronic ischemia. Negative for hemorrhage or mass. Ventricle size normal. No shift of the midline structures. Vascular: Normal arterial flow void. Skull and upper cervical spine: Negative Sinuses/Orbits: Negative Other: None IMPRESSION: Small patchy areas of acute infarct in the left posterior frontal cortex and white matter. Negative for hemorrhage. Electronically Signed   By: Marlan Palau M.D.   On: 06/24/2016 08:10    EKG: Independently reviewed -  sinus rhythm, no acute ischemic changes observed  Assessment/Plan Principal Problem:   Acute ischemic stroke  [facility]) Active Problems:   Depression   Asthma   GERD   DISC DISEASE, LUMBAR   Bipolar disorder (HCC)   Hypertension   Hyperlipidemia    Acute ischemic stroke Neurology consult requested by EDP and will follow their recommendations Patient will be admitted on stroke protocol We'll check a lipid profile, hemoglobin A1c, TSH, echocardiogram, carotid arteries ultrasound Patient failed bedside swallow test, will  be seen by speech Initiated aspirin therapy when is able to swallow  Hypertension Currently blood pressures is borderline hypotensive. Hold home medications - Atenolol and LAsix  Anxiety Xanax is on hold d/t patient having failed bedside swallow test  Bipolar disorder/Depression/Borderline personality disorder  Continue Saphris, Anafranil, Prozac are on hold d/t patient having failed swallow test  GERD - PPI is on hold  Hyperlipidemia Zocor is on hold  and refresh lipid panel Goal LDL< 70  DDD - on opiates, currently on hold as she failed swallow test Will add low dose Dilaudid for severe pain   DVT prophylaxis: heparin  Code Status: full Family Communication: at bedside Disposition Plan: telemetry Consults called: neurology by EDP Admission status: observation  Raymon Mutton, PA-C Pager: [phone] [facility]  If 7PM-7AM, please contact night-coverage [email or web address] Password [facility]  06/24/2016, 10:32 AM

## 2016-06-25 ENCOUNTER — Observation Stay (HOSPITAL_COMMUNITY): Payer: Medicare Other

## 2016-06-25 ENCOUNTER — Encounter (HOSPITAL_COMMUNITY): Payer: Self-pay | Admitting: Radiology

## 2016-06-25 ENCOUNTER — Observation Stay (HOSPITAL_BASED_OUTPATIENT_CLINIC_OR_DEPARTMENT_OTHER): Payer: Medicare Other

## 2016-06-25 DIAGNOSIS — I10 Essential (primary) hypertension: Secondary | ICD-10-CM | POA: Diagnosis not present

## 2016-06-25 DIAGNOSIS — Z880 Allergy status to penicillin: Secondary | ICD-10-CM | POA: Diagnosis not present

## 2016-06-25 DIAGNOSIS — E785 Hyperlipidemia, unspecified: Secondary | ICD-10-CM | POA: Diagnosis present

## 2016-06-25 DIAGNOSIS — R062 Wheezing: Secondary | ICD-10-CM | POA: Diagnosis present

## 2016-06-25 DIAGNOSIS — I6789 Other cerebrovascular disease: Secondary | ICD-10-CM

## 2016-06-25 DIAGNOSIS — Z9889 Other specified postprocedural states: Secondary | ICD-10-CM | POA: Diagnosis not present

## 2016-06-25 DIAGNOSIS — F603 Borderline personality disorder: Secondary | ICD-10-CM | POA: Diagnosis present

## 2016-06-25 DIAGNOSIS — J45909 Unspecified asthma, uncomplicated: Secondary | ICD-10-CM | POA: Diagnosis present

## 2016-06-25 DIAGNOSIS — Z6841 Body Mass Index (BMI) 40.0 and over, adult: Secondary | ICD-10-CM | POA: Diagnosis not present

## 2016-06-25 DIAGNOSIS — I638 Other cerebral infarction: Secondary | ICD-10-CM | POA: Diagnosis not present

## 2016-06-25 DIAGNOSIS — K219 Gastro-esophageal reflux disease without esophagitis: Secondary | ICD-10-CM | POA: Diagnosis present

## 2016-06-25 DIAGNOSIS — Z79899 Other long term (current) drug therapy: Secondary | ICD-10-CM | POA: Diagnosis not present

## 2016-06-25 DIAGNOSIS — E784 Other hyperlipidemia: Secondary | ICD-10-CM | POA: Diagnosis not present

## 2016-06-25 DIAGNOSIS — F319 Bipolar disorder, unspecified: Secondary | ICD-10-CM | POA: Diagnosis present

## 2016-06-25 DIAGNOSIS — I6340 Cerebral infarction due to embolism of unspecified cerebral artery: Secondary | ICD-10-CM | POA: Diagnosis present

## 2016-06-25 DIAGNOSIS — F17210 Nicotine dependence, cigarettes, uncomplicated: Secondary | ICD-10-CM | POA: Diagnosis present

## 2016-06-25 DIAGNOSIS — G8929 Other chronic pain: Secondary | ICD-10-CM | POA: Diagnosis present

## 2016-06-25 DIAGNOSIS — F329 Major depressive disorder, single episode, unspecified: Secondary | ICD-10-CM | POA: Diagnosis present

## 2016-06-25 DIAGNOSIS — Z888 Allergy status to other drugs, medicaments and biological substances status: Secondary | ICD-10-CM | POA: Diagnosis not present

## 2016-06-25 DIAGNOSIS — I639 Cerebral infarction, unspecified: Secondary | ICD-10-CM | POA: Diagnosis not present

## 2016-06-25 LAB — CBC
HCT: 40.2 % (ref 36.0–46.0)
HEMOGLOBIN: 12.6 g/dL (ref 12.0–15.0)
MCH: 27.6 pg (ref 26.0–34.0)
MCHC: 31.3 g/dL (ref 30.0–36.0)
MCV: 88 fL (ref 78.0–100.0)
Platelets: 268 10*3/uL (ref 150–400)
RBC: 4.57 MIL/uL (ref 3.87–5.11)
RDW: 14 % (ref 11.5–15.5)
WBC: 10.5 10*3/uL (ref 4.0–10.5)

## 2016-06-25 LAB — BASIC METABOLIC PANEL
ANION GAP: 9 (ref 5–15)
BUN: 11 mg/dL (ref 6–20)
CALCIUM: 9.4 mg/dL (ref 8.9–10.3)
CO2: 24 mmol/L (ref 22–32)
CREATININE: 0.73 mg/dL (ref 0.44–1.00)
Chloride: 106 mmol/L (ref 101–111)
GFR calc Af Amer: >60 mL/min (ref >60)
GFR calc non Af Amer: >60 mL/min (ref >60)
GLUCOSE: 101 mg/dL — AB (ref 65–99)
Potassium: 4.2 mmol/L (ref 3.5–5.1)
Sodium: 139 mmol/L (ref 135–145)

## 2016-06-25 LAB — HIV ANTIBODY (ROUTINE TESTING W REFLEX): HIV SCREEN 4TH GENERATION: NONREACTIVE

## 2016-06-25 LAB — ECHOCARDIOGRAM COMPLETE
E/e' ratio: 6.43
FS: 27 % — AB (ref 28–44)
HEIGHTINCHES: 66 in
IVS/LV PW RATIO, ED: 1
LA ID, A-P, ES: 36 mm
LA diam end sys: 36 mm
LA vol A4C: 34.9 ml
LA vol index: 18.5 mL/m2
LA vol: 44.1 mL
LADIAMINDEX: 1.51 cm/m2
LDCA: 3.14 cm2
LV E/e' medial: 6.43
LV E/e'average: 6.43
LV TDI E'LATERAL: 12
LVELAT: 12 cm/s
LVOT SV: 65 mL
LVOT VTI: 20.8 cm
LVOT diameter: 20 mm
LVOT peak grad rest: 6 mmHg
LVOT peak vel: 118 cm/s
MV pk A vel: 103 m/s
MV pk E vel: 77.1 m/s
MVPG: 2 mmHg
PW: 10 mm — AB (ref 0.6–1.1)
RV LATERAL S' VELOCITY: 19.5 cm/s
RV TAPSE: 27.5 mm
TDI e' medial: 9.46
Weight: 4832 oz

## 2016-06-25 LAB — LIPID PANEL
CHOL/HDL RATIO: 4.7 ratio
Cholesterol: 177 mg/dL (ref 0–200)
HDL: 38 mg/dL — ABNORMAL LOW (ref >40)
LDL CALC: 98 mg/dL (ref 0–99)
TRIGLYCERIDES: 207 mg/dL — AB (ref <150)
VLDL: 41 mg/dL — AB (ref 0–40)

## 2016-06-25 MED ORDER — IOPAMIDOL (ISOVUE-370) INJECTION 76%
INTRAVENOUS | Status: AC
  Filled 2016-06-25: qty 100

## 2016-06-25 MED ORDER — DICYCLOMINE HCL 10 MG PO CAPS
10.0000 mg | ORAL_CAPSULE | Freq: Three times a day (TID) | ORAL | Status: DC
  Administered 2016-06-25 – 2016-06-27 (×5): 10 mg via ORAL
  Filled 2016-06-25 (×8): qty 1

## 2016-06-25 MED ORDER — PREMIER PROTEIN SHAKE
11.0000 [oz_av] | ORAL | Status: DC
  Administered 2016-06-25: 11 [oz_av] via ORAL
  Filled 2016-06-25 (×4): qty 325.31

## 2016-06-25 MED ORDER — ASPIRIN EC 325 MG PO TBEC
325.0000 mg | DELAYED_RELEASE_TABLET | Freq: Every day | ORAL | Status: DC
  Administered 2016-06-25 – 2016-06-27 (×3): 325 mg via ORAL
  Filled 2016-06-25 (×3): qty 1

## 2016-06-25 MED ORDER — IOPAMIDOL (ISOVUE-370) INJECTION 76%
INTRAVENOUS | Status: AC
  Administered 2016-06-25: 150 mL
  Filled 2016-06-25: qty 100

## 2016-06-25 MED ORDER — BENZOCAINE 10 % MT GEL
Freq: Four times a day (QID) | OROMUCOSAL | Status: DC | PRN
  Filled 2016-06-25: qty 9.4

## 2016-06-25 MED ORDER — ACETAMINOPHEN 325 MG PO TABS
650.0000 mg | ORAL_TABLET | Freq: Four times a day (QID) | ORAL | Status: DC | PRN
  Administered 2016-06-25 – 2016-06-26 (×2): 650 mg via ORAL
  Filled 2016-06-25 (×2): qty 2

## 2016-06-25 MED ORDER — SODIUM CHLORIDE 0.9 % IV SOLN
INTRAVENOUS | Status: DC
  Administered 2016-06-26: 01:00:00 via INTRAVENOUS

## 2016-06-25 NOTE — Progress Notes (Signed)
Modified Barium Swallow Progress Note  Patient Details  Name: Tammy Hines MRN: [id number] Date of Birth: 06/02/1960  Today's Date: 06/25/2016  Modified Barium Swallow completed.  Full report located under Chart Review in the Imaging Section.  Brief recommendations include the following:  Clinical Impression  Pt presents with functional oropharyngeal swallow with minimal motor component - there is functional mastication of all materials; mild deficits in base-of-tongue action, leading to mild vallecular residue.  When consuming a 13 mm pill in conjunction with thin liquid, trace amounts of thin barium reached the level of the vocal cords.  There was no aspiration throughout study, nor other incidents of penetration.  Esophageal screen revealed clearance of barium through LES.  Recommend advancing diet to regular solids, thin liquids; meds whole in puree.   Pt viewed video in real time and we discussed results.  No F/u warranted.    Swallow Evaluation Recommendations       SLP Diet Recommendations: Regular solids;Thin liquid   Liquid Administration via: Cup;Straw   Medication Administration: Whole meds with puree   Supervision: Patient able to self feed           Oral Care Recommendations: Oral care BID        Blenda MountsCouture, Harveen Flesch Laurice 06/25/2016,12:29 PM

## 2016-06-25 NOTE — Progress Notes (Signed)
    [facility] has been requested to perform a transesophageal echocardiogram on 06/26/16 for Stroke.  After careful review of history and examination, the risks and benefits of transesophageal echocardiogram have been explained including risks of esophageal damage, perforation (1:10,000 risk), bleeding, pharyngeal hematoma as well as other potential complications associated with conscious sedation including aspiration, arrhythmia, respiratory failure and death. Alternatives to treatment were discussed, questions were answered. Patient is willing to proceed. Labs and vital signs are stable pre procedure.   Laverda PageLindsay Shelonda Saxe, NP-C 06/25/2016 2:42 PM

## 2016-06-25 NOTE — Progress Notes (Signed)
PROGRESS NOTE   Tammy Hines  JXB:[id number]    DOB: [birth year]    DOA: 06/24/2016  PCP: Quitman Livings, MD   I have briefly reviewed patients previous medical records in [facility].  Brief Narrative:  56 year old female with PMH of severe anxiety, depression, borderline personality/bipolar disorder, HTN, HLD, GERD, asthma, morbid obesity, presented to ED with sudden onset of right-sided facial droop, right hand weakness and slurred speech. In the ED, CT head did not reveal any acute pathology but MRI brain showed acute stroke. Neurology consulted.   Assessment & Plan:   Principal Problem:   Acute ischemic stroke [facility]) Active Problems:   Depression   Asthma   GERD   DISC DISEASE, LUMBAR   Bipolar disorder (HCC)   Hypertension   Hyperlipidemia   Anxiety   Acute stroke: Left posterior frontal cortical and subcortical infarct embolic secondary to unknown source. CT head without acute abnormality MRI head: Left posterior frontal cortical and white matter infarct. CTA head: Left frontal lobe infarct. No significant abnormalities. Carotid Doppler: No significant ICA stenosis. Heterogeneous areas noted in bilateral thyroid, consider thyroid ultrasound if clinically necessary-can be pursued as outpatient. 2-D echo: LVEF 60-65 percent. Grade 1 diastolic dysfunction. TEE: To look for embolic source. Neurology has arranged with cardiology for 3/22. LDL 98 A1c: Pending Diet: Advance to regular diet by speech therapy. Not on antithrombotic spread to admission. Aspirin 325 MG daily initiated for secondary stroke prevention. Therapies recommend home health PT, OT and 3 and 1.  Essential hypertension Permissive hypertension but gradually normalizing 5-7 days.  Hyperlipidemia LDL 98, goal <70. Continue Zocor 20 MG and consider increasing dose.  Tobacco abuse Cessation counseled.  Morbid obesity/Body mass index is 48.74 kg/m.  Anxiety/bipolar  disorder Stable.  GERD  Chronic pain As per patient's report to RN, she takes scheduled Percocet-continue per home regimen.   DVT prophylaxis: Heparin  Code Status: Full  Family Communication: None at bedside  Disposition: DC home pending stroke workup, possibly 3/22   Consultants:  Neurology   Procedures:  Carotid Doppler: Summary:  - No significant extracranial carotid artery stenosis demonstrated.   Vertebrals are patent with antegrade flow. - Bilateral thyroid: heterogenous areas noted.Correlate with   thyroid ultrasound if clinically necessary.  2-D echo 06/25/16: Study Conclusions  - Left ventricle: The cavity size was normal. Wall thickness was   normal. Systolic function was normal. The estimated ejection   fraction was in the range of 60% to 65%. Wall motion was normal;   there were no regional wall motion abnormalities. Doppler   parameters are consistent with abnormal left ventricular   relaxation (grade 1 diastolic dysfunction). - Aortic valve: There was no stenosis. - Mitral valve: There was no significant regurgitation. - Right ventricle: The cavity size was normal. Systolic function   was normal. - Pulmonary arteries: No complete TR doppler jet so unable to   estimate PA systolic pressure. - Inferior vena cava: The vessel was normal in size. The   respirophasic diameter changes were in the normal range (>= 50%),   consistent with normal central venous pressure.  Impressions:  - Normal LV size and systolic function, EF 60-65%. Normal RV size   and systolic function. No significant valvular abnormalities.  Antimicrobials:  None    Subjective: Seen this morning. Stated that she felt better. Improved slurred speech but not back to baseline.   ROS: Reported some dyspnea yesterday but that has resolved today. No chest pain reported.   Objective:  Vitals:   06/25/16 0100 06/25/16 0300 06/25/16 0500 06/25/16 1338  BP: (!) 147/96 (!) 142/87  119/68 (!) 149/79  Pulse: 79 77 68 95  Resp: 16 15 15 20   Temp: 98.6 F (37 C) 98.2 F (36.8 C) 98.3 F (36.8 C) 98.5 F (36.9 C)  TempSrc: Oral Oral Oral Oral  SpO2: 93% 94% 94% 98%  Weight:      Height:        Examination:  General exam: Pleasant middle-aged female, morbidly obese, sitting up comfortably in bed. RN at bedside.  Respiratory system: Clear to auscultation. Respiratory effort normal. Cardiovascular system: S1 & S2 heard, RRR. No JVD, murmurs, rubs, gallops or clicks. No pedal edema.Telemetry: Sinus rhythm.  Gastrointestinal system: Abdomen is nondistended, soft and nontender. No organomegaly or masses felt. Normal bowel sounds heard. Central nervous system: Alert and oriented. Dysarthria. Diminished right nasolabial fold. Extremities: Symmetric 5 x 5 power. Skin: No rashes, lesions or ulcers Psychiatry: Judgement and insight appear normal. Mood & affect appropriate.     Data Reviewed: I have personally reviewed following labs and imaging studies  CBC:  Recent Labs Lab 06/24/16 0406 06/24/16 0421 06/25/16 0358  WBC 8.8  --  10.5  NEUTROABS 5.3  --   --   HGB 12.4 12.6 12.6  HCT 39.2 37.0 40.2  MCV 87.3  --  88.0  PLT 228  --  268   Basic Metabolic Panel:  Recent Labs Lab 06/24/16 0406 06/24/16 0421 06/24/16 1150 06/25/16 0358  NA 136 139  --  139  K 3.8 3.9  --  4.2  CL 100* 102  --  106  CO2 27  --   --  24  GLUCOSE 109* 114*  --  101*  BUN 18 19  --  11  CREATININE 0.86 0.80 0.86 0.73  CALCIUM 9.0  --   --  9.4   Liver Function Tests:  Recent Labs Lab 06/24/16 0406  AST 23  ALT 25  ALKPHOS 73  BILITOT 0.5  PROT 6.2*  ALBUMIN 3.5   Coagulation Profile:  Recent Labs Lab 06/24/16 0406  INR 1.00   Cardiac Enzymes: No results for input(s): CKTOTAL, CKMB, CKMBINDEX, TROPONINI in the last 168 hours. HbA1C: No results for input(s): HGBA1C in the last 72 hours. CBG:  Recent Labs Lab 06/24/16 0308  GLUCAP 131*    No  results found for this or any previous visit (from the past 240 hour(s)).       Radiology Studies: Ct Angio Head W Or Wo Contrast  Result Date: 06/25/2016 CLINICAL DATA:  Posterior left frontal lobe infarct. Right facial droop and right hand weakness. EXAM: CT ANGIOGRAPHY HEAD TECHNIQUE: Multidetector CT imaging of the head was performed using the standard protocol during bolus administration of intravenous contrast. Multiplanar CT image reconstructions and MIPs were obtained to evaluate the vascular anatomy. CONTRAST:  150 mL Isovue 370. The initial bolus was suboptimal. Therefore two 75 mL doses were used. COMPARISON:  MRI brain 06/24/2016 FINDINGS: CT HEAD Brain: A left frontal lobe infarct is stable. No acute hemorrhage or mass lesion is present. The insular ribbon is intact. The basal ganglia are intact. There is no significant extension of the infarct. No significant mass effect is present. Ventricles are of normal size. The brainstem and cerebellum are unremarkable. Vascular: No hyperdense vessel or unexpected calcification. Skull: The calvarium is normal. No focal lytic or blastic lesions are present. Sinuses: The paranasal sinuses and mastoid air cells are clear. Orbits:  The globes and orbits are unremarkable. CTA HEAD Anterior circulation: The internal carotid arteries are within normal limits from the high cervical segments through the ICA termini bilaterally. The A1 and M1 segments are normal. The anterior communicating artery is patent. MCA bifurcations are within normal limits. The ACA and MCA branch vessels are normal. No significant proximal stenosis or occlusion is present to explain the left MCA territory infarct. Posterior circulation: The vertebral arteries are codominant. The PICA origins are visualized and normal bilaterally. Basilar artery is normal. The right posterior cerebral artery is of fetal type. A right posterior communicating artery is present. The left posterior cerebral  artery is of fetal type. The PCA branch vessels are within normal limits. Venous sinuses: The dural sinuses are patent. The left transverse sinus is dominant. The straight sinus and deep cerebral veins are intact. Anatomic variants: Fetal type left posterior cerebral artery. Delayed phase: The postcontrast images demonstrate no pathologic enhancement. The infarct is better visualized on the postcontrast images. IMPRESSION: 1. Left frontal lobe infarct. 2. Normal variant CTA circle of Willis. No significant proximal stenosis, aneurysm, or branch vessel occlusion. Electronically Signed   By: Marin Roberts M.D.   On: 06/25/2016 13:29   Ct Head Wo Contrast  Result Date: 06/24/2016 CLINICAL DATA:  56 year old female with right facial droop. EXAM: CT HEAD WITHOUT CONTRAST TECHNIQUE: Contiguous axial images were obtained from the base of the skull through the vertex without intravenous contrast. COMPARISON:  None. FINDINGS: Brain: No evidence of acute infarction, hemorrhage, hydrocephalus, extra-axial collection or mass lesion/mass effect. Vascular: No hyperdense vessel or unexpected calcification. Skull: Normal. Negative for fracture or focal lesion. Sinuses/Orbits: No acute finding. Other: None IMPRESSION: No acute intracranial pathology. Electronically Signed   By: Elgie Collard M.D.   On: 06/24/2016 04:07   Mr Brain Wo Contrast  Result Date: 06/24/2016 CLINICAL DATA:  Right facial droop.  Right hand weakness.  Stroke. EXAM: MRI HEAD WITHOUT CONTRAST TECHNIQUE: Multiplanar, multiecho pulse sequences of the brain and surrounding structures were obtained without intravenous contrast. COMPARISON:  CT head 06/24/2016 FINDINGS: Brain: Acute infarct in the left posterior frontal cortex and white matter. Patchy areas of restricted diffusion are present in this area. No other areas of acute infarct. No significant chronic ischemia. Negative for hemorrhage or mass. Ventricle size normal. No shift of the midline  structures. Vascular: Normal arterial flow void. Skull and upper cervical spine: Negative Sinuses/Orbits: Negative Other: None IMPRESSION: Small patchy areas of acute infarct in the left posterior frontal cortex and white matter. Negative for hemorrhage. Electronically Signed   By: Marlan Palau M.D.   On: 06/24/2016 08:10   Dg Chest Port 1 View  Result Date: 06/24/2016 CLINICAL DATA:  Wheezing starting this morning EXAM: PORTABLE CHEST 1 VIEW COMPARISON:  08/30/2015 FINDINGS: Cardiomediastinal silhouette is stable. No infiltrate or pleural effusion. No pulmonary edema. Bony thorax is unremarkable. IMPRESSION: No active disease. Electronically Signed   By: Natasha Mead M.D.   On: 06/24/2016 12:01   Dg Swallowing Func-speech Pathology  Result Date: 06/25/2016 Objective Swallowing Evaluation: Type of Study: MBS-Modified Barium Swallow Study Patient Details Name: LAKIAH DHINGRA MRN: [id number] Date of Birth: [birth year] Today's Date: 06/25/2016 Time: SLP Start Time (ACUTE ONLY): 1200-SLP Stop Time (ACUTE ONLY): 1215 SLP Time Calculation (min) (ACUTE ONLY): 15 min Past Medical History: Past Medical History: Diagnosis Date . Anxiety  . Arthritis   "right knee, left hip, going down my neck into my right shoulder" (06/24/2016) . Asthma  . Bipolar  1 disorder (HCC)  . Chronic lower back pain  . Chronic neck pain  . COPD (chronic obstructive pulmonary disease) (HCC)  . Depression   [name]/notes 06/24/2016 . Family history of adverse reaction to anesthesia   "makes my mom throw up"  . Fibromyalgia  . GERD (gastroesophageal reflux disease)   [name]/notes 06/24/2016 . Hepatitis B  . History of hiatal hernia  . History of stomach ulcers  . History of transient ischemic attack (TIA)   /RN 06/24/2016 . Hyperlipidemia  . Hypertension  . Ischemic stroke (HCC)   acute/notes 06/24/2016; "left sided weakness"/RN (06/24/2016 . Migraine   "a few/year now" (06/24/2016) . Obesity  . PONV (postoperative nausea and vomiting)  Past Surgical History: Past  Surgical History: Procedure Laterality Date . ANAL FISSURE REPAIR   . FRACTURE SURGERY   . MULTIPLE TOOTH EXTRACTIONS  2018  "I had 6 teeth pulled" . SHOULDER ARTHROSCOPY W/ ROTATOR CUFF REPAIR Right 2000 . SHOULDER SURGERY Right [id number] HPI: 56 year old female came to ED with dysarthria, right facial asymmetry.  MRI Small patchy areas of acute infarct in the left posterior frontal lobe. Subjective: alert, communicative Assessment / Plan / Recommendation CHL IP CLINICAL IMPRESSIONS 06/25/2016 Clinical Impression Pt presents with functional oropharyngeal swallow with minimal motor component - there is functional mastication of all materials; mild deficits in base-of-tongue action, leading to mild vallecular residue.  When consuming a 13 mm pill in conjunction with thin liquid, trace amounts of thin barium reached the level of the vocal cords.  There was no aspiration throughout study, nor other incidents of penetration.  Esophageal screen revealed clearance of barium through LES.  Recommend advancing diet to regular solids, thin liquids; meds whole in puree.   Pt viewed video in real time and we discussed results.  No F/u warranted.  SLP Visit Diagnosis Dysphagia, unspecified (R13.10) Attention and concentration deficit following -- Frontal lobe and executive function deficit following -- Impact on safety and function --   CHL IP TREATMENT RECOMMENDATION 06/25/2016 Treatment Recommendations No treatment recommended at this time   No flowsheet data found. CHL IP DIET RECOMMENDATION 06/25/2016 SLP Diet Recommendations Regular solids;Thin liquid Liquid Administration via Cup;Straw Medication Administration Whole meds with puree Compensations -- Postural Changes --   CHL IP OTHER RECOMMENDATIONS 06/25/2016 Recommended Consults -- Oral Care Recommendations Oral care BID Other Recommendations --   CHL IP FOLLOW UP RECOMMENDATIONS 06/25/2016 Follow up Recommendations None   CHL IP FREQUENCY AND DURATION 06/24/2016 Speech Therapy  Frequency (ACUTE ONLY) min 2x/week Treatment Duration 1 week      CHL IP ORAL PHASE 06/25/2016 Oral Phase WFL Oral - Pudding Teaspoon -- Oral - Pudding Cup -- Oral - Honey Teaspoon -- Oral - Honey Cup -- Oral - Nectar Teaspoon -- Oral - Nectar Cup -- Oral - Nectar Straw -- Oral - Thin Teaspoon -- Oral - Thin Cup -- Oral - Thin Straw -- Oral - Puree -- Oral - Mech Soft -- Oral - Regular -- Oral - Multi-Consistency -- Oral - Pill -- Oral Phase - Comment --  CHL IP PHARYNGEAL PHASE 06/25/2016 Pharyngeal Phase Impaired Pharyngeal- Pudding Teaspoon -- Pharyngeal -- Pharyngeal- Pudding Cup -- Pharyngeal -- Pharyngeal- Honey Teaspoon -- Pharyngeal -- Pharyngeal- Honey Cup -- Pharyngeal -- Pharyngeal- Nectar Teaspoon -- Pharyngeal -- Pharyngeal- Nectar Cup -- Pharyngeal -- Pharyngeal- Nectar Straw -- Pharyngeal -- Pharyngeal- Thin Teaspoon -- Pharyngeal -- Pharyngeal- Thin Cup Reduced airway/laryngeal closure;Penetration/Aspiration during swallow Pharyngeal Material enters airway, CONTACTS cords and then ejected out  Pharyngeal- Thin Straw -- Pharyngeal -- Pharyngeal- Puree Delayed swallow initiation-vallecula;Reduced tongue base retraction;Pharyngeal residue - valleculae Pharyngeal -- Pharyngeal- Mechanical Soft Pharyngeal residue - valleculae;Reduced tongue base retraction;Delayed swallow initiation-vallecula Pharyngeal -- Pharyngeal- Regular -- Pharyngeal -- Pharyngeal- Multi-consistency -- Pharyngeal -- Pharyngeal- Pill -- Pharyngeal -- Pharyngeal Comment --  CHL IP CERVICAL ESOPHAGEAL PHASE 06/25/2016 Cervical Esophageal Phase WFL Pudding Teaspoon -- Pudding Cup -- Honey Teaspoon -- Honey Cup -- Nectar Teaspoon -- Nectar Cup -- Nectar Straw -- Thin Teaspoon -- Thin Cup -- Thin Straw -- Puree -- Mechanical Soft -- Regular -- Multi-consistency -- Pill -- Cervical Esophageal Comment -- CHL IP GO 06/25/2016 Functional Assessment Tool Used clinical judgment Functional Limitations Swallowing Swallow Current Status (D6644) CH  Swallow Goal Status (I3474) [facility] Swallow Discharge Status (Q5956) CH Motor Speech Current Status (L8756) (None) Motor Speech Goal Status (E3329) (None) Motor Speech Goal Status (J1884) (None) Spoken Language Comprehension Current Status (Z6606) (None) Spoken Language Comprehension Goal Status (T0160) (None) Spoken Language Comprehension Discharge Status (F0932) (None) Spoken Language Expression Current Status (T5573) (None) Spoken Language Expression Goal Status (U2025) (None) Spoken Language Expression Discharge Status [phone]) (None) Attention Current Status (C3762) (None) Attention Goal Status (G3151) (None) Attention Discharge Status (V6160) (None) Memory Current Status (V3710) (None) Memory Goal Status (G2694) (None) Memory Discharge Status (W5462) (None) Voice Current Status (V0350) (None) Voice Goal Status (K9381) (None) Voice Discharge Status (W2993) (None) Other Speech-Language Pathology Functional Limitation Current Status (Z1696) (None) Other Speech-Language Pathology Functional Limitation Goal Status (V8938) (None) Other Speech-Language Pathology Functional Limitation Discharge Status [phone]) (None) Blenda Mounts Laurice 06/25/2016, 12:29 PM                   Scheduled Meds: . alprazolam  2 mg Oral TID  . asenapine  20 mg Sublingual QHS  . aspirin EC  325 mg Oral Daily  . clomiPRAMINE  50 mg Oral QHS  . dicyclomine  10 mg Oral TID AC  . fesoterodine  8 mg Oral QPM  . FLUoxetine  40 mg Oral TID  . gabapentin  400 mg Oral QID  . heparin  5,000 Units Subcutaneous Q8H  . iopamidol      . pantoprazole  40 mg Oral Daily  . protein supplement shake  11 oz Oral Q24H  . simvastatin  20 mg Oral QHS   Continuous Infusions:   LOS: 0 days     HONGALGI,ANAND, MD, FACP, FHM. [facility] Pager [phone]  If 7PM-7AM, please contact night-coverage [email or web address] Password [facility] 06/25/2016, 6:03 PM

## 2016-06-25 NOTE — Progress Notes (Signed)
  Echocardiogram 2D Echocardiogram has been performed.  Tammy Hines, Tammy Hines 06/25/2016, 10:51 AM

## 2016-06-25 NOTE — Progress Notes (Signed)
Nutrition Brief Note  Malnutrition Screening Tool result is inaccurate. Pt denies any unintentional weight loss or poor appetite. She states that she has an eating disorder; she eats constantly and large amounts to the point that she feels sick. Recently she has been drinking 5 to 6 Slim Fast shakes daily and she states that this is working for her; she reports losing 28 lbs.   Wt Readings from Last 15 Encounters:  06/24/16 (!) 302 lb (137 kg)  12/31/15 (!) 302 lb (137 kg)  10/02/15 295 lb (133.8 kg)  08/30/15 291 lb (132 kg)  11/19/14 287 lb 3 oz (130.3 kg)  12/04/08 (!) 230 lb (104.3 kg)  03/06/08 (!) 230 lb (104.3 kg)  10/22/07 (!) 244 lb (110.7 kg)  09/16/07 (!) 245 lb (111.1 kg)  09/06/07 (!) 240 lb (108.9 kg)    Body mass index is 48.74 kg/m. Patient meets criteria for Morbid Obesity based on current BMI.   Current diet order is Heart Healthy; pt reports good appetite. She was on Full Liquid diet earlier today and eating 100% of meals. Pt had questions regarding heart healthy diet. RD first emphasized that if she has binge eating disorder, she should prioritize meeting with an outpatient dietitian for regular nutrition counseling in order to establish a healthy relationship with food.  Pt asked questions about salt and caffeine. She reports shaking the salt shaker about "25 times" on food when she eats out and drinking multiple Monster drinks daily. She also drinks Slim Fast shakes with caffeine (1 shake = 1 cup coffee). RD discussed sodium guidelines for heart healthy eating. Discussed ways to decrease sodium in the diet. Recommended that patient use caffeine-free Slim Fast or Premier Protein shakes. Encouraged intake of fruits, vegetables, and plant-based foods. Recommended taking a daily fish oil/omega-3 supplement. Provided information for Nutrition and Diabetes and Education Services and encouraged patient to meet with an outpatient dietitian.   Labs and medications reviewed. Pt  drinking Ensure at time of visit; will replace this with Premier Protein per pt request to continues shakes.   No additional nutrition interventions warranted at this time. If  new nutrition issues arise, please consult RD.   Dorothea Ogleeanne Zadie Deemer RD, LDN, CSP Inpatient Clinical Dietitian Pager: [phone] After Hours Pager: [phone]

## 2016-06-25 NOTE — Progress Notes (Signed)
STROKE TEAM PROGRESS NOTE   SUBJECTIVE (INTERVAL HISTORY) Her IV team RN is at the bedside attempting to place IV for CTA. Pt with no new complaints.   OBJECTIVE Temp:  [98.2 F (36.8 C)-98.6 F (37 C)] 98.3 F (36.8 C) (03/21 0500) Pulse Rate:  [68-87] 68 (03/21 0500) Cardiac Rhythm: Normal sinus rhythm (03/21 0700) Resp:  [15-21] 15 (03/21 0500) BP: (104-147)/(66-96) 119/68 (03/21 0500) SpO2:  [93 %-98 %] 94 % (03/21 0500)  CBC:  Recent Labs Lab 06/24/16 0406 06/24/16 0421 06/25/16 0358  WBC 8.8  --  10.5  NEUTROABS 5.3  --   --   HGB 12.4 12.6 12.6  HCT 39.2 37.0 40.2  MCV 87.3  --  88.0  PLT 228  --  268    Basic Metabolic Panel:  Recent Labs Lab 06/24/16 0406 06/24/16 0421 06/24/16 1150 06/25/16 0358  NA 136 139  --  139  K 3.8 3.9  --  4.2  CL 100* 102  --  106  CO2 27  --   --  24  GLUCOSE 109* 114*  --  101*  BUN 18 19  --  11  CREATININE 0.86 0.80 0.86 0.73  CALCIUM 9.0  --   --  9.4   HgbA1c:  Lab Results  Component Value Date   HGBA1C 5.6 10/22/2007   PHYSICAL EXAM Pleasant middle aged lady not in distress. . Afebrile. Head is nontraumatic. Neck is supple without bruit.    Cardiac exam no murmur or gallop. Lungs are clear to auscultation. Distal pulses are well felt. Neurological Exam :  Awake alert oriented x 3 normal   Language but mild dysarthria. Mild right lower face asymmetry. Tongue midline. No drift. Mild diminished fine finger movements on right. Orbits left over right upper extremity. Mild right grip weak.. Normal sensation . Normal coordination.   ASSESSMENT/PLAN [name] is a 56 y.o. female with history of severe anxiety, depression, borderline personality / bipolar disorder 1 , HTN, HLD, GERD, asthma  presenting with R arm weakness, R facial droop, slurred speech. She did not receive IV t-PA due to being out of the window, completed stroke on MRI.   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 B thyroid heterogenous areas, consider US  2D Echo  pending   TEE to look for embolic source. Arranged with [facility] for tomorrow.  If positive for PFO (patent foramen ovale), check bilateral lower extremity venous dopplers to rule out DVT as possible source of stroke. (I have made patient NPO after midnight tonight).   If TEE negative, [facility] will consult and consider placement of an implantable loop recorder to evaluate for atrial fibrillation as etiology of stroke. This has been explained to patient/family by Dr. Pearlean BrownieSethi and they are agreeable.   LDL 98  HgbA1c pending  Heparin 5000 units sq tid for VTE prophylaxis  Diet full liquid Room service appropriate? Yes; Fluid consistency: Thin No antithrombotic prior to admission, now on No antithrombotic. Added aspirin 325 mg daily for secondary stroke prevention  Therapy recommendations:  pending   Disposition:  pending   Hypertension  Stable Permissive hypertension (OK if < 220/120) but gradually normalize in 5-7  days Long-term BP goal normotensive  Hyperlipidemia  Home meds:  zocor 20, resumed in hospital  LDL 98 goal  Consider increase statin dose  Continue statin at discharge  Other Stroke Risk Factors  Cigarette smoker, advised to stop smoking  UDS positive for benzos  Morbid Obesity, Body mass index is 48.74 kg/m.  Other Active Problems  Anxiety  Bipolar disorder  GERD  Hospital day # 0  Rhoderick Moody [facility] See Amion for Pager information 06/25/2016 11:21 AM  I have personally examined this patient, reviewed notes, independently viewed imaging studies, participated in medical decision making and plan of  care.ROS completed by me personally and pertinent positives fully documented  I have made any additions or clarifications directly to the above note. Agree with note above. She presented with embolic left brain infarct. Start aspirin for stroke prevention and  ongoing stroke workup. Greater than 50% time during this 25 minute visit was spent on counseling and coordination of care about her stroke evaluation, treatment and answering questions  Delia Heady, MD Medical Director [facility] Pager: [phone] 06/25/2016 4:13 PM  To contact Stroke Continuity provider, please refer to [email or web address]. After hours, contact General Neurology

## 2016-06-25 NOTE — Evaluation (Signed)
Physical Therapy Evaluation Patient Details Name: Tammy JarvisGretchen K Hines MRN: [id number] DOB: [birth year] Today's Date: 06/25/2016   History of Present Illness  56 y.o.femalewith who presented to the ED with sudden onset right sided facial droop, right hand weakness. MRI shows L posterior frontal cortical and subcorticalinfarct embolic secondary to unknown source.PMH: anxiety, depression, borderline personality / bipolar disorder 1 , HTN, HLD, asthma.  Clinical Impression  Pt admitted with above diagnosis and with functional limitations due to the deficits listed below (see PT Problem List). Pt able to ambulate 70 ft with rollator with good stability. Distance limited by fatigue at this time. Anticipating that the pt will D/C to home with family support following her acute stay. Pt will benefit from skilled PT to increase their independence and safety with mobility to allow discharge to home.      Follow Up Recommendations Home health PT;Supervision for mobility/OOB    Equipment Recommendations  None recommended by PT    Recommendations for Other Services       Precautions / Restrictions Precautions Precautions: Fall Restrictions Weight Bearing Restrictions: No      Mobility  Bed Mobility               General bed mobility comments: sitting EOB with OT upon arrival  Transfers Overall transfer level: Needs assistance Equipment used: 4-wheeled walker Transfers: Sit to/from Stand Sit to Stand: Min guard         General transfer comment: good stability with standing.   Ambulation/Gait Ambulation/Gait assistance: Min guard Ambulation Distance (Feet): 70 Feet (plus 12 ft in room) Assistive device: 4-wheeled walker Gait Pattern/deviations: Step-through pattern Gait velocity: decreased   General Gait Details: Pt with even strides, no loss of balance, distance limited by reports of fatigue.   Stairs            Wheelchair Mobility    Modified Rankin (Stroke  Patients Only) Modified Rankin (Stroke Patients Only) Pre-Morbid Rankin Score: Moderate disability Modified Rankin: Moderately severe disability     Balance Overall balance assessment: Needs assistance Sitting-balance support: No upper extremity supported Sitting balance-Leahy Scale: Good     Standing balance support: During functional activity Standing balance-Leahy Scale: Fair Standing balance comment: using rollator for ambulation                             Pertinent Vitals/Pain Pain Assessment: No/denies pain    Home Living Family/patient expects to be discharged to:: Private residence Living Arrangements: Spouse/significant other Available Help at Discharge: Family;Available 24 hours/day Type of Home: Mobile home Home Access: Ramped entrance     Home Layout: One level Home Equipment: Walker - 4 wheels Additional Comments: Pt reports that her significant other has physical limitations as well.     Prior Function Level of Independence: Independent with assistive device(s)         Comments: using rollator PTA     Hand Dominance        Extremity/Trunk Assessment   Upper Extremity Assessment Upper Extremity Assessment: Defer to OT evaluation    Lower Extremity Assessment Lower Extremity Assessment: RLE deficits/detail;LLE deficits/detail RLE Deficits / Details: dorsiflexion 4+/5, knee extension limited by pain, hip flexion 4+/5 RLE Sensation:  (intact to light touch) RLE Coordination:  (WFL) LLE Deficits / Details: grossly 5/5 LLE Sensation:  (intact. )       Communication   Communication: No difficulties (mild slurred speech)  Cognition Arousal/Alertness: Awake/alert Behavior [facility]  for tasks assessed/performed Overall Cognitive Status: Within Functional Limits for tasks assessed                      General Comments      Exercises     Assessment/Plan    PT Assessment Patient needs continued PT services   PT Problem List Decreased strength;Decreased activity tolerance;Decreased balance;Decreased mobility       PT Treatment Interventions DME instruction;Gait training;Functional mobility training;Therapeutic activities;Therapeutic exercise;Balance training;Neuromuscular re-education;Patient/family education    PT Goals (Current goals can be found in the Care Plan section)  Acute Rehab PT Goals Patient Stated Goal: get back home PT Goal Formulation: With patient Time For Goal Achievement: 07/09/16 Potential to Achieve Goals: Good    Frequency Min 4X/week   Barriers to discharge        Co-evaluation               End of Session Equipment Utilized During Treatment: Gait belt Activity Tolerance: Patient tolerated treatment well;Patient limited by fatigue Patient left: in chair;with call bell/phone within reach;with chair alarm set;with family/visitor present Nurse Communication: Mobility status PT Visit Diagnosis: Unsteadiness on feet (R26.81);Muscle weakness (generalized) (M62.81)    Functional Assessment Tool Used: AM-PAC 6 Clicks Basic Mobility;Clinical judgement Functional Limitation: Mobility: Walking and moving around Mobility: Walking and Moving Around Current Status (R6045): At least 40 percent but less than 60 percent impaired, limited or restricted Mobility: Walking and Moving Around Goal Status [phone]): At least 20 percent but less than 40 percent impaired, limited or restricted    Time: 1414-1432 PT Time Calculation (min) (ACUTE ONLY): 18 min   Charges:   PT Evaluation $PT Eval Moderate Complexity: 1 Procedure     PT G Codes:   PT G-Codes **NOT FOR INPATIENT CLASS** Functional Assessment Tool Used: AM-PAC 6 Clicks Basic Mobility;Clinical judgement Functional Limitation: Mobility: Walking and moving around Mobility: Walking and Moving Around Current Status (X9147): At least 40 percent but less than 60 percent impaired, limited or restricted Mobility: Walking  and Moving Around Goal Status [phone]): At least 20 percent but less than 40 percent impaired, limited or restricted     Christiane Ha, PT, CSCS Pager [phone] Office [phone]  06/25/2016, 2:55 PM

## 2016-06-25 NOTE — Evaluation (Signed)
Occupational Therapy Evaluation Patient Details Name: Tammy Hines MRN: [id number] DOB: [birth year] Today's Date: 06/25/2016    History of Present Illness 56 y.o.femalewith who presented to the ED with sudden onset right sided facial droop, right hand weakness. MRI shows L posterior frontal cortical and subcorticalinfarct embolic secondary to unknown source.PMH: anxiety, depression, borderline personality / bipolar disorder 1 , HTN, HLD, asthma.   Clinical Impression   PTA, pt was independent with assistive devices for ADL and functional mobility. She lives with her boyfriend who is able to provide supervision but not physical assistance due to his own physical limitations per pt report. She currently requires supervision for toilet transfers and max assist for LB ADL. She additionally presents with decreased R UE strength and decreased activity tolerance for ADL limiting [facility]. She would benefit from continued OT services while admitted to improve independence with ADL and functional mobility in preparation for return home with home health OT services and supervision from boyfriend.    Follow Up Recommendations  Home health OT;Supervision/Assistance - 24 hour    Equipment Recommendations  3 in 1 bedside commode (wide)    Recommendations for Other Services       Precautions / Restrictions Precautions Precautions: Fall Restrictions Weight Bearing Restrictions: No      Mobility Bed Mobility Overal bed mobility: Needs Assistance Bed Mobility: Supine to Sit     Supine to sit: Supervision     General bed mobility comments: sitting EOB with OT upon arrival  Transfers Overall transfer level: Needs assistance Equipment used: 4-wheeled walker Transfers: Sit to/from Stand Sit to Stand: Supervision         General transfer comment: good stability with standing.     Balance Overall balance assessment: Needs assistance Sitting-balance  support: No upper extremity supported Sitting balance-Leahy Scale: Good     Standing balance support: During functional activity Standing balance-Leahy Scale: Fair Standing balance comment: Able to statically stand without UE support for grooming tasks at sink.                            ADL Overall ADL's : Needs assistance/impaired Eating/Feeding: Set up;Sitting   Grooming: Set up;Sitting   Upper Body Bathing: Supervision/ safety;Sitting   Lower Body Bathing: Maximal assistance;Sit to/from stand   Upper Body Dressing : Supervision/safety;Sitting   Lower Body Dressing: Maximal assistance;Sit to/from stand   Toilet Transfer: Ambulation;RW;BSC;Supervision/safety   Toileting- ArchitectClothing Manipulation and Hygiene: Supervision/safety;Sit to/from stand       Functional mobility during ADLs: Supervision/safety General ADL Comments: Educated pt on fall prevention strategies to incorporate into daily routine. She demonstrates decreased activity tolerance for ADL.     Vision Patient Visual Report: No change from baseline Vision Assessment?: No apparent visual deficits Additional Comments: Able to use vision functionally on assessment.      Perception     Praxis      Pertinent Vitals/Pain Pain Assessment: No/denies pain     Hand Dominance Right   Extremity/Trunk Assessment Upper Extremity Assessment Upper Extremity Assessment: RUE deficits/detail;Generalized weakness RUE Deficits / Details: Slightly decreased strength as compared to L UE grossly 4-/5. Sensation and coordination in tact.   Lower Extremity Assessment Lower Extremity Assessment: Defer to PT evaluation RLE Deficits / Details: dorsiflexion 4+/5, knee extension limited by pain, hip flexion 4+/5 RLE Sensation:  (intact to light touch) RLE Coordination:  (WFL) LLE Deficits / Details: grossly 5/5 LLE Sensation:  (intact. )  Communication Communication Communication: Expressive difficulties  (mild slurred speech)   Cognition Arousal/Alertness: Awake/alert Behavior During Therapy: WFL for tasks assessed/performed Overall Cognitive Status: Within Functional Limits for tasks assessed                     General Comments       Exercises       Shoulder Instructions      Home Living Family/patient expects to be discharged to:: Private residence Living Arrangements: Spouse/significant other Available Help at Discharge: Family;Available 24 hours/day Type of Home: Mobile home Home Access: Ramped entrance     Home Layout: One level     Bathroom Shower/Tub: Tub/shower unit Shower/tub characteristics: Curtain Firefighter: Standard     Home Equipment: Environmental consultant - 4 wheels   Additional Comments: Pt reports that her significant other has physical limitations as well.   Lives With: Significant other    Prior Functioning/Environment Level of Independence: Independent with assistive device(s)        Comments: using rollator PTA        OT Problem List: Decreased strength;Decreased activity tolerance;Impaired balance (sitting and/or standing);Impaired vision/perception      OT Treatment/Interventions: Self-care/ADL training;Therapeutic exercise;Energy conservation;DME and/or AE instruction;Therapeutic activities;Patient/family education;Balance training    OT Goals(Current goals can be found in the care plan section) Acute Rehab OT Goals Patient Stated Goal: get back home OT Goal Formulation: With patient Time For Goal Achievement: 07/02/16 Potential to Achieve Goals: Good ADL Goals Pt Will Perform Grooming: with modified independence;standing (3 tasks) Pt Will Perform Lower Body Bathing: with modified independence;sit to/from stand;with adaptive equipment Pt Will Perform Lower Body Dressing: with modified independence;with adaptive equipment;sit to/from stand Pt Will Transfer to Toilet: with modified independence;ambulating;bedside commode Pt Will  Perform Toileting - Clothing Manipulation and hygiene: with modified independence;sit to/from stand Pt Will Perform Tub/Shower Transfer: Tub transfer;shower seat;rolling walker;with modified independence  OT Frequency: Min 2X/week   Barriers to D/C:            Co-evaluation PT/OT/SLP Co-Evaluation/Treatment:  (Dovetailed session with PT)            End of Session Equipment Utilized During Treatment: Gait belt;Rolling walker Nurse Communication: Mobility status  Activity Tolerance: Patient tolerated treatment well Patient left:  (ambulating with PT)  OT Visit Diagnosis: Unsteadiness on feet (R26.81);Muscle weakness (generalized) (M62.81);Hemiplegia and hemiparesis Hemiplegia - Right/Left: Right Hemiplegia - dominant/non-dominant: Dominant Hemiplegia - caused by: Cerebral infarction                ADL either performed or assessed with clinical judgement  Time: 1400-1420 OT Time Calculation (min): 20 min Charges:  OT General Charges $OT Visit: 1 Procedure OT Evaluation $OT Eval Moderate Complexity: 1 Procedure G-Codes: OT G-codes **NOT FOR INPATIENT CLASS** Functional Assessment Tool Used: AM-PAC 6 Clicks Daily Activity Functional Limitation: Self care Self Care Current Status (Z6109): At least 20 percent but less than 40 percent impaired, limited or restricted Self Care Goal Status (U0454): At least 1 percent but less than 20 percent impaired, limited or restricted   Doristine Section, MS OTR/L  Pager: [phone]   Lakie Mclouth A Evaristo Tsuda 06/25/2016, 5:16 PM

## 2016-06-25 NOTE — Progress Notes (Signed)
Patient's husband states she picks at wounds on arm and he would like the wounds covered. Foam dressings applied to bilateral forearms.

## 2016-06-25 NOTE — Evaluation (Signed)
Speech Language Pathology Evaluation Patient Details Name: Tammy JarvisGretchen K Busenbark MRN: [id number] DOB: [birth year] Today's Date: 06/25/2016 Time: 1030-1055 SLP Time Calculation (min) (ACUTE ONLY): 25 min  Problem List:  Patient Active Problem List   Diagnosis Date Noted  . Acute ischemic stroke (HCC) 06/24/2016  . Bipolar disorder (HCC) 06/24/2016  . Hypertension 06/24/2016  . Hyperlipidemia 06/24/2016  . Anxiety 06/24/2016  . Facial droop   . GERD without esophagitis   . SORE THROAT 12/04/2008  . VIRAL URI 03/04/2008  . BLURRED VISION 10/22/2007  . FATIGUE 10/22/2007  . DISC DISEASE, LUMBAR 09/06/2007  . COUGH 04/20/2007  . Depression 11/19/2006  . Asthma 11/19/2006  . GERD 11/19/2006  . HEADACHE 11/19/2006  . HEPATITIS B, HX OF 11/19/2006   Past Medical History:  Past Medical History:  Diagnosis Date  . Anxiety   . Arthritis    "right knee, left hip, going down my neck into my right shoulder" (06/24/2016)  . Asthma   . Bipolar 1 disorder (HCC)   . Chronic lower back pain   . Chronic neck pain   . COPD (chronic obstructive pulmonary disease) (HCC)   . Depression    [name]/notes 06/24/2016  . Family history of adverse reaction to anesthesia    "makes my mom throw up"   . Fibromyalgia   . GERD (gastroesophageal reflux disease)    [name]/notes 06/24/2016  . Hepatitis B   . History of hiatal hernia   . History of stomach ulcers   . History of transient ischemic attack (TIA)    /RN 06/24/2016  . Hyperlipidemia   . Hypertension   . Ischemic stroke (HCC)    acute/notes 06/24/2016; "left sided weakness"/RN (06/24/2016  . Migraine    "a few/year now" (06/24/2016)  . Obesity   . PONV (postoperative nausea and vomiting)    Past Surgical History:  Past Surgical History:  Procedure Laterality Date  . ANAL FISSURE REPAIR    . FRACTURE SURGERY    . MULTIPLE TOOTH EXTRACTIONS  2018   "I had 6 teeth pulled"  . SHOULDER ARTHROSCOPY W/ ROTATOR CUFF REPAIR Right 2000  . SHOULDER SURGERY  Right [id number]   HPI:  56 year old female came to ED with dysarthria, right facial asymmetry.  MRI Small patchy areas of acute infarct in the left posterior frontal lobe.   Assessment / Plan / Recommendation Clinical Impression  Pt presents with a very mild dysarthria of speech, impacting clarity but not impacting intelligibility.  Expression is fluent; language intact; comprehension WNL.  No SLP f/u for speech is warranted.  Pt is scheduled for MBS this afternoon due to her complaints of coughing with POs last night and this am.     SLP Assessment  SLP Recommendation/Assessment: Patient does not need any further Speech Lanaguage Pathology Services SLP Visit Diagnosis: Dysarthria and anarthria (R47.1)    Follow Up Recommendations    n/a   Frequency and Duration           SLP Evaluation Cognition  Overall Cognitive Status: Within Functional Limits for tasks assessed Arousal/Alertness: Awake/alert Orientation Level: Oriented X4 Attention: Selective Selective Attention: Appears intact Awareness: Appears intact       Comprehension  Auditory Comprehension Overall Auditory Comprehension: Appears within functional limits for tasks assessed Yes/No Questions: Within Functional Limits Commands: Within Functional Limits Conversation: Complex Visual Recognition/Discrimination Discrimination: Within Function Limits    Expression Expression Primary Mode of Expression: Verbal Verbal Expression Overall Verbal Expression: Appears within functional limits for tasks assessed  Initiation: No impairment Level of Generative/Spontaneous Verbalization: Conversation Repetition: No impairment Naming: No impairment Pragmatics: No impairment Written Expression Dominant Hand: Right   Oral / Motor  Oral Motor/Sensory Function Overall Oral Motor/Sensory Function: Mild impairment Facial ROM: Reduced right;Suspected CN VII (facial) dysfunction Facial Symmetry: Abnormal symmetry right;Suspected CN VII  (facial) dysfunction Facial Strength: Reduced right;Suspected CN VII (facial) dysfunction Lingual Symmetry: Abnormal symmetry right;Suspected CN XII (hypoglossal) dysfunction Motor Speech Overall Motor Speech: Impaired Respiration: Within functional limits Phonation: Normal Resonance: Within functional limits Articulation: Impaired Level of Impairment: Conversation Intelligibility: Intelligible Word: 75-100% accurate Sentence: 75-100% accurate Conversation: 75-100% accurate   GO          Functional Assessment Tool Used: clinical judgment Functional Limitations: Spoken language expressive Spoken Language Expression Current Status [phone]): At least 1 percent but less than 20 percent impaired, limited or restricted Spoken Language Expression Goal Status [phone]): At least 1 percent but less than 20 percent impaired, limited or restricted Spoken Language Expression Discharge Status [phone]): At least 1 percent but less than 20 percent impaired, limited or restricted         Blenda Mounts Laurice 06/25/2016, 11:05 AM

## 2016-06-26 ENCOUNTER — Inpatient Hospital Stay (HOSPITAL_COMMUNITY): Payer: Medicare Other

## 2016-06-26 ENCOUNTER — Encounter (HOSPITAL_COMMUNITY)
Admission: EM | Disposition: A | Discharge status: Home / Self Care | Payer: Self-pay | Source: Home / Self Care | Attending: Internal Medicine

## 2016-06-26 ENCOUNTER — Encounter (HOSPITAL_COMMUNITY): Payer: Self-pay | Admitting: *Deleted

## 2016-06-26 DIAGNOSIS — I638 Other cerebral infarction: Secondary | ICD-10-CM

## 2016-06-26 DIAGNOSIS — E784 Other hyperlipidemia: Secondary | ICD-10-CM

## 2016-06-26 DIAGNOSIS — I639 Cerebral infarction, unspecified: Secondary | ICD-10-CM

## 2016-06-26 HISTORY — PX: TEE WITHOUT CARDIOVERSION: SHX5443

## 2016-06-26 HISTORY — PX: LOOP RECORDER INSERTION: EP1214

## 2016-06-26 LAB — BASIC METABOLIC PANEL
Anion gap: 8 (ref 5–15)
BUN: 12 mg/dL (ref 6–20)
CHLORIDE: 108 mmol/L (ref 101–111)
CO2: 27 mmol/L (ref 22–32)
Calcium: 9.2 mg/dL (ref 8.9–10.3)
Creatinine, Ser: 0.8 mg/dL (ref 0.44–1.00)
GFR calc Af Amer: >60 mL/min (ref >60)
GFR calc non Af Amer: >60 mL/min (ref >60)
GLUCOSE: 96 mg/dL (ref 65–99)
POTASSIUM: 4.2 mmol/L (ref 3.5–5.1)
Sodium: 143 mmol/L (ref 135–145)

## 2016-06-26 LAB — HEMOGLOBIN A1C
HEMOGLOBIN A1C: 6.2 % — AB (ref 4.8–5.6)
Mean Plasma Glucose: 131 mg/dL

## 2016-06-26 LAB — PROTIME-INR
INR: 0.98
Prothrombin Time: 13 seconds (ref 11.4–15.2)

## 2016-06-26 SURGERY — LOOP RECORDER INSERTION
Anesthesia: LOCAL

## 2016-06-26 SURGERY — ECHOCARDIOGRAM, TRANSESOPHAGEAL
Anesthesia: Moderate Sedation

## 2016-06-26 MED ORDER — DIPHENHYDRAMINE HCL 50 MG/ML IJ SOLN
INTRAMUSCULAR | Status: AC
  Filled 2016-06-26: qty 1

## 2016-06-26 MED ORDER — MIDAZOLAM HCL 5 MG/ML IJ SOLN
INTRAMUSCULAR | Status: AC
Start: 2016-06-26 — End: 2016-06-26
  Filled 2016-06-26: qty 3

## 2016-06-26 MED ORDER — FENTANYL CITRATE (PF) 100 MCG/2ML IJ SOLN
INTRAMUSCULAR | Status: DC | PRN
  Administered 2016-06-26 (×3): 25 ug via INTRAVENOUS

## 2016-06-26 MED ORDER — LIDOCAINE HCL (PF) 1 % IJ SOLN
INTRAMUSCULAR | Status: AC
Start: 2016-06-26 — End: 2016-06-26
  Filled 2016-06-26: qty 30

## 2016-06-26 MED ORDER — MIDAZOLAM HCL 10 MG/2ML IJ SOLN
INTRAMUSCULAR | Status: DC | PRN
  Administered 2016-06-26: 2 mg via INTRAVENOUS
  Administered 2016-06-26: 1 mg via INTRAVENOUS
  Administered 2016-06-26 (×2): 2 mg via INTRAVENOUS

## 2016-06-26 MED ORDER — FENTANYL CITRATE (PF) 100 MCG/2ML IJ SOLN
INTRAMUSCULAR | Status: AC
  Filled 2016-06-26: qty 2

## 2016-06-26 MED ORDER — LIDOCAINE HCL (PF) 1 % IJ SOLN
INTRAMUSCULAR | Status: DC | PRN
  Administered 2016-06-26: 20 mL

## 2016-06-26 MED ORDER — BUTAMBEN-TETRACAINE-BENZOCAINE 2-2-14 % EX AERO
INHALATION_SPRAY | CUTANEOUS | Status: DC | PRN
  Administered 2016-06-26: 2 via TOPICAL

## 2016-06-26 SURGICAL SUPPLY — 2 items
LOOP REVEAL LINQSYS (Prosthesis & Implant Heart) ×2 IMPLANT
PACK LOOP INSERTION (CUSTOM PROCEDURE TRAY) ×2 IMPLANT

## 2016-06-26 NOTE — Consult Note (Signed)
ELECTROPHYSIOLOGY CONSULT NOTE  Patient ID: Tammy Hines MRN: [id number], DOB/AGE: 1961/02/29   Admit date: 06/24/2016 Date of Consult: 06/26/2016  Primary Physician: Quitman Livings, MD Primary Cardiologist: Dr. Antoine Poche Reason for Consultation: Cryptogenic stroke - recommendations regarding Implantable Loop Recorder Requesting MD: Dr. Pearlean Brownie  History of Present Illness Tammy Hines was admitted on 06/24/2016 with acute onset facial droop, slurred speech and R hand weakness while at home .  PMHx noted for HTN, HLD, obesity, bipolar disorder, anxiety, GERD, asthma, smoker, very sedentary life style.  She first developed symptoms while at home, noted when drinking water was dribbling out of her mouth.    Imaging demonstrated  L posterior frontal cortical and subcortical infarct embolic secondary to unknown source, workup underway.  she has undergone workup for stroke including echocardiogram/TEE and carotid dopplers.  The patient has been monitored on telemetry which has demonstrated sinus rhythm with no arrhythmias.  Inpatient stroke work-up is to be completed with a LE doppler given small PFO on TEE.  Echocardiogram this admission demonstrated  Study Conclusions - Left ventricle: The cavity size was normal. Wall thickness was   normal. Systolic function was normal. The estimated ejection   fraction was in the range of 60% to 65%. Wall motion was normal;   there were no regional wall motion abnormalities. Doppler   parameters are consistent with abnormal left ventricular   relaxation (grade 1 diastolic dysfunction). - Aortic valve: There was no stenosis. - Mitral valve: There was no significant regurgitation. - Right ventricle: The cavity size was normal. Systolic function   was normal. - Pulmonary arteries: No complete TR doppler jet so unable to   estimate PA systolic pressure. - Inferior vena cava: The vessel was normal in size. The   respirophasic diameter changes were in the  normal range (>= 50%),   consistent with normal central venous pressure. Impressions: - Normal LV size and systolic function, EF 60-65%. Normal RV size   and systolic function. No significant valvular abnormalities.  TEE today FINDINGS:  There is a small PFO with small bidirectional shunt. Otherwise normal study.  Lab work is reviewed.  Prior to admission, the patient denies chest pain, shortness of breath, dizziness, palpitations, or syncope.  They are recovering from their stroke with plans to home at discharge.  EP has been asked to evaluate for placement of an implantable loop recorder to monitor for atrial fibrillation.   Past Medical History:  Diagnosis Date  . Anxiety   . Arthritis    "right knee, left hip, going down my neck into my right shoulder" (06/24/2016)  . Asthma   . Bipolar 1 disorder (HCC)   . Chronic lower back pain   . Chronic neck pain   . COPD (chronic obstructive pulmonary disease) (HCC)   . Depression    [name] 06/24/2016  . Family history of adverse reaction to anesthesia    "makes my mom throw up"   . Fibromyalgia   . GERD (gastroesophageal reflux disease)    [name] 06/24/2016  . Hepatitis B   . History of hiatal hernia   . History of stomach ulcers   . History of transient ischemic attack (TIA)    /RN 06/24/2016  . Hyperlipidemia   . Hypertension   . Ischemic stroke (HCC)    acute/notes 06/24/2016; "left sided weakness"/RN (06/24/2016  . Migraine    "a few/year now" (06/24/2016)  . Obesity   . PONV (postoperative nausea and vomiting)  Surgical History:  Past Surgical History:  Procedure Laterality Date  . ANAL FISSURE REPAIR    . FRACTURE SURGERY    . MULTIPLE TOOTH EXTRACTIONS  2018   "I had 6 teeth pulled"  . SHOULDER ARTHROSCOPY W/ ROTATOR CUFF REPAIR Right 2000  . SHOULDER SURGERY Right 1999     Prescriptions Prior to Admission  Medication Sig Dispense Refill Last Dose  . albuterol (PROVENTIL HFA;VENTOLIN HFA) 108 (90 Base)  MCG/ACT inhaler Inhale 2 puffs into the lungs every 6 (six) hours as needed for wheezing or shortness of breath.   rescue  . alprazolam (XANAX) 2 MG tablet Take 2 mg by mouth 3 (three) times daily.    06/23/2016 at Unknown time  . Asenapine Maleate (SAPHRIS) 10 MG SUBL Place 20 mg under the tongue at bedtime.   06/22/2016  . atenolol (TENORMIN) 100 MG tablet Take 100 mg by mouth at bedtime.   06/23/2016 at 1700  . clomiPRAMINE (ANAFRANIL) 50 MG capsule Take 50 mg by mouth at bedtime. For OCD   06/23/2016 at Unknown time  . dicyclomine (BENTYL) 10 MG capsule Take 10 mg by mouth 3 (three) times daily before meals.    06/23/2016 at Unknown time  . fesoterodine (TOVIAZ) 8 MG TB24 tablet Take 8 mg by mouth every evening.   06/23/2016 at Unknown time  . FLUoxetine (PROZAC) 40 MG capsule Take 40 mg by mouth 3 (three) times daily.    06/23/2016 at Unknown time  . furosemide (LASIX) 20 MG tablet Take 20 mg by mouth every morning.    06/23/2016 at Unknown time  . gabapentin (NEURONTIN) 400 MG capsule Take 400 mg by mouth 4 (four) times daily.   06/23/2016 at Unknown time  . MYRBETRIQ 25 MG TB24 tablet Take 25 mg by mouth every evening.    06/23/2016 at Unknown time  . naproxen (NAPROSYN) 500 MG tablet Take 500 mg by mouth 2 (two) times daily.   06/23/2016 at Unknown time  . omeprazole (PRILOSEC) 20 MG capsule Take 40 mg by mouth daily.    06/23/2016 at Unknown time  . oxyCODONE-acetaminophen (PERCOCET) 10-325 MG tablet Take 1 tablet by mouth 3 (three) times daily. Scheduled 7a, 12p, 5p   06/23/2016 at Unknown time  . simvastatin (ZOCOR) 20 MG tablet Take 20 mg by mouth at bedtime.    06/22/2016    Inpatient Medications:  . alprazolam  2 mg Oral TID  . asenapine  20 mg Sublingual QHS  . aspirin EC  325 mg Oral Daily  . clomiPRAMINE  50 mg Oral QHS  . dicyclomine  10 mg Oral TID AC  . fesoterodine  8 mg Oral QPM  . FLUoxetine  40 mg Oral TID  . gabapentin  400 mg Oral QID  . heparin  5,000 Units Subcutaneous Q8H  .  pantoprazole  40 mg Oral Daily  . protein supplement shake  11 oz Oral Q24H  . simvastatin  20 mg Oral QHS    Allergies:  Allergies  Allergen Reactions  . Ampicillin Hives and Nausea And Vomiting    Has patient had a PCN reaction causing immediate rash, facial/tongue/throat swelling, SOB or lightheadedness with hypotension: No Has patient had a PCN reaction causing severe rash involving mucus membranes or skin necrosis: No Has patient had a PCN reaction that required hospitalization No Has patient had a PCN reaction occurring within the last 10 years: No If all of the above answers are "NO", then may proceed with Cephalosporin use.   Marland Kitchen  Cleocin [Clindamycin Hcl]     "deathly sick"  . Clonazepam Other (See Comments)    Pt does not remember what the reaction was  . Other Other (See Comments)    Allergy to mold, down and feathers per allergy test  . Penicillins Other (See Comments)    Has patient had a PCN reaction causing immediate rash, facial/tongue/throat swelling, SOB or lightheadedness with hypotension: No Has patient had a PCN reaction causing severe rash involving mucus membranes or skin necrosis: No Has patient had a PCN reaction that required hospitalization No Has patient had a PCN reaction occurring within the last 10 years: No If all of the above answers are "NO", then may proceed with Cephalosporin use.   Pt told not to take because of reaction to ampicillin - no  . Bupropion Hcl Itching and Anxiety    Social History   Social History  . Marital status: Single    Spouse name: N/A  . Number of children: N/A  . Years of education: N/A   Occupational History  . Not on file.   Social History Main Topics  . Smoking status: Current Every Day Smoker    Packs/day: 0.33    Years: 25.00    Types: Cigarettes  . Smokeless tobacco: Never Used  . Alcohol use 0.0 oz/week     Comment: 06/24/2016 "I'll have a drink once/year"  . Drug use: No  . Sexual activity: No    Other Topics Concern  . Not on file   Social History Narrative   Lives alone.       Family History  Problem Relation Age of Onset  . Hypertension Mother   . Cancer Father     esophageal  . Heart attack Maternal Grandfather       Review of Systems: All other systems reviewed and are otherwise negative except as noted above.  Physical Exam: Vitals:   06/26/16 1146 06/26/16 1150 06/26/16 1200 06/26/16 1219  BP: (!) 137/91 140/81 137/81 140/85  Pulse: 99  95 91  Resp: 16  15 14   Temp: 98.3 F (36.8 C)     TempSrc: Oral     SpO2: 97%  92% 92%  Weight:      Height:        GEN- The patient is well appearing, alert and oriented x 3 today.   Head- normocephalic, atraumatic Eyes-  Sclera clear, conjunctiva pink Ears- hearing intact Oropharynx- clear Neck- supple Lungs- CTA b/l, a slight end exp wheeze b/l, normal work of breathing Heart- RRR, no murmurs, rubs or gallops  GI- soft, NT, ND Extremities- no clubbing, cyanosis, or edema MS- no significant deformity or atrophy Skin- no rash or lesion Psych- euthymic mood, full affect   Labs:   Lab Results  Component Value Date   WBC 10.5 06/25/2016   HGB 12.6 06/25/2016   HCT 40.2 06/25/2016   MCV 88.0 06/25/2016   PLT 268 06/25/2016    Recent Labs Lab 06/24/16 0406  06/26/16 0404  NA 136  < > 143  K 3.8  < > 4.2  CL 100*  < > 108  CO2 27  < > 27  BUN 18  < > 12  CREATININE 0.86  < > 0.80  CALCIUM 9.0  < > 9.2  PROT 6.2*  --   --   BILITOT 0.5  --   --   ALKPHOS 73  --   --   ALT 25  --   --  AST 23  --   --   GLUCOSE 109*  < > 96  < > = values in this interval not displayed. No results found for: CKTOTAL, CKMB, CKMBINDEX, TROPONINI Lab Results  Component Value Date   CHOL 177 06/25/2016   Lab Results  Component Value Date   HDL 38 (L) 06/25/2016   Lab Results  Component Value Date   LDLCALC 98 06/25/2016   Lab Results  Component Value Date   TRIG 207 (H) 06/25/2016   Lab Results   Component Value Date   CHOLHDL 4.7 06/25/2016   No results found for: LDLDIRECT  Lab Results  Component Value Date   DDIMER 0.47 08/30/2015     Radiology/Studies:  Ct Angio Head W Or Wo Contrast Result Date: 06/25/2016 CLINICAL DATA:  Posterior left frontal lobe infarct. Right facial droop and right hand weakness. EXAM: CT ANGIOGRAPHY HEAD TECHNIQUE: Multidetector CT imaging of the head was performed using the standard protocol during bolus administration of intravenous contrast. Multiplanar CT image reconstructions and MIPs were obtained to evaluate the vascular anatomy. CONTRAST:  150 mL Isovue 370. The initial bolus was suboptimal. Therefore two 75 mL doses were used. COMPARISON:  MRI brain 06/24/2016 FINDINGS: CT HEAD Brain: A left frontal lobe infarct is stable. No acute hemorrhage or mass lesion is present. The insular ribbon is intact. The basal ganglia are intact. There is no significant extension of the infarct. No significant mass effect is present. Ventricles are of normal size. The brainstem and cerebellum are unremarkable. Vascular: No hyperdense vessel or unexpected calcification. Skull: The calvarium is normal. No focal lytic or blastic lesions are present. Sinuses: The paranasal sinuses and mastoid air cells are clear. Orbits: The globes and orbits are unremarkable. CTA HEAD Anterior circulation: The internal carotid arteries are within normal limits from the high cervical segments through the ICA termini bilaterally. The A1 and M1 segments are normal. The anterior communicating artery is patent. MCA bifurcations are within normal limits. The ACA and MCA branch vessels are normal. No significant proximal stenosis or occlusion is present to explain the left MCA territory infarct. Posterior circulation: The vertebral arteries are codominant. The PICA origins are visualized and normal bilaterally. Basilar artery is normal. The right posterior cerebral artery is of fetal type. A right  posterior communicating artery is present. The left posterior cerebral artery is of fetal type. The PCA branch vessels are within normal limits. Venous sinuses: The dural sinuses are patent. The left transverse sinus is dominant. The straight sinus and deep cerebral veins are intact. Anatomic variants: Fetal type left posterior cerebral artery. Delayed phase: The postcontrast images demonstrate no pathologic enhancement. The infarct is better visualized on the postcontrast images. IMPRESSION: 1. Left frontal lobe infarct. 2. Normal variant CTA circle of Willis. No significant proximal stenosis, aneurysm, or branch vessel occlusion. Electronically Signed   By: Marin Robertshristopher  Mattern M.D.   On: 06/25/2016 13:29   Ct Head Wo Contrast Result Date: 06/24/2016 CLINICAL DATA:  56 year old female with right facial droop. EXAM: CT HEAD WITHOUT CONTRAST TECHNIQUE: Contiguous axial images were obtained from the base of the skull through the vertex without intravenous contrast. COMPARISON:  None. FINDINGS: Brain: No evidence of acute infarction, hemorrhage, hydrocephalus, extra-axial collection or mass lesion/mass effect. Vascular: No hyperdense vessel or unexpected calcification. Skull: Normal. Negative for fracture or focal lesion. Sinuses/Orbits: No acute finding. Other: None IMPRESSION: No acute intracranial pathology. Electronically Signed   By: Elgie CollardArash  Radparvar M.D.   On: 06/24/2016 04:07  Mr Brain Wo Contrast Result Date: 06/24/2016 CLINICAL DATA:  Right facial droop.  Right hand weakness.  Stroke. EXAM: MRI HEAD WITHOUT CONTRAST TECHNIQUE: Multiplanar, multiecho pulse sequences of the brain and surrounding structures were obtained without intravenous contrast. COMPARISON:  CT head 06/24/2016 FINDINGS: Brain: Acute infarct in the left posterior frontal cortex and white matter. Patchy areas of restricted diffusion are present in this area. No other areas of acute infarct. No significant chronic ischemia. Negative for  hemorrhage or mass. Ventricle size normal. No shift of the midline structures. Vascular: Normal arterial flow void. Skull and upper cervical spine: Negative Sinuses/Orbits: Negative Other: None IMPRESSION: Small patchy areas of acute infarct in the left posterior frontal cortex and white matter. Negative for hemorrhage. Electronically Signed   By: Marlan Palau M.D.   On: 06/24/2016 08:10   Dg Chest Port 1 View Result Date: 06/24/2016 CLINICAL DATA:  Wheezing starting this morning EXAM: PORTABLE CHEST 1 VIEW COMPARISON:  08/30/2015 FINDINGS: Cardiomediastinal silhouette is stable. No infiltrate or pleural effusion. No pulmonary edema. Bony thorax is unremarkable. IMPRESSION: No active disease. Electronically Signed   By: Natasha Mead M.D.   On: 06/24/2016 12:01               12-lead ECG SR All prior EKG's in EPIC reviewed with no documented atrial fibrillation Telemetry SR/ST, lead loss/artifact intermittently  Assessment and Plan:   1. Cryptogenic stroke The patient presents with cryptogenic stroke.  The patient has a TEE planned for this AM.  I spoke at length with the patient about monitoring for afib with either a 30 day event monitor or an implantable loop recorder.  Risks, benefits, and alteratives to implantable loop recorder were discussed with the patient today.   At this time, the patient is very clear in their decision to proceed with implantable loop recorder.    Wound care was reviewed with the patient (keep incision clean and dry for 3 days).  Wound check is scheduled for the patient.   Please call with questions.   Renee Norberto Sorenson, PA-C 06/26/2016   I have seen, examined the patient, and reviewed the above assessment and plan.  On exam, RRR.  Changes to above are made where necessary.  Pt with small PFO on TEE but no DVT on dopplers.  Given concerns for embolic stroke, I agree with Dr Pearlean Brownie that ILR is indicated.  Risks of ILR discussed with the patient who wishes to  proceed.  Co Sign: Hillis Range, MD 06/26/2016 4:07 PM

## 2016-06-26 NOTE — Interval H&P Note (Signed)
History and Physical Interval Note:  06/26/2016 10:52 AM  Tammy Hines  has presented today for surgery, with the diagnosis of STROKE  The various methods of treatment have been discussed with the patient and family. After consideration of risks, benefits and other options for treatment, the patient has consented to  Procedure(s): TRANSESOPHAGEAL ECHOCARDIOGRAM (TEE) (N/A) as a surgical intervention .  The patient's history has been reviewed, patient examined, no change in status, stable for surgery.  I have reviewed the patient's chart and labs.  Questions were answered to the patient's satisfaction.     Amalea Ottey

## 2016-06-26 NOTE — Progress Notes (Signed)
Physical Therapy Treatment Patient Details Name: Tammy Hines MRN: [id number] DOB: July 05, 1960 Today's Date: 06/26/2016    History of Present Illness 56 y.o.femalewith who presented to the ED with sudden onset right sided facial droop, right hand weakness. MRI shows L posterior frontal cortical and subcorticalinfarct embolic secondary to unknown source.PMH: anxiety, depression, borderline personality / bipolar disorder 1 , HTN, HLD, asthma.    PT Comments    Patient improving with mobility and gait endurance.  Feel will be safe at home with follow up to address deficits in strength and endurance.  PT to follow acutely.   Follow Up Recommendations  Home health PT;Supervision for mobility/OOB     Equipment Recommendations  None recommended by PT    Recommendations for Other Services       Precautions / Restrictions Precautions Precautions: Fall    Mobility  Bed Mobility         Supine to sit: Modified independent (Device/Increase time)     General bed mobility comments: able to direct adjustement of bed for ease of getting into bed and safely entered unaided  Transfers   Equipment used: 4-wheeled walker Transfers: Sit to/from Stand Sit to Stand: Modified independent (Device/Increase time)            Ambulation/Gait Ambulation/Gait assistance: Min guard Ambulation Distance (Feet): 120 Feet Assistive device: 4-wheeled walker Gait Pattern/deviations: Step-through pattern;Decreased stride length;Shuffle;Trunk flexed;Wide base of support     General Gait Details: assist for safety, no c/o fatigue this session   Stairs            Wheelchair Mobility    Modified Rankin (Stroke Patients Only) Modified Rankin (Stroke Patients Only) Pre-Morbid Rankin Score: Moderate disability Modified Rankin: Moderately severe disability     Balance Overall balance assessment: Needs assistance   Sitting balance-Leahy Scale: Good     Standing balance  support: Bilateral upper extremity supported;No upper extremity supported Standing balance-Leahy Scale: Fair Standing balance comment: flexed posture and UE support for balance, but able to stand and attempt to adjust bed prior to entry without UE assist                    Cognition Arousal/Alertness: Awake/alert Behavior During Therapy: Flat affect Overall Cognitive Status: Within Functional Limits for tasks assessed                      Exercises      General Comments        Pertinent Vitals/Pain Pain Assessment: No/denies pain    Home Living                      Prior Function            PT Goals (current goals can now be found in the care plan section) Progress towards PT goals: Progressing toward goals    Frequency    Min 4X/week      PT Plan Current plan remains appropriate    Co-evaluation             End of Session Equipment Utilized During Treatment: Gait belt Activity Tolerance: Patient tolerated treatment well Patient left: in bed;with call bell/phone within reach (prepped for transport to vascular lab)   PT Visit Diagnosis: Unsteadiness on feet (R26.81);Muscle weakness (generalized) (M62.81)     Time: 4098-1191 PT Time Calculation (min) (ACUTE ONLY): 18 min  Charges:  $Gait Training: 8-22 mins  G CodesElray Mcgregor:       Cynthia Wynn 06/26/2016, 3:53 PM  Sheran Lawlessyndi Wynn, PT [phone] 06/26/2016

## 2016-06-26 NOTE — Op Note (Signed)
INDICATIONS: cryptogenic stroke  PROCEDURE:   Informed consent was obtained prior to the procedure. The risks, benefits and alternatives for the procedure were discussed and the patient comprehended these risks.  Risks include, but are not limited to, cough, sore throat, vomiting, nausea, somnolence, esophageal and stomach trauma or perforation, bleeding, low blood pressure, aspiration, pneumonia, infection, trauma to the teeth and death.    After a procedural time-out, the oropharynx was anesthetized with 20% benzocaine spray.   During this procedure the patient was administered a total of Versed 7 mg and Fentanyl 75 mcg to achieve and maintain moderate conscious sedation.  The patient's heart rate, blood pressure, and oxygen saturationweare monitored continuously during the procedure. The period of conscious sedation was 7 minutes, of which I was present face-to-face 100% of this time.  The transesophageal probe was inserted in the esophagus and stomach without difficulty and multiple views were obtained.  The patient was kept under observation until the patient left the procedure room.  The patient left the procedure room in stable condition.   Agitated microbubble saline contrast was administered.  COMPLICATIONS:    There were no immediate complications.  FINDINGS:  There is a small PFO with small bidirectional shunt. Otherwise normal study.  RECOMMENDATIONS:     Proceed with implantable loop recorder.  Time Spent Directly with the Patient:  30 minutes   Eleftheria Taborn 06/26/2016, 11:31 AM

## 2016-06-26 NOTE — Interval H&P Note (Signed)
History and Physical Interval Note:  06/26/2016 4:09 PM  Tammy Hines  has presented today for surgery, with the diagnosis of stroke  The various methods of treatment have been discussed with the patient and family. After consideration of risks, benefits and other options for treatment, the patient has consented to  Procedure(s): Loop Recorder Insertion (N/A) as a surgical intervention .  The patient's history has been reviewed, patient examined, no change in status, stable for surgery.  I have reviewed the patient's chart and labs.  Questions were answered to the patient's satisfaction.     Hillis RangeJames Tritia Endo

## 2016-06-26 NOTE — Progress Notes (Signed)
PROGRESS NOTE   RAESHAWN TAFOLLA  GNF:[id number]    DOB: 1961-01-20    DOA: 06/24/2016  PCP: Quitman Livings, MD   I have briefly reviewed patients previous medical records in [facility].  Brief Narrative:  56 year old female with PMH of severe anxiety, depression, borderline personality/bipolar disorder, HTN, HLD, GERD, asthma, morbid obesity, presented to ED with sudden onset of right-sided facial droop, right hand weakness and slurred speech. In the ED, CT head did not reveal any acute pathology but MRI brain showed acute stroke. Neurology consulted.   Assessment & Plan:   Principal Problem:   Acute ischemic stroke [facility]) Active Problems:   Depression   Asthma   GERD   DISC DISEASE, LUMBAR   Bipolar disorder (HCC)   Hypertension   Hyperlipidemia   Anxiety   Stroke (cerebrum) (HCC)   Acute stroke: Left posterior frontal cortical and subcortical infarct embolic secondary to unknown source. CT head without acute abnormality MRI head: Left posterior frontal cortical and white matter infarct. CTA head: Left frontal lobe infarct. No significant abnormalities. Carotid Doppler: No significant ICA stenosis. Heterogeneous areas noted in bilateral thyroid, consider thyroid ultrasound if clinically necessary-can be pursued as outpatient. 2-D echo: LVEF 60-65 percent. Grade 1 diastolic dysfunction. TEE: Small PFO with small bidirectional shunt. Otherwise normal study. LDL 98 A1c: 6.2 Diet: Advance to regular diet by speech therapy. Not on antithrombotic spread to admission. Aspirin 325 MG daily initiated for secondary stroke prevention. Therapies recommend home health PT, OT and 3 and 1. For cryptogenic stroke, following negative TEE, cardiology was consulted for placement of ILR Bilateral lower extremity venous Dopplers: Negative for DVT.  Essential hypertension Permissive hypertension but gradually normalizing 5-7 days.  Hyperlipidemia LDL 98, goal <70. Continue Zocor 20 MG  and consider increasing dose.  Tobacco abuse Cessation counseled.  Morbid obesity/Body mass index is 47.68 kg/m.  Anxiety/bipolar disorder Stable.  GERD  Chronic pain As per patient's report to RN, she takes scheduled Percocet-continue per home regimen.   DVT prophylaxis: Heparin  Code Status: Full  Family Communication: Discussed with patient's boyfriend and other friend at bedside. Disposition: DC home pending stroke workup, possibly 3/23   Consultants:  Neurology  Cardiology  Procedures:  Carotid Doppler: Summary:  - No significant extracranial carotid artery stenosis demonstrated.   Vertebrals are patent with antegrade flow. - Bilateral thyroid: heterogenous areas noted.Correlate with   thyroid ultrasound if clinically necessary.  2-D echo 06/25/16: Study Conclusions  - Left ventricle: The cavity size was normal. Wall thickness was   normal. Systolic function was normal. The estimated ejection   fraction was in the range of 60% to 65%. Wall motion was normal;   there were no regional wall motion abnormalities. Doppler   parameters are consistent with abnormal left ventricular   relaxation (grade 1 diastolic dysfunction). - Aortic valve: There was no stenosis. - Mitral valve: There was no significant regurgitation. - Right ventricle: The cavity size was normal. Systolic function   was normal. - Pulmonary arteries: No complete TR doppler jet so unable to   estimate PA systolic pressure. - Inferior vena cava: The vessel was normal in size. The   respirophasic diameter changes were in the normal range (>= 50%),   consistent with normal central venous pressure.  Impressions:  - Normal LV size and systolic function, EF 60-65%. Normal RV size   and systolic function. No significant valvular abnormalities.  TEE  Antimicrobials:  None    Subjective: Seen this morning prior to  procedures. Was complaining of dry mouth. Stated that she felt much better.  Denied residual stroke symptoms. No pain reported.  ROS:  No dyspnea.  Objective:  Vitals:   06/26/16 1150 06/26/16 1200 06/26/16 1219 06/26/16 1300  BP: 140/81 137/81 140/85 127/83  Pulse:  95 91 100  Resp:  15 14 20   Temp:    98 F (36.7 C)  TempSrc:    Oral  SpO2:  92% 92% 96%  Weight:      Height:        Examination:  General exam: Pleasant middle-aged female, morbidly obese, sitting up comfortably in bed. Respiratory system: Clear to auscultation. Respiratory effort normal. Cardiovascular system: S1 & S2 heard, RRR. No JVD, murmurs, rubs, gallops or clicks. No pedal edema.Telemetry: Sinus rhythm.  Gastrointestinal system: Abdomen is nondistended, soft and nontender. No organomegaly or masses felt. Normal bowel sounds heard. Central nervous system: Alert and oriented. No dysarthria. Diminished right nasolabial fold. Extremities: Symmetric 5 x 5 power. Skin: No rashes, lesions or ulcers Psychiatry: Judgement and insight appear normal. Mood & affect appropriate.     Data Reviewed: I have personally reviewed following labs and imaging studies  CBC:  Recent Labs Lab 06/24/16 0406 06/24/16 0421 06/25/16 0358  WBC 8.8  --  10.5  NEUTROABS 5.3  --   --   HGB 12.4 12.6 12.6  HCT 39.2 37.0 40.2  MCV 87.3  --  88.0  PLT 228  --  268   Basic Metabolic Panel:  Recent Labs Lab 06/24/16 0406 06/24/16 0421 06/24/16 1150 06/25/16 0358 06/26/16 0404  NA 136 139  --  139 143  K 3.8 3.9  --  4.2 4.2  CL 100* 102  --  106 108  CO2 27  --   --  24 27  GLUCOSE 109* 114*  --  101* 96  BUN 18 19  --  11 12  CREATININE 0.86 0.80 0.86 0.73 0.80  CALCIUM 9.0  --   --  9.4 9.2   Liver Function Tests:  Recent Labs Lab 06/24/16 0406  AST 23  ALT 25  ALKPHOS 73  BILITOT 0.5  PROT 6.2*  ALBUMIN 3.5   Coagulation Profile:  Recent Labs Lab 06/24/16 0406 06/26/16 0404  INR 1.00 0.98   Cardiac Enzymes: No results for input(s): CKTOTAL, CKMB, CKMBINDEX, TROPONINI  in the last 168 hours. HbA1C:  Recent Labs  06/25/16 0358  HGBA1C 6.2*   CBG:  Recent Labs Lab 06/24/16 0308  GLUCAP 131*    No results found for this or any previous visit (from the past 240 hour(s)).       Radiology Studies: Ct Angio Head W Or Wo Contrast  Result Date: 06/25/2016 CLINICAL DATA:  Posterior left frontal lobe infarct. Right facial droop and right hand weakness. EXAM: CT ANGIOGRAPHY HEAD TECHNIQUE: Multidetector CT imaging of the head was performed using the standard protocol during bolus administration of intravenous contrast. Multiplanar CT image reconstructions and MIPs were obtained to evaluate the vascular anatomy. CONTRAST:  150 mL Isovue 370. The initial bolus was suboptimal. Therefore two 75 mL doses were used. COMPARISON:  MRI brain 06/24/2016 FINDINGS: CT HEAD Brain: A left frontal lobe infarct is stable. No acute hemorrhage or mass lesion is present. The insular ribbon is intact. The basal ganglia are intact. There is no significant extension of the infarct. No significant mass effect is present. Ventricles are of normal size. The brainstem and cerebellum are unremarkable. Vascular: No hyperdense vessel or  unexpected calcification. Skull: The calvarium is normal. No focal lytic or blastic lesions are present. Sinuses: The paranasal sinuses and mastoid air cells are clear. Orbits: The globes and orbits are unremarkable. CTA HEAD Anterior circulation: The internal carotid arteries are within normal limits from the high cervical segments through the ICA termini bilaterally. The A1 and M1 segments are normal. The anterior communicating artery is patent. MCA bifurcations are within normal limits. The ACA and MCA branch vessels are normal. No significant proximal stenosis or occlusion is present to explain the left MCA territory infarct. Posterior circulation: The vertebral arteries are codominant. The PICA origins are visualized and normal bilaterally. Basilar artery is  normal. The right posterior cerebral artery is of fetal type. A right posterior communicating artery is present. The left posterior cerebral artery is of fetal type. The PCA branch vessels are within normal limits. Venous sinuses: The dural sinuses are patent. The left transverse sinus is dominant. The straight sinus and deep cerebral veins are intact. Anatomic variants: Fetal type left posterior cerebral artery. Delayed phase: The postcontrast images demonstrate no pathologic enhancement. The infarct is better visualized on the postcontrast images. IMPRESSION: 1. Left frontal lobe infarct. 2. Normal variant CTA circle of Willis. No significant proximal stenosis, aneurysm, or branch vessel occlusion. Electronically Signed   By: Marin Roberts M.D.   On: 06/25/2016 13:29   Dg Swallowing Func-speech Pathology  Result Date: 06/25/2016 Objective Swallowing Evaluation: Type of Study: MBS-Modified Barium Swallow Study Patient Details Name: Tammy Hines MRN: [id number] Date of Birth: 01/08/1961 Today's Date: 06/25/2016 Time: SLP Start Time (ACUTE ONLY): 1200-SLP Stop Time (ACUTE ONLY): 1215 SLP Time Calculation (min) (ACUTE ONLY): 15 min Past Medical History: Past Medical History: Diagnosis Date . Anxiety  . Arthritis   "right knee, left hip, going down my neck into my right shoulder" (06/24/2016) . Asthma  . Bipolar 1 disorder (HCC)  . Chronic lower back pain  . Chronic neck pain  . COPD (chronic obstructive pulmonary disease) (HCC)  . Depression   [name] 06/24/2016 . Family history of adverse reaction to anesthesia   "makes my mom throw up"  . Fibromyalgia  . GERD (gastroesophageal reflux disease)   [name] 06/24/2016 . Hepatitis B  . History of hiatal hernia  . History of stomach ulcers  . History of transient ischemic attack (TIA)   /RN 06/24/2016 . Hyperlipidemia  . Hypertension  . Ischemic stroke (HCC)   acute/notes 06/24/2016; "left sided weakness"/RN (06/24/2016 . Migraine   "a few/year now" (06/24/2016) .  Obesity  . PONV (postoperative nausea and vomiting)  Past Surgical History: Past Surgical History: Procedure Laterality Date . ANAL FISSURE REPAIR   . FRACTURE SURGERY   . MULTIPLE TOOTH EXTRACTIONS  2018  "I had 6 teeth pulled" . SHOULDER ARTHROSCOPY W/ ROTATOR CUFF REPAIR Right 2000 . SHOULDER SURGERY Right 5027 HPI: 56 year old female came to ED with dysarthria, right facial asymmetry.  MRI Small patchy areas of acute infarct in the left posterior frontal lobe. Subjective: alert, communicative Assessment / Plan / Recommendation CHL IP CLINICAL IMPRESSIONS 06/25/2016 Clinical Impression Pt presents with functional oropharyngeal swallow with minimal motor component - there is functional mastication of all materials; mild deficits in base-of-tongue action, leading to mild vallecular residue.  When consuming a 13 mm pill in conjunction with thin liquid, trace amounts of thin barium reached the level of the vocal cords.  There was no aspiration throughout study, nor other incidents of penetration.  Esophageal screen revealed clearance of  barium through LES.  Recommend advancing diet to regular solids, thin liquids; meds whole in puree.   Pt viewed video in real time and we discussed results.  No F/u warranted.  SLP Visit Diagnosis Dysphagia, unspecified (R13.10) Attention and concentration deficit following -- Frontal lobe and executive function deficit following -- Impact on safety and function --   CHL IP TREATMENT RECOMMENDATION 06/25/2016 Treatment Recommendations No treatment recommended at this time   No flowsheet data found. CHL IP DIET RECOMMENDATION 06/25/2016 SLP Diet Recommendations Regular solids;Thin liquid Liquid Administration via Cup;Straw Medication Administration Whole meds with puree Compensations -- Postural Changes --   CHL IP OTHER RECOMMENDATIONS 06/25/2016 Recommended Consults -- Oral Care Recommendations Oral care BID Other Recommendations --   CHL IP FOLLOW UP RECOMMENDATIONS 06/25/2016 Follow up  Recommendations None   CHL IP FREQUENCY AND DURATION 06/24/2016 Speech Therapy Frequency (ACUTE ONLY) min 2x/week Treatment Duration 1 week      CHL IP ORAL PHASE 06/25/2016 Oral Phase WFL Oral - Pudding Teaspoon -- Oral - Pudding Cup -- Oral - Honey Teaspoon -- Oral - Honey Cup -- Oral - Nectar Teaspoon -- Oral - Nectar Cup -- Oral - Nectar Straw -- Oral - Thin Teaspoon -- Oral - Thin Cup -- Oral - Thin Straw -- Oral - Puree -- Oral - Mech Soft -- Oral - Regular -- Oral - Multi-Consistency -- Oral - Pill -- Oral Phase - Comment --  CHL IP PHARYNGEAL PHASE 06/25/2016 Pharyngeal Phase Impaired Pharyngeal- Pudding Teaspoon -- Pharyngeal -- Pharyngeal- Pudding Cup -- Pharyngeal -- Pharyngeal- Honey Teaspoon -- Pharyngeal -- Pharyngeal- Honey Cup -- Pharyngeal -- Pharyngeal- Nectar Teaspoon -- Pharyngeal -- Pharyngeal- Nectar Cup -- Pharyngeal -- Pharyngeal- Nectar Straw -- Pharyngeal -- Pharyngeal- Thin Teaspoon -- Pharyngeal -- Pharyngeal- Thin Cup Reduced airway/laryngeal closure;Penetration/Aspiration during swallow Pharyngeal Material enters airway, CONTACTS cords and then ejected out Pharyngeal- Thin Straw -- Pharyngeal -- Pharyngeal- Puree Delayed swallow initiation-vallecula;Reduced tongue base retraction;Pharyngeal residue - valleculae Pharyngeal -- Pharyngeal- Mechanical Soft Pharyngeal residue - valleculae;Reduced tongue base retraction;Delayed swallow initiation-vallecula Pharyngeal -- Pharyngeal- Regular -- Pharyngeal -- Pharyngeal- Multi-consistency -- Pharyngeal -- Pharyngeal- Pill -- Pharyngeal -- Pharyngeal Comment --  CHL IP CERVICAL ESOPHAGEAL PHASE 06/25/2016 Cervical Esophageal Phase WFL Pudding Teaspoon -- Pudding Cup -- Honey Teaspoon -- Honey Cup -- Nectar Teaspoon -- Nectar Cup -- Nectar Straw -- Thin Teaspoon -- Thin Cup -- Thin Straw -- Puree -- Mechanical Soft -- Regular -- Multi-consistency -- Pill -- Cervical Esophageal Comment -- CHL IP GO 06/25/2016 Functional Assessment Tool Used clinical  judgment Functional Limitations Swallowing Swallow Current Status (A5409) CH Swallow Goal Status (W1191) [facility] Swallow Discharge Status (Y7829) CH Motor Speech Current Status (F6213) (None) Motor Speech Goal Status (Y8657) (None) Motor Speech Goal Status (Q4696) (None) Spoken Language Comprehension Current Status (E9528) (None) Spoken Language Comprehension Goal Status (U1324) (None) Spoken Language Comprehension Discharge Status (M0102) (None) Spoken Language Expression Current Status (V2536) (None) Spoken Language Expression Goal Status (U4403) (None) Spoken Language Expression Discharge Status [phone] (None) Attention Current Status (Z5638) (None) Attention Goal Status (V5643) (None) Attention Discharge Status (P2951) (None) Memory Current Status (O8416) (None) Memory Goal Status (S0630) (None) Memory Discharge Status (Z6010) (None) Voice Current Status (X3235) (None) Voice Goal Status (T7322) (None) Voice Discharge Status (G2542) (None) Other Speech-Language Pathology Functional Limitation Current Status (H0623) (None) Other Speech-Language Pathology Functional Limitation Goal Status (J6283) (None) Other Speech-Language Pathology Functional Limitation Discharge Status [phone]) (None) Blenda Mounts Laurice 06/25/2016, 12:29 PM  Scheduled Meds: . [MAR Hold] alprazolam  2 mg Oral TID  . [MAR Hold] asenapine  20 mg Sublingual QHS  . [MAR Hold] aspirin EC  325 mg Oral Daily  . [MAR Hold] clomiPRAMINE  50 mg Oral QHS  . [MAR Hold] dicyclomine  10 mg Oral TID AC  . [MAR Hold] fesoterodine  8 mg Oral QPM  . [MAR Hold] FLUoxetine  40 mg Oral TID  . [MAR Hold] gabapentin  400 mg Oral QID  . [MAR Hold] heparin  5,000 Units Subcutaneous Q8H  . [MAR Hold] pantoprazole  40 mg Oral Daily  . [MAR Hold] protein supplement shake  11 oz Oral Q24H  . [MAR Hold] simvastatin  20 mg Oral QHS   Continuous Infusions:   LOS: 1 day     Kuzey Ogata, MD, FACP, FHM. [facility] Pager  [phone]  If 7PM-7AM, please contact night-coverage [email or web address] Password [email or web address] 06/26/2016, 4:50 PM

## 2016-06-26 NOTE — Progress Notes (Signed)
STROKE TEAM PROGRESS NOTE   SUBJECTIVE (INTERVAL HISTORY) Her IV team RN is at the bedside attempting to place IV for CTA. Pt with no new complaints.TEE showed a small PFO but no clot or any other cardiac source of embolism   OBJECTIVE Temp:  [97.5 F (36.4 C)-98.5 F (36.9 C)] 98 F (36.7 C) (03/22 1300) Pulse Rate:  [86-106] 100 (03/22 1300) Cardiac Rhythm: Normal sinus rhythm (03/22 0800) Resp:  [14-20] 20 (03/22 1300) BP: (122-176)/(73-118) 127/83 (03/22 1300) SpO2:  [92 %-98 %] 96 % (03/22 1300) Weight:  [295 lb 6.4 oz (134 kg)] 295 lb 6.4 oz (134 kg) (03/22 0400)  CBC:   Recent Labs Lab 06/24/16 0406 06/24/16 0421 06/25/16 0358  WBC 8.8  --  10.5  NEUTROABS 5.3  --   --   HGB 12.4 12.6 12.6  HCT 39.2 37.0 40.2  MCV 87.3  --  88.0  PLT 228  --  268    Basic Metabolic Panel:   Recent Labs Lab 06/25/16 0358 06/26/16 0404  NA 139 143  K 4.2 4.2  CL 106 108  CO2 24 27  GLUCOSE 101* 96  BUN 11 12  CREATININE 0.73 0.80  CALCIUM 9.4 9.2   HgbA1c:  Lab Results  Component Value Date   HGBA1C 6.2 (H) 06/25/2016   PHYSICAL EXAM Pleasant middle aged lady not in distress. . Afebrile. Head is nontraumatic. Neck is supple without bruit.    Cardiac exam no murmur or gallop. Lungs are clear to auscultation. Distal pulses are well felt. Neurological Exam :  Awake alert oriented x 3 normal   Language but mild dysarthria. Mild right lower face asymmetry. Tongue midline. No drift. Mild diminished fine finger movements on right. Orbits left over right upper extremity. Mild right grip weak.. Normal sensation . Normal coordination.   ASSESSMENT/PLAN Tammy Hines is a 56 y.o. female with history of severe anxiety, depression, borderline personality / bipolar disorder 1 , HTN, HLD, GERD, asthma  presenting with R arm weakness, R facial droop, slurred speech. She did not receive IV t-PA due to being out of the window, completed stroke on MRI.   Stroke:   L posterior  frontal cortical and subcortical infarct embolic secondary to unknown source, workup underway  CT head no acute abnormality  MRI  L posterior frontal cortical and white matter infarct  CTA head  pending   Carotid Doppler  No significant ICA stenosis. B thyroid heterogenous areas, consider [place]  2D Echo  pending   TEE to look for embolic source. Arranged with [facility] for tomorrow.  If positive for PFO (patent foramen ovale), check bilateral lower extremity venous dopplers to rule out DVT as possible source of stroke. (I have made patient NPO after midnight tonight).   If TEE negative, a [facility] electrophysiologist will consult and consider placement of an implantable loop recorder to evaluate for atrial fibrillation as etiology of stroke. This has been explained to patient/family by Dr. Pearlean Brownie and they are agreeable.   LDL 98  HgbA1c pending  Heparin 5000 units sq tid for VTE prophylaxis Diet Heart Room service appropriate? Yes; Fluid consistency: Thin No antithrombotic prior to admission, now on No antithrombotic. Added aspirin 325 mg daily for secondary stroke prevention  Therapy recommendations:  pending   Disposition:  pending   Hypertension  Stable Permissive hypertension (OK if < 220/120) but gradually normalize in 5-7 days Long-term BP goal normotensive  Hyperlipidemia  Home meds:  zocor 20, resumed in hospital  LDL 98 goal  Consider increase statin dose  Continue statin at discharge  Other Stroke Risk Factors  Cigarette smoker, advised to stop smoking  UDS positive for benzos  Morbid Obesity, Body mass index is 47.68 kg/m.  Other Active Problems  Anxiety  Bipolar disorder  GERD  Hospital day # 1  SETHI,PRAMOD  [facility] See Amion for Pager information 06/26/2016 2:10 PM  I have personally examined this patient, reviewed notes, independently viewed imaging studies, participated  in medical decision making and plan of care.ROS completed by me personally and pertinent positives fully documented  I have made any additions or clarifications directly to the above note. Agree with note above. She presented with embolic left brain infarct. Continue aspirin for stroke prevention and check lower extremity venous Dopplers followed by loop recorder insertion prior to discharge. Discussed with patient and husband and answered questions Greater than 50% time during this 25 minute visit was spent on counseling and coordination of care about her stroke evaluation, treatment and answering questions  Delia HeadyPramod Sethi, MD Medical Director [facility] Pager: [phone] 06/26/2016 2:10 PM  To contact Stroke Continuity provider, please refer to [email or web address]. After hours, contact General Neurology

## 2016-06-26 NOTE — Progress Notes (Signed)
*  PRELIMINARY RESULTS* Vascular Ultrasound Bilateral lower extremity venous duplex has been completed.  Preliminary findings: No evidence of deep vein thrombosis or baker's cyst bilaterally.   Tammy FischerCharlotte C Danasha Melman 06/26/2016, 4:12 PM

## 2016-06-26 NOTE — H&P (View-Only) (Signed)
ELECTROPHYSIOLOGY CONSULT NOTE  Patient ID: Tammy Hines MRN: [id number], DOB/AGE: 1961/02/29   Admit date: 06/24/2016 Date of Consult: 06/26/2016  Primary Physician: Quitman Livings, MD Primary Cardiologist: Dr. Antoine Poche Reason for Consultation: Cryptogenic stroke - recommendations regarding Implantable Loop Recorder Requesting MD: Dr. Pearlean Brownie  History of Present Illness Tammy Hines was admitted on 06/24/2016 with acute onset facial droop, slurred speech and R hand weakness while at home .  PMHx noted for HTN, HLD, obesity, bipolar disorder, anxiety, GERD, asthma, smoker, very sedentary life style.  She first developed symptoms while at home, noted when drinking water was dribbling out of her mouth.    Imaging demonstrated  L posterior frontal cortical and subcortical infarct embolic secondary to unknown source, workup underway.  she has undergone workup for stroke including echocardiogram/TEE and carotid dopplers.  The patient has been monitored on telemetry which has demonstrated sinus rhythm with no arrhythmias.  Inpatient stroke work-up is to be completed with a LE doppler given small PFO on TEE.  Echocardiogram this admission demonstrated  Study Conclusions - Left ventricle: The cavity size was normal. Wall thickness was   normal. Systolic function was normal. The estimated ejection   fraction was in the range of 60% to 65%. Wall motion was normal;   there were no regional wall motion abnormalities. Doppler   parameters are consistent with abnormal left ventricular   relaxation (grade 1 diastolic dysfunction). - Aortic valve: There was no stenosis. - Mitral valve: There was no significant regurgitation. - Right ventricle: The cavity size was normal. Systolic function   was normal. - Pulmonary arteries: No complete TR doppler jet so unable to   estimate PA systolic pressure. - Inferior vena cava: The vessel was normal in size. The   respirophasic diameter changes were in the  normal range (>= 50%),   consistent with normal central venous pressure. Impressions: - Normal LV size and systolic function, EF 60-65%. Normal RV size   and systolic function. No significant valvular abnormalities.  TEE today FINDINGS:  There is a small PFO with small bidirectional shunt. Otherwise normal study.  Lab work is reviewed.  Prior to admission, the patient denies chest pain, shortness of breath, dizziness, palpitations, or syncope.  They are recovering from their stroke with plans to home at discharge.  EP has been asked to evaluate for placement of an implantable loop recorder to monitor for atrial fibrillation.   Past Medical History:  Diagnosis Date  . Anxiety   . Arthritis    "right knee, left hip, going down my neck into my right shoulder" (06/24/2016)  . Asthma   . Bipolar 1 disorder (HCC)   . Chronic lower back pain   . Chronic neck pain   . COPD (chronic obstructive pulmonary disease) (HCC)   . Depression    [name] 06/24/2016  . Family history of adverse reaction to anesthesia    "makes my mom throw up"   . Fibromyalgia   . GERD (gastroesophageal reflux disease)    [name] 06/24/2016  . Hepatitis B   . History of hiatal hernia   . History of stomach ulcers   . History of transient ischemic attack (TIA)    /RN 06/24/2016  . Hyperlipidemia   . Hypertension   . Ischemic stroke (HCC)    acute/notes 06/24/2016; "left sided weakness"/RN (06/24/2016  . Migraine    "a few/year now" (06/24/2016)  . Obesity   . PONV (postoperative nausea and vomiting)  Surgical History:  Past Surgical History:  Procedure Laterality Date  . ANAL FISSURE REPAIR    . FRACTURE SURGERY    . MULTIPLE TOOTH EXTRACTIONS  2018   "I had 6 teeth pulled"  . SHOULDER ARTHROSCOPY W/ ROTATOR CUFF REPAIR Right 2000  . SHOULDER SURGERY Right 1999     Prescriptions Prior to Admission  Medication Sig Dispense Refill Last Dose  . albuterol (PROVENTIL HFA;VENTOLIN HFA) 108 (90 Base)  MCG/ACT inhaler Inhale 2 puffs into the lungs every 6 (six) hours as needed for wheezing or shortness of breath.   rescue  . alprazolam (XANAX) 2 MG tablet Take 2 mg by mouth 3 (three) times daily.    06/23/2016 at Unknown time  . Asenapine Maleate (SAPHRIS) 10 MG SUBL Place 20 mg under the tongue at bedtime.   06/22/2016  . atenolol (TENORMIN) 100 MG tablet Take 100 mg by mouth at bedtime.   06/23/2016 at 1700  . clomiPRAMINE (ANAFRANIL) 50 MG capsule Take 50 mg by mouth at bedtime. For OCD   06/23/2016 at Unknown time  . dicyclomine (BENTYL) 10 MG capsule Take 10 mg by mouth 3 (three) times daily before meals.    06/23/2016 at Unknown time  . fesoterodine (TOVIAZ) 8 MG TB24 tablet Take 8 mg by mouth every evening.   06/23/2016 at Unknown time  . FLUoxetine (PROZAC) 40 MG capsule Take 40 mg by mouth 3 (three) times daily.    06/23/2016 at Unknown time  . furosemide (LASIX) 20 MG tablet Take 20 mg by mouth every morning.    06/23/2016 at Unknown time  . gabapentin (NEURONTIN) 400 MG capsule Take 400 mg by mouth 4 (four) times daily.   06/23/2016 at Unknown time  . MYRBETRIQ 25 MG TB24 tablet Take 25 mg by mouth every evening.    06/23/2016 at Unknown time  . naproxen (NAPROSYN) 500 MG tablet Take 500 mg by mouth 2 (two) times daily.   06/23/2016 at Unknown time  . omeprazole (PRILOSEC) 20 MG capsule Take 40 mg by mouth daily.    06/23/2016 at Unknown time  . oxyCODONE-acetaminophen (PERCOCET) 10-325 MG tablet Take 1 tablet by mouth 3 (three) times daily. Scheduled 7a, 12p, 5p   06/23/2016 at Unknown time  . simvastatin (ZOCOR) 20 MG tablet Take 20 mg by mouth at bedtime.    06/22/2016    Inpatient Medications:  . alprazolam  2 mg Oral TID  . asenapine  20 mg Sublingual QHS  . aspirin EC  325 mg Oral Daily  . clomiPRAMINE  50 mg Oral QHS  . dicyclomine  10 mg Oral TID AC  . fesoterodine  8 mg Oral QPM  . FLUoxetine  40 mg Oral TID  . gabapentin  400 mg Oral QID  . heparin  5,000 Units Subcutaneous Q8H  .  pantoprazole  40 mg Oral Daily  . protein supplement shake  11 oz Oral Q24H  . simvastatin  20 mg Oral QHS    Allergies:  Allergies  Allergen Reactions  . Ampicillin Hives and Nausea And Vomiting    Has patient had a PCN reaction causing immediate rash, facial/tongue/throat swelling, SOB or lightheadedness with hypotension: No Has patient had a PCN reaction causing severe rash involving mucus membranes or skin necrosis: No Has patient had a PCN reaction that required hospitalization No Has patient had a PCN reaction occurring within the last 10 years: No If all of the above answers are "NO", then may proceed with Cephalosporin use.   Marland Kitchen  Cleocin [Clindamycin Hcl]     "deathly sick"  . Clonazepam Other (See Comments)    Pt does not remember what the reaction was  . Other Other (See Comments)    Allergy to mold, down and feathers per allergy test  . Penicillins Other (See Comments)    Has patient had a PCN reaction causing immediate rash, facial/tongue/throat swelling, SOB or lightheadedness with hypotension: No Has patient had a PCN reaction causing severe rash involving mucus membranes or skin necrosis: No Has patient had a PCN reaction that required hospitalization No Has patient had a PCN reaction occurring within the last 10 years: No If all of the above answers are "NO", then may proceed with Cephalosporin use.   Pt told not to take because of reaction to ampicillin - no  . Bupropion Hcl Itching and Anxiety    Social History   Social History  . Marital status: Single    Spouse name: N/A  . Number of children: N/A  . Years of education: N/A   Occupational History  . Not on file.   Social History Main Topics  . Smoking status: Current Every Day Smoker    Packs/day: 0.33    Years: 25.00    Types: Cigarettes  . Smokeless tobacco: Never Used  . Alcohol use 0.0 oz/week     Comment: 06/24/2016 "I'll have a drink once/year"  . Drug use: No  . Sexual activity: No    Other Topics Concern  . Not on file   Social History Narrative   Lives alone.       Family History  Problem Relation Age of Onset  . Hypertension Mother   . Cancer Father     esophageal  . Heart attack Maternal Grandfather       Review of Systems: All other systems reviewed and are otherwise negative except as noted above.  Physical Exam: Vitals:   06/26/16 1146 06/26/16 1150 06/26/16 1200 06/26/16 1219  BP: (!) 137/91 140/81 137/81 140/85  Pulse: 99  95 91  Resp: 16  15 14   Temp: 98.3 F (36.8 C)     TempSrc: Oral     SpO2: 97%  92% 92%  Weight:      Height:        GEN- The patient is well appearing, alert and oriented x 3 today.   Head- normocephalic, atraumatic Eyes-  Sclera clear, conjunctiva pink Ears- hearing intact Oropharynx- clear Neck- supple Lungs- CTA b/l, a slight end exp wheeze b/l, normal work of breathing Heart- RRR, no murmurs, rubs or gallops  GI- soft, NT, ND Extremities- no clubbing, cyanosis, or edema MS- no significant deformity or atrophy Skin- no rash or lesion Psych- euthymic mood, full affect   Labs:   Lab Results  Component Value Date   WBC 10.5 06/25/2016   HGB 12.6 06/25/2016   HCT 40.2 06/25/2016   MCV 88.0 06/25/2016   PLT 268 06/25/2016    Recent Labs Lab 06/24/16 0406  06/26/16 0404  NA 136  < > 143  K 3.8  < > 4.2  CL 100*  < > 108  CO2 27  < > 27  BUN 18  < > 12  CREATININE 0.86  < > 0.80  CALCIUM 9.0  < > 9.2  PROT 6.2*  --   --   BILITOT 0.5  --   --   ALKPHOS 73  --   --   ALT 25  --   --  AST 23  --   --   GLUCOSE 109*  < > 96  < > = values in this interval not displayed. No results found for: CKTOTAL, CKMB, CKMBINDEX, TROPONINI Lab Results  Component Value Date   CHOL 177 06/25/2016   Lab Results  Component Value Date   HDL 38 (L) 06/25/2016   Lab Results  Component Value Date   LDLCALC 98 06/25/2016   Lab Results  Component Value Date   TRIG 207 (H) 06/25/2016   Lab Results   Component Value Date   CHOLHDL 4.7 06/25/2016   No results found for: LDLDIRECT  Lab Results  Component Value Date   DDIMER 0.47 08/30/2015     Radiology/Studies:  Ct Angio Head W Or Wo Contrast Result Date: 06/25/2016 CLINICAL DATA:  Posterior left frontal lobe infarct. Right facial droop and right hand weakness. EXAM: CT ANGIOGRAPHY HEAD TECHNIQUE: Multidetector CT imaging of the head was performed using the standard protocol during bolus administration of intravenous contrast. Multiplanar CT image reconstructions and MIPs were obtained to evaluate the vascular anatomy. CONTRAST:  150 mL Isovue 370. The initial bolus was suboptimal. Therefore two 75 mL doses were used. COMPARISON:  MRI brain 06/24/2016 FINDINGS: CT HEAD Brain: A left frontal lobe infarct is stable. No acute hemorrhage or mass lesion is present. The insular ribbon is intact. The basal ganglia are intact. There is no significant extension of the infarct. No significant mass effect is present. Ventricles are of normal size. The brainstem and cerebellum are unremarkable. Vascular: No hyperdense vessel or unexpected calcification. Skull: The calvarium is normal. No focal lytic or blastic lesions are present. Sinuses: The paranasal sinuses and mastoid air cells are clear. Orbits: The globes and orbits are unremarkable. CTA HEAD Anterior circulation: The internal carotid arteries are within normal limits from the high cervical segments through the ICA termini bilaterally. The A1 and M1 segments are normal. The anterior communicating artery is patent. MCA bifurcations are within normal limits. The ACA and MCA branch vessels are normal. No significant proximal stenosis or occlusion is present to explain the left MCA territory infarct. Posterior circulation: The vertebral arteries are codominant. The PICA origins are visualized and normal bilaterally. Basilar artery is normal. The right posterior cerebral artery is of fetal type. A right  posterior communicating artery is present. The left posterior cerebral artery is of fetal type. The PCA branch vessels are within normal limits. Venous sinuses: The dural sinuses are patent. The left transverse sinus is dominant. The straight sinus and deep cerebral veins are intact. Anatomic variants: Fetal type left posterior cerebral artery. Delayed phase: The postcontrast images demonstrate no pathologic enhancement. The infarct is better visualized on the postcontrast images. IMPRESSION: 1. Left frontal lobe infarct. 2. Normal variant CTA circle of Willis. No significant proximal stenosis, aneurysm, or branch vessel occlusion. Electronically Signed   By: Marin Robertshristopher  Mattern M.D.   On: 06/25/2016 13:29   Ct Head Wo Contrast Result Date: 06/24/2016 CLINICAL DATA:  56 year old female with right facial droop. EXAM: CT HEAD WITHOUT CONTRAST TECHNIQUE: Contiguous axial images were obtained from the base of the skull through the vertex without intravenous contrast. COMPARISON:  None. FINDINGS: Brain: No evidence of acute infarction, hemorrhage, hydrocephalus, extra-axial collection or mass lesion/mass effect. Vascular: No hyperdense vessel or unexpected calcification. Skull: Normal. Negative for fracture or focal lesion. Sinuses/Orbits: No acute finding. Other: None IMPRESSION: No acute intracranial pathology. Electronically Signed   By: Elgie CollardArash  Radparvar M.D.   On: 06/24/2016 04:07  Mr Brain Wo Contrast Result Date: 06/24/2016 CLINICAL DATA:  Right facial droop.  Right hand weakness.  Stroke. EXAM: MRI HEAD WITHOUT CONTRAST TECHNIQUE: Multiplanar, multiecho pulse sequences of the brain and surrounding structures were obtained without intravenous contrast. COMPARISON:  CT head 06/24/2016 FINDINGS: Brain: Acute infarct in the left posterior frontal cortex and white matter. Patchy areas of restricted diffusion are present in this area. No other areas of acute infarct. No significant chronic ischemia. Negative for  hemorrhage or mass. Ventricle size normal. No shift of the midline structures. Vascular: Normal arterial flow void. Skull and upper cervical spine: Negative Sinuses/Orbits: Negative Other: None IMPRESSION: Small patchy areas of acute infarct in the left posterior frontal cortex and white matter. Negative for hemorrhage. Electronically Signed   By: Marlan Palau M.D.   On: 06/24/2016 08:10   Dg Chest Port 1 View Result Date: 06/24/2016 CLINICAL DATA:  Wheezing starting this morning EXAM: PORTABLE CHEST 1 VIEW COMPARISON:  08/30/2015 FINDINGS: Cardiomediastinal silhouette is stable. No infiltrate or pleural effusion. No pulmonary edema. Bony thorax is unremarkable. IMPRESSION: No active disease. Electronically Signed   By: Natasha Mead M.D.   On: 06/24/2016 12:01               12-lead ECG SR All prior EKG's in EPIC reviewed with no documented atrial fibrillation Telemetry SR/ST, lead loss/artifact intermittently  Assessment and Plan:   1. Cryptogenic stroke The patient presents with cryptogenic stroke.  The patient has a TEE planned for this AM.  I spoke at length with the patient about monitoring for afib with either a 30 day event monitor or an implantable loop recorder.  Risks, benefits, and alteratives to implantable loop recorder were discussed with the patient today.   At this time, the patient is very clear in their decision to proceed with implantable loop recorder.    Wound care was reviewed with the patient (keep incision clean and dry for 3 days).  Wound check is scheduled for the patient.   Please call with questions.   Renee Norberto Sorenson, PA-C 06/26/2016   I have seen, examined the patient, and reviewed the above assessment and plan.  On exam, RRR.  Changes to above are made where necessary.  Pt with small PFO on TEE but no DVT on dopplers.  Given concerns for embolic stroke, I agree with Dr Pearlean Brownie that ILR is indicated.  Risks of ILR discussed with the patient who wishes to  proceed.  Co Sign: Hillis Range, MD 06/26/2016 4:07 PM

## 2016-06-26 NOTE — Progress Notes (Signed)
OT Cancellation Note  Patient Details Name: Tammy JarvisGretchen K Hines MRN: [id number] DOB: 1960/12/22   Cancelled Treatment:    Reason Eval/Treat Not Completed: Patient at procedure or test/ unavailable.  Mardi Cannady Trappeonarpe, OTR/L [phone]   Jeani HawkingConarpe, Igor Bishop M 06/26/2016, 4:21 PM

## 2016-06-27 ENCOUNTER — Encounter (HOSPITAL_COMMUNITY): Payer: Self-pay | Admitting: Internal Medicine

## 2016-06-27 MED ORDER — SIMVASTATIN 20 MG PO TABS: 40.0000 mg | ORAL_TABLET | Freq: Every day | ORAL | 0 refills | Status: DC

## 2016-06-27 MED ORDER — NAPROXEN 500 MG PO TABS: 500.0000 mg | ORAL_TABLET | Freq: Two times a day (BID) | ORAL | Status: DC | PRN

## 2016-06-27 MED ORDER — ATENOLOL 100 MG PO TABS: 100.0000 mg | ORAL_TABLET | Freq: Every day | ORAL | Status: DC

## 2016-06-27 MED ORDER — ASPIRIN 325 MG PO TBEC: 325.0000 mg | DELAYED_RELEASE_TABLET | Freq: Every day | ORAL | 0 refills | Status: DC

## 2016-06-27 NOTE — Discharge Summary (Signed)
Physician Discharge Summary  Tammy Hines ZOX:[id number] DOB: 05-Dec-1960  PCP: Quitman Livings, MD  Admit date: 06/24/2016 Discharge date: 06/27/2016  Recommendations for Outpatient Follow-up:  1. Dr. Quitman Livings, PCP in 1 week. 2. On Carotid Doppler study, heterogeneous area is noted in bilateral thyroid and may consider outpatient thyroid ultrasound per PCP 3. Dr. Delia Heady, Neurology: I have sent electronic request for office to call patient with a follow-up appointment in 8 weeks. 4. [facility] on 07/07/16 at 10:30 AM for ILR site wound check.  Home Health: PT & OT Equipment/Devices: Patient declined 3 n 1    Discharge Condition: Improved and stable  CODE STATUS: Full  Diet recommendation: Heart healthy diet.  Discharge Diagnoses:  Principal Problem:   Acute ischemic stroke [facility]) Active Problems:   Depression   Asthma   GERD   DISC DISEASE, LUMBAR   Bipolar disorder (HCC)   Hypertension   Hyperlipidemia   Anxiety   Stroke (cerebrum) [facility])   Brief Summary: 56 year old female with PMH of severe anxiety, depression, borderline personality/bipolar disorder, HTN, HLD, GERD, asthma, morbid obesity, presented to ED with sudden onset of right-sided facial droop, right hand weakness and slurred speech. In the ED, CT head did not reveal any acute pathology but MRI brain showed acute stroke. Neurology consulted.   Assessment & Plan:   Acute stroke: Left posterior frontal cortical and subcortical infarct embolic secondary to unknown source. CT head without acute abnormality MRI head: Left posterior frontal cortical and white matter infarct. CTA head: Left frontal lobe infarct. No significant abnormalities. Carotid Doppler: No significant ICA stenosis. Heterogeneous areas noted in bilateral thyroid, consider thyroid ultrasound if clinically necessary-can be pursued as outpatient. TSH: 0.922. 2-D echo: LVEF 60-65 percent. Grade 1 diastolic dysfunction. TEE: Small PFO with  small bidirectional shunt. Otherwise normal study. LDL 98 A1c: 6.2 Diet: Advanced to regular diet by speech therapy. On 3/23, patient complained of some issues with difficulty swallowing. Speech therapy reevaluated and reiterated regular diet. Not on antithrombotic spread to admission. Aspirin 325 MG daily initiated for secondary stroke prevention. Therapies recommend home health PT, OT and 3 and 1. Patient declined DME. For cryptogenic stroke, following negative TEE, cardiology was consulted and placed ILR on 06/26/16 and have arranged outpatient follow-up. Bilateral lower extremity venous Dopplers: Negative for DVT.  Essential hypertension Permissive hypertension but gradually normalizing 5-7 days.  Hyperlipidemia LDL 98, goal <70. Increased simvastatin to 40 mg at bedtime.  Tobacco abuse Cessation counseled.  Morbid obesity/Body mass index is 47.68 kg/m.  Anxiety/bipolar disorder Stable. Continue prior home medications.  GERD  Chronic pain As per patient's report to RN, she takes scheduled Percocet-continue per home regimen.   Consultants:  Neurology  Cardiology  Procedures:  Carotid Doppler: Summary:  - No significant extracranial carotid artery stenosis demonstrated. Vertebrals are patent with antegrade flow. - Bilateral thyroid: heterogenous areas noted.Correlate with thyroid ultrasound if clinically necessary.  2-D echo 06/25/16: Study Conclusions  - Left ventricle: The cavity size was normal. Wall thickness was normal. Systolic function was normal. The estimated ejection fraction was in the range of 60% to 65%. Wall motion was normal; there were no regional wall motion abnormalities. Doppler parameters are consistent with abnormal left ventricular relaxation (grade 1 diastolic dysfunction). - Aortic valve: There was no stenosis. - Mitral valve: There was no significant regurgitation. - Right ventricle: The cavity size was normal.  Systolic function was normal. - Pulmonary arteries: No complete TR doppler jet so unable to estimate PA systolic pressure. -  Inferior vena cava: The vessel was normal in size. The respirophasic diameter changes were in the normal range (>= 50%), consistent with normal central venous pressure.  Impressions:  - Normal LV size and systolic function, EF 60-65%. Normal RV size and systolic function. No significant valvular abnormalities.  TEE 06/26/16: Study Conclusions  - Left ventricle: The cavity size was normal. Wall thickness was   normal. Systolic function was normal. The estimated ejection   fraction was in the range of 60% to 65%. Wall motion was normal;   there were no regional wall motion abnormalities. - Left atrium: No evidence of thrombus in the atrial cavity or   appendage. No spontaneous echo contrast was observed. - Right atrium: No evidence of thrombus in the atrial cavity or   appendage. - Atrial septum: There was a small patent foramen ovale. Doppler   and agitated saline contrast study showed a very small   bidirectional atrial level shunt, in the baseline state  Bilateral lower extremity venous Dopplers 06/26/16: Summary:  - No evidence of deep vein thrombosis involving the visualized   veins of the right lower extremity. - No evidence of deep vein thrombosis involving the visualized   veins of the left lower extremity. - No evidence of Baker&'s cyst on the right or left.    Discharge Instructions  Discharge Instructions    Ambulatory referral to Neurology    Complete by:  As directed    An appointment is requested in approximately: 8 weeks   Call MD for:    Complete by:  As directed    Strokelike symptoms.   Diet - low sodium heart healthy    Complete by:  As directed    Increase activity slowly    Complete by:  As directed        Medication List    TAKE these medications   albuterol 108 (90 Base) MCG/ACT inhaler Commonly known  as:  PROVENTIL HFA;VENTOLIN HFA Inhale 2 puffs into the lungs every 6 (six) hours as needed for wheezing or shortness of breath.   alprazolam 2 MG tablet Commonly known as:  XANAX Take 2 mg by mouth 3 (three) times daily.   aspirin 325 MG EC tablet Take 1 tablet (325 mg total) by mouth daily. Start taking on:  06/28/2016   atenolol 100 MG tablet Commonly known as:  TENORMIN Take 1 tablet (100 mg total) by mouth at bedtime. Start taking on:  06/30/2016   clomiPRAMINE 50 MG capsule Commonly known as:  ANAFRANIL Take 50 mg by mouth at bedtime. For OCD   dicyclomine 10 MG capsule Commonly known as:  BENTYL Take 10 mg by mouth 3 (three) times daily before meals.   FLUoxetine 40 MG capsule Commonly known as:  PROZAC Take 40 mg by mouth 3 (three) times daily.   furosemide 20 MG tablet Commonly known as:  LASIX Take 20 mg by mouth every morning.   gabapentin 400 MG capsule Commonly known as:  NEURONTIN Take 400 mg by mouth 4 (four) times daily.   MYRBETRIQ 25 MG Tb24 tablet Generic drug:  mirabegron ER Take 25 mg by mouth every evening.   naproxen 500 MG tablet Commonly known as:  NAPROSYN Take 1 tablet (500 mg total) by mouth 2 (two) times daily as needed for mild pain or moderate pain. What changed:  when to take this  reasons to take this   omeprazole 20 MG capsule Commonly known as:  PRILOSEC Take 40 mg by mouth daily.  oxyCODONE-acetaminophen 10-325 MG tablet Commonly known as:  PERCOCET Take 1 tablet by mouth 3 (three) times daily. Scheduled 7a, 12p, 5p   SAPHRIS 10 MG Subl Generic drug:  Asenapine Maleate Place 20 mg under the tongue at bedtime.   simvastatin 20 MG tablet Commonly known as:  ZOCOR Take 2 tablets (40 mg total) by mouth at bedtime. What changed:  how much to take   TOVIAZ 8 MG Tb24 tablet Generic drug:  fesoterodine Take 8 mg by mouth every evening.      Follow-up Information    [facility] [address] Office Follow up on  07/07/2016.   Specialty:  Cardiology Why:  10:30AM Contact information: [address], [address] [place] [address] [phone]       Quitman Livings, MD. Schedule an appointment as soon as possible for a visit in 1 week(s).   Specialty:  Internal Medicine Contact information: [address] [place] [address] [phone]          Allergies  Allergen Reactions  . Ampicillin Hives and Nausea And Vomiting    Has patient had a PCN reaction causing immediate rash, facial/tongue/throat swelling, SOB or lightheadedness with hypotension: No Has patient had a PCN reaction causing severe rash involving mucus membranes or skin necrosis: No Has patient had a PCN reaction that required hospitalization No Has patient had a PCN reaction occurring within the last 10 years: No If all of the above answers are "NO", then may proceed with Cephalosporin use.   Marland Kitchen Cleocin [Clindamycin Hcl]     "deathly sick"  . Clonazepam Other (See Comments)    Pt does not remember what the reaction was  . Other Other (See Comments)    Allergy to mold, down and feathers per allergy test  . Penicillins Other (See Comments)    Has patient had a PCN reaction causing immediate rash, facial/tongue/throat swelling, SOB or lightheadedness with hypotension: No Has patient had a PCN reaction causing severe rash involving mucus membranes or skin necrosis: No Has patient had a PCN reaction that required hospitalization No Has patient had a PCN reaction occurring within the last 10 years: No If all of the above answers are "NO", then may proceed with Cephalosporin use.   Pt told not to take because of reaction to ampicillin - no  . Bupropion Hcl Itching and Anxiety      Procedures/Studies: Ct Angio Head W Or Wo Contrast  Result Date: 06/25/2016 CLINICAL DATA:  Posterior left frontal lobe infarct. Right facial droop and right hand weakness. EXAM: CT ANGIOGRAPHY HEAD TECHNIQUE:  Multidetector CT imaging of the head was performed using the standard protocol during bolus administration of intravenous contrast. Multiplanar CT image reconstructions and MIPs were obtained to evaluate the vascular anatomy. CONTRAST:  150 mL Isovue 370. The initial bolus was suboptimal. Therefore two 75 mL doses were used. COMPARISON:  MRI brain 06/24/2016 FINDINGS: CT HEAD Brain: A left frontal lobe infarct is stable. No acute hemorrhage or mass lesion is present. The insular ribbon is intact. The basal ganglia are intact. There is no significant extension of the infarct. No significant mass effect is present. Ventricles are of normal size. The brainstem and cerebellum are unremarkable. Vascular: No hyperdense vessel or unexpected calcification. Skull: The calvarium is normal. No focal lytic or blastic lesions are present. Sinuses: The paranasal sinuses and mastoid air cells are clear. Orbits: The globes and orbits are unremarkable. CTA HEAD Anterior circulation: The internal carotid arteries are within  normal limits from the high cervical segments through the ICA termini bilaterally. The A1 and M1 segments are normal. The anterior communicating artery is patent. MCA bifurcations are within normal limits. The ACA and MCA branch vessels are normal. No significant proximal stenosis or occlusion is present to explain the left MCA territory infarct. Posterior circulation: The vertebral arteries are codominant. The PICA origins are visualized and normal bilaterally. Basilar artery is normal. The right posterior cerebral artery is of fetal type. A right posterior communicating artery is present. The left posterior cerebral artery is of fetal type. The PCA branch vessels are within normal limits. Venous sinuses: The dural sinuses are patent. The left transverse sinus is dominant. The straight sinus and deep cerebral veins are intact. Anatomic variants: Fetal type left posterior cerebral artery. Delayed phase: The  postcontrast images demonstrate no pathologic enhancement. The infarct is better visualized on the postcontrast images. IMPRESSION: 1. Left frontal lobe infarct. 2. Normal variant CTA circle of Willis. No significant proximal stenosis, aneurysm, or branch vessel occlusion. Electronically Signed   By: Marin Roberts M.D.   On: 06/25/2016 13:29   Ct Head Wo Contrast  Result Date: 06/24/2016 CLINICAL DATA:  56 year old female with right facial droop. EXAM: CT HEAD WITHOUT CONTRAST TECHNIQUE: Contiguous axial images were obtained from the base of the skull through the vertex without intravenous contrast. COMPARISON:  None. FINDINGS: Brain: No evidence of acute infarction, hemorrhage, hydrocephalus, extra-axial collection or mass lesion/mass effect. Vascular: No hyperdense vessel or unexpected calcification. Skull: Normal. Negative for fracture or focal lesion. Sinuses/Orbits: No acute finding. Other: None IMPRESSION: No acute intracranial pathology. Electronically Signed   By: Elgie Collard M.D.   On: 06/24/2016 04:07   Mr Brain Wo Contrast  Result Date: 06/24/2016 CLINICAL DATA:  Right facial droop.  Right hand weakness.  Stroke. EXAM: MRI HEAD WITHOUT CONTRAST TECHNIQUE: Multiplanar, multiecho pulse sequences of the brain and surrounding structures were obtained without intravenous contrast. COMPARISON:  CT head 06/24/2016 FINDINGS: Brain: Acute infarct in the left posterior frontal cortex and white matter. Patchy areas of restricted diffusion are present in this area. No other areas of acute infarct. No significant chronic ischemia. Negative for hemorrhage or mass. Ventricle size normal. No shift of the midline structures. Vascular: Normal arterial flow void. Skull and upper cervical spine: Negative Sinuses/Orbits: Negative Other: None IMPRESSION: Small patchy areas of acute infarct in the left posterior frontal cortex and white matter. Negative for hemorrhage. Electronically Signed   By: Marlan Palau M.D.   On: 06/24/2016 08:10   Dg Chest Port 1 View  Result Date: 06/24/2016 CLINICAL DATA:  Wheezing starting this morning EXAM: PORTABLE CHEST 1 VIEW COMPARISON:  08/30/2015 FINDINGS: Cardiomediastinal silhouette is stable. No infiltrate or pleural effusion. No pulmonary edema. Bony thorax is unremarkable. IMPRESSION: No active disease. Electronically Signed   By: Natasha Mead M.D.   On: 06/24/2016 12:01   Dg Swallowing Func-speech Pathology  Result Date: 06/25/2016 Objective Swallowing Evaluation: Type of Study: MBS-Modified Barium Swallow Study Patient Details Name: Tammy Hines MRN: [id number] Date of Birth: 01/10/1961 Today's Date: 06/25/2016 Time: SLP Start Time (ACUTE ONLY): 1200-SLP Stop Time (ACUTE ONLY): 1215 SLP Time Calculation (min) (ACUTE ONLY): 15 min Past Medical History: Past Medical History: Diagnosis Date . Anxiety  . Arthritis   "right knee, left hip, going down my neck into my right shoulder" (06/24/2016) . Asthma  . Bipolar 1 disorder (HCC)  . Chronic lower back pain  . Chronic neck pain  .  COPD (chronic obstructive pulmonary disease) (HCC)  . Depression   [name] 06/24/2016 . Family history of adverse reaction to anesthesia   "makes my mom throw up"  . Fibromyalgia  . GERD (gastroesophageal reflux disease)   [name] 06/24/2016 . Hepatitis B  . History of hiatal hernia  . History of stomach ulcers  . History of transient ischemic attack (TIA)   /RN 06/24/2016 . Hyperlipidemia  . Hypertension  . Ischemic stroke (HCC)   acute/notes 06/24/2016; "left sided weakness"/RN (06/24/2016 . Migraine   "a few/year now" (06/24/2016) . Obesity  . PONV (postoperative nausea and vomiting)  Past Surgical History: Past Surgical History: Procedure Laterality Date . ANAL FISSURE REPAIR   . FRACTURE SURGERY   . MULTIPLE TOOTH EXTRACTIONS  2018  "I had 6 teeth pulled" . SHOULDER ARTHROSCOPY W/ ROTATOR CUFF REPAIR Right 2000 . SHOULDER SURGERY Right 3912 HPI: 56 year old female came to ED with dysarthria, right  facial asymmetry.  MRI Small patchy areas of acute infarct in the left posterior frontal lobe. Subjective: alert, communicative Assessment / Plan / Recommendation CHL IP CLINICAL IMPRESSIONS 06/25/2016 Clinical Impression Pt presents with functional oropharyngeal swallow with minimal motor component - there is functional mastication of all materials; mild deficits in base-of-tongue action, leading to mild vallecular residue.  When consuming a 13 mm pill in conjunction with thin liquid, trace amounts of thin barium reached the level of the vocal cords.  There was no aspiration throughout study, nor other incidents of penetration.  Esophageal screen revealed clearance of barium through LES.  Recommend advancing diet to regular solids, thin liquids; meds whole in puree.   Pt viewed video in real time and we discussed results.  No F/u warranted.  SLP Visit Diagnosis Dysphagia, unspecified (R13.10) Attention and concentration deficit following -- Frontal lobe and executive function deficit following -- Impact on safety and function --   CHL IP TREATMENT RECOMMENDATION 06/25/2016 Treatment Recommendations No treatment recommended at this time   No flowsheet data found. CHL IP DIET RECOMMENDATION 06/25/2016 SLP Diet Recommendations Regular solids;Thin liquid Liquid Administration via Cup;Straw Medication Administration Whole meds with puree Compensations -- Postural Changes --   CHL IP OTHER RECOMMENDATIONS 06/25/2016 Recommended Consults -- Oral Care Recommendations Oral care BID Other Recommendations --   CHL IP FOLLOW UP RECOMMENDATIONS 06/25/2016 Follow up Recommendations None   CHL IP FREQUENCY AND DURATION 06/24/2016 Speech Therapy Frequency (ACUTE ONLY) min 2x/week Treatment Duration 1 week      CHL IP ORAL PHASE 06/25/2016 Oral Phase WFL Oral - Pudding Teaspoon -- Oral - Pudding Cup -- Oral - Honey Teaspoon -- Oral - Honey Cup -- Oral - Nectar Teaspoon -- Oral - Nectar Cup -- Oral - Nectar Straw -- Oral - Thin Teaspoon --  Oral - Thin Cup -- Oral - Thin Straw -- Oral - Puree -- Oral - Mech Soft -- Oral - Regular -- Oral - Multi-Consistency -- Oral - Pill -- Oral Phase - Comment --  CHL IP PHARYNGEAL PHASE 06/25/2016 Pharyngeal Phase Impaired Pharyngeal- Pudding Teaspoon -- Pharyngeal -- Pharyngeal- Pudding Cup -- Pharyngeal -- Pharyngeal- Honey Teaspoon -- Pharyngeal -- Pharyngeal- Honey Cup -- Pharyngeal -- Pharyngeal- Nectar Teaspoon -- Pharyngeal -- Pharyngeal- Nectar Cup -- Pharyngeal -- Pharyngeal- Nectar Straw -- Pharyngeal -- Pharyngeal- Thin Teaspoon -- Pharyngeal -- Pharyngeal- Thin Cup Reduced airway/laryngeal closure;Penetration/Aspiration during swallow Pharyngeal Material enters airway, CONTACTS cords and then ejected out Pharyngeal- Thin Straw -- Pharyngeal -- Pharyngeal- Puree Delayed swallow initiation-vallecula;Reduced tongue base retraction;Pharyngeal residue -  valleculae Pharyngeal -- Pharyngeal- Mechanical Soft Pharyngeal residue - valleculae;Reduced tongue base retraction;Delayed swallow initiation-vallecula Pharyngeal -- Pharyngeal- Regular -- Pharyngeal -- Pharyngeal- Multi-consistency -- Pharyngeal -- Pharyngeal- Pill -- Pharyngeal -- Pharyngeal Comment --  CHL IP CERVICAL ESOPHAGEAL PHASE 06/25/2016 Cervical Esophageal Phase WFL Pudding Teaspoon -- Pudding Cup -- Honey Teaspoon -- Honey Cup -- Nectar Teaspoon -- Nectar Cup -- Nectar Straw -- Thin Teaspoon -- Thin Cup -- Thin Straw -- Puree -- Mechanical Soft -- Regular -- Multi-consistency -- Pill -- Cervical Esophageal Comment -- CHL IP GO 06/25/2016 Functional Assessment Tool Used clinical judgment Functional Limitations Swallowing Swallow Current Status (Z6109) CH Swallow Goal Status (U0454) [facility] Swallow Discharge Status (U9811) CH Motor Speech Current Status (B1478) (None) Motor Speech Goal Status (G9562) (None) Motor Speech Goal Status (Z3086) (None) Spoken Language Comprehension Current Status (V7846) (None) Spoken Language Comprehension Goal Status (N6295)  (None) Spoken Language Comprehension Discharge Status (M8413) (None) Spoken Language Expression Current Status (K4401) (None) Spoken Language Expression Goal Status (U2725) (None) Spoken Language Expression Discharge Status [phone]) (None) Attention Current Status (I3474) (None) Attention Goal Status (Q5956) (None) Attention Discharge Status (L8756) (None) Memory Current Status (E3329) (None) Memory Goal Status (J1884) (None) Memory Discharge Status (Z6606) (None) Voice Current Status (T0160) (None) Voice Goal Status (F0932) (None) Voice Discharge Status (T5573) (None) Other Speech-Language Pathology Functional Limitation Current Status (U2025) (None) Other Speech-Language Pathology Functional Limitation Goal Status (K2706) (None) Other Speech-Language Pathology Functional Limitation Discharge Status [phone]) (None) Tammy Hines 06/25/2016, 12:29 PM                 Subjective: Seen this morning. Complained of some difficulty with swallowing. Symptoms from admission have resolved without recurrence. Denies facial droop, asymmetric limb weakness, slurred speech or visual disturbances. Speech therapy has reevaluated and recommend regular solids and thin liquids. As per RN, no acute issues.  Discharge Exam:  Vitals:   06/27/16 0347 06/27/16 0457 06/27/16 1011 06/27/16 1043  BP:  118/72  120/73  Pulse:  88  95  Resp:  18  19  Temp:  97.7 F (36.5 C)  98 F (36.7 C)  TempSrc:  Axillary  Oral  SpO2:  93% 93% 94%  Weight: 134.9 kg (297 lb 6.4 oz)     Height:        General exam: Pleasant middle-aged female, morbidly obese, sitting up comfortably in bed. Respiratory system: Clear to auscultation. Respiratory effort normal. Cardiovascular system: S1 & S2 heard, RRR. No JVD, murmurs, rubs, gallops or clicks. No pedal edema.Telemetry: Sinus rhythm-occasional ST in the 100s..  Gastrointestinal system: Abdomen is nondistended, soft and nontender. No organomegaly or masses felt. Normal bowel  sounds heard. Central nervous system: Alert and oriented. No dysarthria. Diminished right nasolabial fold. Extremities: Symmetric 5 x 5 power. Skin: No rashes, lesions or ulcers Psychiatry: Judgement and insight appear normal. Mood & affect appropriate.     The results of significant diagnostics from this hospitalization (including imaging, microbiology, ancillary and laboratory) are listed below for reference.     Microbiology: No results found for this or any previous visit (from the past 240 hour(s)).   Labs: CBC:  Recent Labs Lab 06/24/16 0406 06/24/16 0421 06/25/16 0358  WBC 8.8  --  10.5  NEUTROABS 5.3  --   --   HGB 12.4 12.6 12.6  HCT 39.2 37.0 40.2  MCV 87.3  --  88.0  PLT 228  --  268   Basic Metabolic Panel:  Recent Labs Lab 06/24/16 0406 06/24/16  0421 06/24/16 1150 06/25/16 0358 06/26/16 0404  NA 136 139  --  139 143  K 3.8 3.9  --  4.2 4.2  CL 100* 102  --  106 108  CO2 27  --   --  24 27  GLUCOSE 109* 114*  --  101* 96  BUN 18 19  --  11 12  CREATININE 0.86 0.80 0.86 0.73 0.80  CALCIUM 9.0  --   --  9.4 9.2   Liver Function Tests:  Recent Labs Lab 06/24/16 0406  AST 23  ALT 25  ALKPHOS 73  BILITOT 0.5  PROT 6.2*  ALBUMIN 3.5   BNP (last 3 results)  Recent Labs  08/30/15 1024 12/26/15 1533 02/06/16 1203  BNP 26.9 108.3* 66.5   CBG:  Recent Labs Lab 06/24/16 0308  GLUCAP 131*   Hgb A1c  Recent Labs  06/25/16 0358  HGBA1C 6.2*   Lipid Profile  Recent Labs  06/25/16 0358  CHOL 177  HDL 38*  LDLCALC 98  TRIG 161207*  CHOLHDL 4.7   Urinalysis    Component Value Date/Time   COLORURINE YELLOW 06/24/2016 0829   APPEARANCEUR CLEAR 06/24/2016 0829   LABSPEC 1.011 06/24/2016 0829   PHURINE 7.0 06/24/2016 0829   GLUCOSEU NEGATIVE 06/24/2016 0829   HGBUR NEGATIVE 06/24/2016 0829   BILIRUBINUR NEGATIVE 06/24/2016 0829   KETONESUR NEGATIVE 06/24/2016 0829   PROTEINUR NEGATIVE 06/24/2016 0829   NITRITE NEGATIVE  06/24/2016 0829   LEUKOCYTESUR NEGATIVE 06/24/2016 0829    Discussed in detail with patient's boyfriend at bedside.  Time coordinating discharge: Over 30 minutes  SIGNED:  Marcellus ScottHONGALGI,Debi Cousin, MD, FACP, FHM. [facility] Pager [phone]  If 7PM-7AM, please contact night-coverage [email or web address] Password [email or web address] 06/27/2016, 1:20 PM

## 2016-06-27 NOTE — Progress Notes (Signed)
STROKE TEAM PROGRESS NOTE   SUBJECTIVE (INTERVAL HISTORY)  .TEE showed a small PFO but Lower extremity venous Dopplers were negative. Loop recorder has been placed OBJECTIVE Temp:  [97.7 F (36.5 C)-98.1 F (36.7 C)] 98 F (36.7 C) (03/23 1043) Pulse Rate:  [85-100] 95 (03/23 1043) Cardiac Rhythm: Normal sinus rhythm (03/23 0805) Resp:  [16-20] 19 (03/23 1043) BP: (118-146)/(63-88) 120/73 (03/23 1043) SpO2:  [93 %-96 %] 94 % (03/23 1043) Weight:  [297 lb 6.4 oz (134.9 kg)] 297 lb 6.4 oz (134.9 kg) (03/23 0347)  CBC:   Recent Labs Lab 06/24/16 0406 06/24/16 0421 06/25/16 0358  WBC 8.8  --  10.5  NEUTROABS 5.3  --   --   HGB 12.4 12.6 12.6  HCT 39.2 37.0 40.2  MCV 87.3  --  88.0  PLT 228  --  268    Basic Metabolic Panel:   Recent Labs Lab 06/25/16 0358 06/26/16 0404  NA 139 143  K 4.2 4.2  CL 106 108  CO2 24 27  GLUCOSE 101* 96  BUN 11 12  CREATININE 0.73 0.80  CALCIUM 9.4 9.2   HgbA1c:  Lab Results  Component Value Date   HGBA1C 6.2 (H) 06/25/2016   PHYSICAL EXAM Pleasant middle aged lady not in distress. . Afebrile. Head is nontraumatic. Neck is supple without bruit.    Cardiac exam no murmur or gallop. Lungs are clear to auscultation. Distal pulses are well felt. Neurological Exam :  Awake alert oriented x 3 normal   Language but mild dysarthria. Mild right lower face asymmetry. Tongue midline. No drift. Mild diminished fine finger movements on right. Orbits left over right upper extremity. Mild right grip weak.. Normal sensation . Normal coordination.   ASSESSMENT/PLAN Tammy Hines is a 56 y.o. female with history of severe anxiety, depression, borderline personality / bipolar disorder 1 , HTN, HLD, GERD, asthma  presenting with R arm weakness, R facial droop, slurred speech. She did not receive IV t-PA due to being out of the window, completed stroke on MRI.   Stroke:   L posterior frontal cortical and subcortical infarct embolic secondary to  unknown source, workup underway  CT head no acute abnormality  MRI  L posterior frontal cortical and white matter infarct  CTA head  pending   Carotid Doppler  No significant ICA stenosis. B thyroid heterogenous areas, consider US 2D Echo  Left ventricle: The cavity size was normal. Wall thickness was   normal. Systolic function was normal. The estimated ejection   fraction was in the range of 60% to 65%. Wall motion was normal;    there were no regional wall motion abnormalities TEE small PFO no clots  LDL 98  HgbA1c 6.2  Heparin 5000 units sq tid for VTE prophylaxis Diet Heart Room service appropriate? Yes; Fluid consistency: Thin Diet - low sodium heart healthy No antithrombotic prior to admission, now on No antithrombotic. Added aspirin 325 mg daily for secondary stroke prevention  Therapy recommendations:  pending   Disposition:  pending   Hypertension  Stable Permissive hypertension (OK if < 220/120) but gradually normalize in 5-7 days Long-term BP goal normotensive  Hyperlipidemia  Home meds:  zocor 20, resumed in hospital  LDL 98 goal  Consider increase statin dose  Continue statin at discharge  Other Stroke Risk Factors  Cigarette smoker, advised to stop smoking  UDS positive for benzos  Morbid Obesity, Body mass index is 48 kg/m.  Other Active Problems  Anxiety  Bipolar disorder  GERD  Hospital day # 2    I have personally examined this patient, reviewed notes, independently viewed imaging studies, participated in medical decision making and plan of care.ROS completed by me personally and pertinent positives fully documented  I have made any additions or clarifications directly to the above note.   She presented with embolic left brain infarct. Continue aspirin for stroke prevention and follow-up as an outpatient in the stroke clinic in 6 weeks. Stroke team will sign off  Delia Heady, MD Medical Director [facility] Pager:  [phone] 06/27/2016 4:46 PM  To contact Stroke Continuity provider, please refer to [email or web address]. After hours, contact General Neurology

## 2016-06-27 NOTE — Evaluation (Signed)
Clinical/Bedside Swallow Evaluation Patient Details  Name: Tammy JarvisGretchen K Hines MRN: [id number] Date of Birth: 1960-09-16  Today's Date: 06/27/2016 Time: SLP Start Time (ACUTE ONLY): 1150 SLP Stop Time (ACUTE ONLY): 1219 SLP Time Calculation (min) (ACUTE ONLY): 29 min  Past Medical History:  Past Medical History:  Diagnosis Date  . Anxiety   . Arthritis    "right knee, left hip, going down my neck into my right shoulder" (06/24/2016)  . Asthma   . Bipolar 1 disorder (HCC)   . Chronic lower back pain   . Chronic neck pain   . COPD (chronic obstructive pulmonary disease) (HCC)   . Depression    [name]/notes 06/24/2016  . Family history of adverse reaction to anesthesia    "makes my mom throw up"   . Fibromyalgia   . GERD (gastroesophageal reflux disease)    [name]/notes 06/24/2016  . Hepatitis B   . History of hiatal hernia   . History of stomach ulcers   . History of transient ischemic attack (TIA)    /RN 06/24/2016  . Hyperlipidemia   . Hypertension   . Ischemic stroke (HCC)    acute/notes 06/24/2016; "left sided weakness"/RN (06/24/2016  . Migraine    "a few/year now" (06/24/2016)  . Obesity   . PONV (postoperative nausea and vomiting)    Past Surgical History:  Past Surgical History:  Procedure Laterality Date  . ANAL FISSURE REPAIR    . FRACTURE SURGERY    . LOOP RECORDER INSERTION N/A 06/26/2016   Procedure: Loop Recorder Insertion;  Surgeon: Hillis RangeJames Allred, MD;  Location: [facility] INVASIVE CV LAB;  Service: Cardiovascular;  Laterality: N/A;  . MULTIPLE TOOTH EXTRACTIONS  2018   "I had 6 teeth pulled"  . SHOULDER ARTHROSCOPY W/ ROTATOR CUFF REPAIR Right 2000  . SHOULDER SURGERY Right 1999  . TEE WITHOUT CARDIOVERSION N/A 06/26/2016   Procedure: TRANSESOPHAGEAL ECHOCARDIOGRAM (TEE);  Surgeon: Thurmon FairMihai Croitoru, MD;  [facility];  Service: Cardiovascular;  Laterality: N/A;   HPI:  56 year old female came to ED with dysarthria, right facial asymmetry.  MRI Small patchy areas of acute  infarct in the left posterior frontal lobe.  MBS 3/21, upon re-examination of video, revealed episode of penetration to the vocal folds when consuming thin liquids in conjunction with solids.   Pt subsequently started on regular diet, thin liquids.  Self-reporting ongoing difficulty swallowing to RN; new bedside swallow eval ordered.    Assessment / Plan / Recommendation Clinical Impression  Pt having some anxiety about swallowing.  Re-evaluated while she ate her lunch; she demonstrated excellent overall toleration with all phases of the swallow; no s/s of aspiration.  We discussed the mild motor and sensory deficits that accompanied her stroke, and the impact on sensation/sluggishness with swallow.  We discussed that occasional coughing is normal, and in her case, protective.  Pt verbalized understanding of current status and that her body is compensating correctly for swallowing issues.  No further SLP f/u needed - regular diet, thin liquids, meds whole in puree. D/W RN. SLP Visit Diagnosis: Dysphagia, unspecified (R13.10)    Aspiration Risk  Mild aspiration risk    Diet Recommendation   regular solids, thin liquids  Medication Administration: Whole meds with puree    Other  Recommendations Oral Care Recommendations: Oral care BID   Follow up Recommendations    none    Frequency and Duration            Prognosis  Swallow Study   General Date of Onset: 06/23/16 HPI: 56 year old female came to ED with dysarthria, right facial asymmetry.  MRI Small patchy areas of acute infarct in the left posterior frontal lobe.  MBS 3/21, upon re-examination of video, revealed episode of penetration to the vocal folds when consuming thin liquids in conjunction with solids.   Pt subsequently started on regular diet, thin liquids.  Self-reporting ongoing difficulty swallowing to RN; new bedside swallow eval ordered.  Type of Study: Bedside Swallow Evaluation Previous Swallow Assessment: see  hx Diet Prior to this Study: Regular;Thin liquids Temperature Spikes Noted: No Respiratory Status: Room air History of Recent Intubation: No Behavior/Cognition: Alert;Cooperative Oral Cavity Assessment: Within Functional Limits Oral Care Completed by SLP: No Oral Cavity - Dentition: Adequate natural dentition;Missing dentition Vision: Functional for self-feeding Self-Feeding Abilities: Able to feed self Patient Positioning: Upright in bed Baseline Vocal Quality: Normal Volitional Cough: Strong Volitional Swallow: Able to elicit    Oral/Motor/Sensory Function Overall Oral Motor/Sensory Function: Mild impairment Facial ROM: Reduced right;Suspected CN VII (facial) dysfunction Facial Symmetry: Abnormal symmetry right;Suspected CN VII (facial) dysfunction Facial Strength: Reduced right;Suspected CN VII (facial) dysfunction Lingual Symmetry: Abnormal symmetry right;Suspected CN XII (hypoglossal) dysfunction   Ice Chips Ice chips: Within functional limits   Thin Liquid Thin Liquid: Within functional limits    Nectar Thick Nectar Thick Liquid: Not tested   Honey Thick Honey Thick Liquid: Not tested   Puree Puree: Within functional limits   Solid   GO   Solid: Within functional limits        Blenda Mounts Laurice 06/27/2016,12:24 PM

## 2016-06-27 NOTE — Care Management Note (Signed)
Case Management Note  Patient Details  Name: Tammy Hines MRN: [id number] Date of Birth: 1960/05/28  Subjective/Objective:                    Action/Plan: Pt discharging home with orders for [facility] services. CM met with the patient and provided her a list of [facility] agencies. She selected [place]. Santiago Glad with [facility] notified and accepted the referral.  Pt with orders for 3 n 1. Pt states she does not want this equipment. Pt states she has transportation home.  Expected Discharge Date:                  Expected Discharge Plan:  [place]  In-House Referral:     Discharge planning Services  CM Consult  Post Acute Care Choice:  [facility] (Pt refused 3 in 1) Choice offered to:  Patient  DME Arranged:    DME Agency:     HH Arranged:  PT, OT HH Agency:  [place]  Status of Service:  Completed, signed off  If discussed at [place] of Stay Meetings, dates discussed:    Additional Comments:  Pollie Friar, RN 06/27/2016, 12:38 PM

## 2016-06-30 ENCOUNTER — Other Ambulatory Visit: Payer: Self-pay

## 2016-06-30 NOTE — Patient Outreach (Signed)
[facility]) Care Management  06/30/2016  Tammy Hines 02-18-1961 [id number]  REFERRAL DATE: 06/30/16 REFERRAL SOURCE: EMMI stroke program REFERRAL REASON: EMMI stroke red alert referral for : Scheduled follow up appointment: NO, Able to take every dose of medicine: NO, Problems setting up rehab: YES  PROVIDERS: Tammy Hines: primary MD [facility] (patient unsure of doctor/ new referral) Tammy Hines: Neurologist  SOCIAL: Patient lives with boyfriend Tammy Hines.  Patient needs assistance with transportation.  SUBJECTIVE; Telephone call to patient regarding EMMI stroke red alert. HIPAA verified with patient. Patient states she was unable to answer the automated EMMI stroke call today. Reports today being the first call from the automated service since her discharge on Friday 06/27/16.  Patient states she has an appointment scheduled with her primary MD for this afternoon. Patient reports she has a follow up appointment with the cardiologist on 07/07/16.  RNCM informed patient per her discharge instructions that Tammy Hines' neurology office will call and schedule an appointment. Patient verbalized understanding.  Patient states she needs to talk with her doctor about her medications. Patient states she will be picking up her prescriptions for the aspirin and simvastatin. Patient states the doctor in the hospital increased her simvastatin dose to 40 mg per day. Patient states she is currently taking a bayer aspirin and is taking 2 of her 20 mg simvastatin tablets until she is able to pick up the prescriptions today. Patient states will mention to her doctor about the request for her to take albuterol. Patient states she was told to take albuterol by the doctor in the hospital. Patient states she was never given a prescription for it so she will see if her primary MD will write one.  Patient states she stopped taking her lasix over the last several days because she has been  wetting on herself. Patient states she had this problem previously and was seen by a urologist who prescribed her medication. Patient states this problem had resolved. Patient reports while she was in the hospital she was not given one of the medications that helped with the incontinence. Patient states since discharge from the hospital she has started back taking the medication but is still having the urinary incontinence. Patient states she will discuss this further with her doctor.  Patient states she is having residual from the stroke. Patient reports she has some weakness in her legs and  Arms and is still having difficulty with her swallowing. Patient states she is on a liquid diet because she is having difficulty with solid foods. Patient states she was seen on Saturday, 06/28/16 by the [facility] nurse. Patient states she will received physical therapy, occupational therapy, and speech therapy services. Patient states she has packet of information from [facility] with contact phone  number if needed.  Patient states she has difficulty with transportation. Patient states she has support from her boyfriend but states he has health issues now that require him to be at the doctors often. Patient states she lives in [place], [place] and [place] in [place]. Patient states she has to see her primary MD every month due to being on a narcotic. Patient states she needs assistance with transportation.   RNCM informed patient of [facility] care management EMMI stroke program. RNCM advised patient that she can refer her to social worker to discuss transportation options with her. Patient verbally agreed.  RNCM discussed signs/ symptoms of stroke with patient. Advised to call 911  for stroke like symptoms. RNCM advised patient to contact her doctor for other non emergent medical concerns.  RNCM informed patient that she will follow up with her within a week. Patient verbally agreed.   ASSESSMENT;   Admit date: 06/24/2016 Discharge date: 06/27/2016  Recommendations for Outpatient Follow-up:  1. Dr. Quitman Livings, PCP in 1 week. 2. On Carotid Doppler study, heterogeneous area is noted in bilateral thyroid and may consider outpatient thyroid ultrasound per PCP 3. Dr. Delia Heady, Neurology: I have sent electronic request for office to call patient with a follow-up appointment in 8 weeks. 4. [facility] on 07/07/16 at 10:30 AM for ILR site wound check.  Home Health: PT & OT Equipment/Devices: Patient declined 3 n 1    Discharge Condition: Improved and stable  CODE STATUS: Full  Diet recommendation: Heart healthy diet.  Discharge Diagnoses:  Principal Problem:   Acute ischemic stroke [facility]) Active Problems:   Depression   Asthma   GERD   DISC DISEASE, LUMBAR   Bipolar disorder (HCC)   Hypertension   Hyperlipidemia   Anxiety   Stroke (cerebrum) [facility])   Brief Summary: 56 year old female with PMH of severe anxiety, depression, borderline personality/bipolar disorder, HTN, HLD, GERD, asthma, morbid obesity.   ASSESSMENT; RNCM will follow up with patient within 1 week.  RNCM will refer patient to social worker for transportation assistance.   Tammy Ina RN,BSN,CCM [facility] Telephonic  [phone]

## 2016-06-30 NOTE — Patient Outreach (Signed)
[facility]) Care Management  06/30/2016  Tammy JarvisGretchen K Hines 07-24-1960 [id number]   Patient triggered Red on EMMI Stroke Dashboard, notification sent To:  George Inaavina Green, RN

## 2016-07-07 ENCOUNTER — Other Ambulatory Visit: Payer: Self-pay | Admitting: *Deleted

## 2016-07-07 ENCOUNTER — Ambulatory Visit: Payer: Self-pay

## 2016-07-07 ENCOUNTER — Ambulatory Visit: Payer: Medicare Other

## 2016-07-07 NOTE — Patient Outreach (Signed)
[facility]) Care Management  07/07/2016  Tammy Hines 07-26-1960 [id number]  CSW received referral for transportation assistance.  CSW made an initial attempt to try and contact patient today to perform phone assessment, as well as assess and assist with social work needs and services, without success.  A HIPAA compliant message was left for patient on voicemail. CSW will try again in 1-3 days if no return call is received.    [name], MSW, LCSW Clinical Social Worker  [facility] [phone]

## 2016-07-08 ENCOUNTER — Other Ambulatory Visit: Payer: Self-pay | Admitting: *Deleted

## 2016-07-08 NOTE — Patient Outreach (Signed)
[facility]) Care Management  07/08/2016  Tammy Hines Dec 07, 1960 [id number]    CSW rec'd return call from patient and identity was confirmed. CSW introduced self and role as well as reason for CSW referral; transportation.  Patient reports she lives in [place] alone; her boy friend stays with her some and they "help out each other because he has COPD' and he is able to transport her to/from most appointments but is interested in services that may provide her with ride(s)).   CSW discussed possible community resource services that may assist including SCAT. CSW will mail SCAT application(s) to patient for her and her boyfriend and plan follow up call to further assist and offer visit for assessment.   [name], MSW, LCSW Clinical Social Worker  [facility] [phone]

## 2016-07-08 NOTE — Patient Outreach (Signed)
[facility]) Care Management  07/08/2016  SONJA MANSEAU November 10, 1960 [id number]   Mailed out community resources per [name], LCSW

## 2016-07-09 ENCOUNTER — Other Ambulatory Visit: Payer: Self-pay | Admitting: *Deleted

## 2016-07-09 ENCOUNTER — Other Ambulatory Visit: Payer: Self-pay

## 2016-07-09 NOTE — Patient Outreach (Signed)
[facility]) Care Management  07/09/2016  Tammy Hines 05-Feb-1961 [id number]  REFERRAL DATE: 06/30/16 REFERRAL SOURCE: EMMI stroke program EMMI stroke follow up telephone visit  PROVIDERS: Dr. Quitman Livings: primary MD [facility] care (patient unsure of doctor/ new referral) Dr. Pearlean Brownie: Neurologist  SOCIAL: Patient lives with boyfriend Tammy Hines.  Patient needs assistance with transportation.  Telephone call to patient regarding EMMI stroke follow up.  HIPAA verified with patient. Patient states she has had contact with [facility] care management social worker who is working on transportation assistance for her. Patient states she received a call from [employer] on yesterday to work on her application process.  Patient states she had a follow up visit with her primary  MD on last week. Patient states her doctor increased her Myrbetriq to two pills. Patient states this has helped and the incontinence has stopped. Patient states she now has her medications simvastatin and aspirin.  Patient states she has all of her medications and is taking them as prescribed. Patient reports the therapist with [facility] will be making home visit today. Patient states she had to change her appointment with the cardiologist to 07/14/16 due to a appointment conflict with her fiance.  Patient states her fiance will be able to take her to this appointment. Patient denies any other needs at this time.  RNCM reviewed signs/ symptoms of stroke. Advised patient to call 911 for stroke like symptoms. Patient states she is aware of how to contact her doctor after hours for other concerns.   PLAN;  RNCM will notify care management assistant that patient will be closed to telephonic care management follow up.  Social worker to continue follow up with patient.   [name] RN,BSN,CCM [facility] Telephonic  [phone]

## 2016-07-14 ENCOUNTER — Ambulatory Visit: Payer: Medicare Other

## 2016-07-18 ENCOUNTER — Other Ambulatory Visit: Payer: Self-pay | Admitting: *Deleted

## 2016-07-21 ENCOUNTER — Ambulatory Visit: Payer: Self-pay | Admitting: *Deleted

## 2016-07-22 ENCOUNTER — Ambulatory Visit: Payer: Self-pay | Admitting: *Deleted

## 2016-07-23 ENCOUNTER — Ambulatory Visit (INDEPENDENT_AMBULATORY_CARE_PROVIDER_SITE_OTHER): Payer: Self-pay | Admitting: *Deleted

## 2016-07-23 DIAGNOSIS — I639 Cerebral infarction, unspecified: Secondary | ICD-10-CM

## 2016-07-23 LAB — CUP PACEART INCLINIC DEVICE CHECK
Date Time Interrogation Session: 20180418152758
Implantable Pulse Generator Implant Date: 20180322

## 2016-07-23 NOTE — Progress Notes (Signed)
Loop wound check in clinic. Steri-strips removed by patient prior to appt. Wound well healed, without redness, swelling or drainage. Battery status: good. R-waves .12mV. No episodes. Monthly summary reports and ROV with JA PRN. Carelink monitor transmitting.

## 2016-07-28 ENCOUNTER — Ambulatory Visit (INDEPENDENT_AMBULATORY_CARE_PROVIDER_SITE_OTHER): Payer: Medicare Other | Admitting: *Deleted

## 2016-07-28 DIAGNOSIS — I639 Cerebral infarction, unspecified: Secondary | ICD-10-CM | POA: Diagnosis not present

## 2016-07-29 ENCOUNTER — Other Ambulatory Visit: Payer: Self-pay | Admitting: *Deleted

## 2016-07-29 NOTE — Progress Notes (Signed)
Carelink Summary Report / Loop Recorder 

## 2016-07-29 NOTE — Patient Outreach (Signed)
[facility]) Care Management  07/29/2016  Tammy Hines 07/22/1960 [id number]   CSW spoke with patient by phone today- she has not completed the SCAT application and is now agreeable to CSW visit for assessment, consent and services.  CSW has scheduled an in-home visit for 08/12/16 at 3pm.   [name], MSW, LCSW Clinical Social Worker  [facility] [phone]

## 2016-08-12 ENCOUNTER — Other Ambulatory Visit: Payer: Self-pay | Admitting: *Deleted

## 2016-08-13 ENCOUNTER — Encounter: Payer: Self-pay | Admitting: *Deleted

## 2016-08-13 NOTE — Patient Outreach (Signed)
[facility]) Care Management  08/13/2016  Tammy Hines 02-Aug-1960 [id number]   CSW assisted patient with completion of SCAT application. CSW advised patient [facility]  regarding plans to follow up in 2 weeks for update on application and other services/needs.     Reece LevyJanet Lucciana Head, MSW, LCSW Clinical Social Worker  [facility] [phone]

## 2016-08-15 LAB — CUP PACEART REMOTE DEVICE CHECK
Implantable Pulse Generator Implant Date: 20180322
MDC IDC SESS DTM: 20180421193746

## 2016-08-25 ENCOUNTER — Ambulatory Visit (INDEPENDENT_AMBULATORY_CARE_PROVIDER_SITE_OTHER): Payer: Medicare Other | Admitting: *Deleted

## 2016-08-25 DIAGNOSIS — I639 Cerebral infarction, unspecified: Secondary | ICD-10-CM

## 2016-08-26 NOTE — Progress Notes (Signed)
Carelink Summary Report / Loop Recorder 

## 2016-08-28 LAB — CUP PACEART REMOTE DEVICE CHECK
MDC IDC PG IMPLANT DT: 20180322
MDC IDC SESS DTM: 20180521200752

## 2016-09-17 ENCOUNTER — Other Ambulatory Visit: Payer: Self-pay | Admitting: *Deleted

## 2016-09-18 ENCOUNTER — Encounter: Payer: Self-pay | Admitting: *Deleted

## 2016-09-18 ENCOUNTER — Other Ambulatory Visit: Payer: Self-pay | Admitting: *Deleted

## 2016-09-18 NOTE — Patient Outreach (Signed)
  [facility]) Care Management  09/18/2016  Elyse JarvisGretchen K Ante 07-16-1960 [id number]   CSW contacted patient who reports she has been getting rides to appointments from her friend. CSW advised her that [place]AT indicates she lives out of their district. CSW provided her with Medicaid  Transportation #. She denies any further needs from CSW and declines further follow up.  CSW will close referral at this time. CSW will advise PCP and [facility] team.    Reece LevyJanet Jaxxon Naeem, MSW, LCSW Clinical Social Worker  [facility] [phone]

## 2016-09-24 ENCOUNTER — Ambulatory Visit (INDEPENDENT_AMBULATORY_CARE_PROVIDER_SITE_OTHER): Payer: Medicare Other | Admitting: *Deleted

## 2016-09-24 DIAGNOSIS — I639 Cerebral infarction, unspecified: Secondary | ICD-10-CM | POA: Diagnosis not present

## 2016-09-25 NOTE — Progress Notes (Signed)
Carelink Summary Report / Loop Recorder 

## 2016-10-01 LAB — CUP PACEART REMOTE DEVICE CHECK
MDC IDC PG IMPLANT DT: 20180322
MDC IDC SESS DTM: 20180620201220

## 2016-10-06 ENCOUNTER — Ambulatory Visit: Payer: Self-pay | Admitting: Neurology

## 2016-10-06 ENCOUNTER — Telehealth: Payer: Self-pay

## 2016-10-06 NOTE — Telephone Encounter (Signed)
PATIENT WAS A NO SHOW TODAY. SHE RESCHEDULE FOR AUGUST 2018.

## 2016-10-07 ENCOUNTER — Encounter: Payer: Self-pay | Admitting: Neurology

## 2016-10-24 ENCOUNTER — Ambulatory Visit (INDEPENDENT_AMBULATORY_CARE_PROVIDER_SITE_OTHER): Payer: Medicare Other | Admitting: *Deleted

## 2016-10-24 DIAGNOSIS — I639 Cerebral infarction, unspecified: Secondary | ICD-10-CM

## 2016-10-28 NOTE — Progress Notes (Signed)
Carelink Summary Report / Loop Recorder 

## 2016-10-29 ENCOUNTER — Telehealth: Payer: Self-pay | Admitting: Cardiology

## 2016-10-29 NOTE — Telephone Encounter (Signed)
Attempted to call pt b/c her home monitor has not updated in at least 14 days. No answer and unable to leave a message.  

## 2016-11-06 ENCOUNTER — Encounter: Payer: Self-pay | Admitting: Neurology

## 2016-11-06 ENCOUNTER — Ambulatory Visit (INDEPENDENT_AMBULATORY_CARE_PROVIDER_SITE_OTHER): Payer: Medicare Other | Admitting: Neurology

## 2016-11-06 VITALS — BP 130/62 | HR 72 | Ht 67.0 in | Wt 278.0 lb

## 2016-11-06 DIAGNOSIS — I639 Cerebral infarction, unspecified: Secondary | ICD-10-CM | POA: Diagnosis not present

## 2016-11-06 NOTE — Patient Instructions (Addendum)
I had a long d/w patient about her recent cryptogenic stroke,PFO,isk for recurrent stroke/TIAs, personally independently reviewed imaging studies and stroke evaluation results and answered questions.Continue aspirin 81 mg daily  for secondary stroke prevention and maintain strict control of hypertension with blood pressure goal below 130/90, diabetes with hemoglobin A1c goal below 6.5% and lipids with LDL cholesterol goal below 70 mg/dL. I also advised the patient to eat a healthy diet with plenty of whole grains, cereals, fruits and vegetables, exercise regularly and maintain ideal body weight .She has a ROPE score of 6 points which gives her a 62% chance that the stroke is due to PFO with a 8% risk of stroke recurrence over 2 years. The patient does not want to consider endovascular PFO closure at the present time and would like to try medical therapy first. She will call me if she changes her mind Followup in the future with my nurse practitioner in 6 months or call earlier if necessary.  Stroke Prevention Some medical conditions and behaviors are associated with an increased chance of having a stroke. You may prevent a stroke by making healthy choices and managing medical conditions. How can I reduce my risk of having a stroke?  Stay physically active. Get at least 30 minutes of activity on most or all days.  Do not smoke. It may also be helpful to avoid exposure to secondhand smoke.  Limit alcohol use. Moderate alcohol use is considered to be: ? No more than 2 drinks per day for men. ? No more than 1 drink per day for nonpregnant women.  Eat healthy foods. This involves: ? Eating 5 or more servings of fruits and vegetables a day. ? Making dietary changes that address high blood pressure (hypertension), high cholesterol, diabetes, or obesity.  Manage your cholesterol levels. ? Making food choices that are high in fiber and low in saturated fat, trans fat, and cholesterol may control  cholesterol levels. ? Take any prescribed medicines to control cholesterol as directed by your health care provider.  Manage your diabetes. ? Controlling your carbohydrate and sugar intake is recommended to manage diabetes. ? Take any prescribed medicines to control diabetes as directed by your health care provider.  Control your hypertension. ? Making food choices that are low in salt (sodium), saturated fat, trans fat, and cholesterol is recommended to manage hypertension. ? Ask your health care provider if you need treatment to lower your blood pressure. Take any prescribed medicines to control hypertension as directed by your health care provider. ? If you are 6518-57 years of age, have your blood pressure checked every 3-5 years. If you are 57 years of age or older, have your blood pressure checked every year.  Maintain a healthy weight. ? Reducing calorie intake and making food choices that are low in sodium, saturated fat, trans fat, and cholesterol are recommended to manage weight.  Stop drug abuse.  Avoid taking birth control pills. ? Talk to your health care provider about the risks of taking birth control pills if you are over 57 years old, smoke, get migraines, or have ever had a blood clot.  Get evaluated for sleep disorders (sleep apnea). ? Talk to your health care provider about getting a sleep evaluation if you snore a lot or have excessive sleepiness.  Take medicines only as directed by your health care provider. ? For some people, aspirin or blood thinners (anticoagulants) are helpful in reducing the risk of forming abnormal blood clots that can lead to stroke.  If you have the irregular heart rhythm of atrial fibrillation, you should be on a blood thinner unless there is a good reason you cannot take them. ? Understand all your medicine instructions.  Make sure that other conditions (such as anemia or atherosclerosis) are addressed. Get help right away if:  You have  sudden weakness or numbness of the face, arm, or leg, especially on one side of the body.  Your face or eyelid droops to one side.  You have sudden confusion.  You have trouble speaking (aphasia) or understanding.  You have sudden trouble seeing in one or both eyes.  You have sudden trouble walking.  You have dizziness.  You have a loss of balance or coordination.  You have a sudden, severe headache with no known cause.  You have new chest pain or an irregular heartbeat. Any of these symptoms may represent a serious problem that is an emergency. Do not wait to see if the symptoms will go away. Get medical help at once. Call your local emergency services (911 in [place] Do not drive yourself to the hospital. This information is not intended to replace advice given to you by your health care provider. Make sure you discuss any questions you have with your health care provider. Document Released: 05/01/2004 Document Revised: 08/30/2015 Document Reviewed: 09/24/2012 Elsevier Interactive Patient Education  2017 [employer]

## 2016-11-06 NOTE — Progress Notes (Signed)
[facility] [address] [place]. [place] [address] [phone]       OFFICE FOLLOW-UP NOTE  Tammy. Tammy Hines Date of Birth:  Jul 14, 1960 Medical Record Number:  [id number]   HPI: Tammy Hines is a 65 year obese Caucasian lady seen today for first office follow-up visit following hospital admission for stroke in March 2018. History is obtained from the patient, review of electronic medical records and I have personally reviewed imaging films..55-yo woman who presented to the ED for the evaluation of facial droop. She was last known to be normal at 1900 on 06/23/16. She apparently woke up at about 0100 this morning to get a drink of water at which time she noted that she had a R facial droop and that the water was running out of her mouth. She also noticed that she was having some slurred speech that was initially bad enough that people were having difficulty understanding her. She was unable to hold and pick up things with her right hand and had difficulty getting dressed so she could come to the hospital. They called 911 and she was brought to the ED for evaluation. Since arrival in the ED, all of her symptoms have improved. MRI of the brain was obtained and showed acute ischemic stroke. Neurology is now consulted for further management of her stroke. Last known well: 1900 on  06/23/16.NHISS score: 3 (facial droop, R sensory loss, dysarthria) tPA given?: No, out of window with complete stroke on MRI. MRI scan showed a left posterior frontal cortical and subcortical embolic infarct. CT angiogram showed no significant echogenic second stenosis. Carotid Doppler showed no significant extracranial stenosis but the heterogeneous thyroid mass possibly a goiter was noted. Transfer the echo showed normal ejection fraction. Transesophageal echocardiogram was performed and was unremarkable except for a small patent foramen ovale. LDL cholesterol 98 mg percent and hemoglobin A1c was 6.2. Patient  was started on aspirin and showed improvement in her speech and facial weakness. She was continued on Zocor. She states she's done well since discharge her speech is still and occasionally stuttering and slurred but it'll eventually tired or anxious. She's been having significant knee and back problems and has not been able to walk very well. She'll use of a walker but still has had several falls but fortunately no major injury. She has a loop recorder inserted but so far Paroxysmal atrial  fibrillation has not yet been found. She also complains of some swallowing difficulties which existed even before the stroke which may be more pronounced. She's been eating healthy and fact has lost about 20 pounds. She is tolerating: Aspirin well without bruising or bleeding. She also has underlying anxiety and depression for which she sees psychiatrist Dr. droop and the cough her blood pressure is well controlled and today it is 130/62. ROS:   14 system review of systems is positive for  Fatigue, leg swelling, trouble swallowing, skin moles, shortness of breath, cough, easy bruising, increased thirst, aching muscles, weakness, depression and all other systems negative  PMH:  Past Medical History:  Diagnosis Date  . Anxiety   . Arthritis    "right knee, left hip, going down my neck into my right shoulder" (06/24/2016)  . Asthma   . Bipolar 1 disorder (HCC)   . Chronic lower back pain   . Chronic neck pain   . COPD (chronic obstructive pulmonary disease) (HCC)   . Depression    [name] 06/24/2016  . Family history of adverse reaction  to anesthesia    "makes my mom throw up"   . Fibromyalgia   . GERD (gastroesophageal reflux disease)    [name]/notes 06/24/2016  . Hepatitis B   . History of hiatal hernia   . History of stomach ulcers   . History of transient ischemic attack (TIA)    /RN 06/24/2016  . Hyperlipidemia   . Hypertension   . Ischemic stroke (HCC)    acute/notes 06/24/2016; "left sided weakness"/RN  (06/24/2016  . Migraine    "a few/year now" (06/24/2016)  . Obesity   . PONV (postoperative nausea and vomiting)     Social History:  Social History   Social History  . Marital status: Single    Spouse name: N/A  . Number of children: N/A  . Years of education: N/A   Occupational History  . Not on file.   Social History Main Topics  . Smoking status: Current Every Day Smoker    Packs/day: 0.25    Years: 25.00    Types: Cigarettes  . Smokeless tobacco: Never Used  . Alcohol use 0.6 oz/week    1 Glasses of wine per week     Comment: 06/24/2016 "I'll have a drink once/year"  . Drug use: No  . Sexual activity: No   Other Topics Concern  . Not on file   Social History Narrative   Lives alone.      Medications:   Current Outpatient Prescriptions on File Prior to Visit  Medication Sig Dispense Refill  . albuterol (PROVENTIL HFA;VENTOLIN HFA) 108 (90 Base) MCG/ACT inhaler Inhale 2 puffs into the lungs every 6 (six) hours as needed for wheezing or shortness of breath.    . alprazolam (XANAX) 2 MG tablet Take 2 mg by mouth 3 (three) times daily.     . Asenapine Maleate (SAPHRIS) 10 MG SUBL Place 20 mg under the tongue at bedtime.    Marland Kitchen. aspirin EC 325 MG EC tablet Take 1 tablet (325 mg total) by mouth daily. 30 tablet 0  . atenolol (TENORMIN) 100 MG tablet Take 1 tablet (100 mg total) by mouth at bedtime.    . cyclobenzaprine (FLEXERIL) 10 MG tablet Take 10 mg by mouth 2 (two) times daily.    Marland Kitchen. dicyclomine (BENTYL) 10 MG capsule Take 10 mg by mouth 3 (three) times daily before meals.     . fesoterodine (TOVIAZ) 8 MG TB24 tablet Take 8 mg by mouth every evening.    Marland Kitchen. FLUoxetine (PROZAC) 40 MG capsule Take 40 mg by mouth 3 (three) times daily.     . furosemide (LASIX) 20 MG tablet Take 20 mg by mouth every morning.     . gabapentin (NEURONTIN) 400 MG capsule Take 400 mg by mouth 4 (four) times daily.    Marland Kitchen. MYRBETRIQ 25 MG TB24 tablet Take 25 mg by mouth every evening.     .  naproxen (NAPROSYN) 500 MG tablet Take 1 tablet (500 mg total) by mouth 2 (two) times daily as needed for mild pain or moderate pain.    Marland Kitchen. omeprazole (PRILOSEC) 20 MG capsule Take 40 mg by mouth daily.     Marland Kitchen. oxyCODONE-acetaminophen (PERCOCET) 10-325 MG tablet Take 1 tablet by mouth 3 (three) times daily. Scheduled 7a, 12p, 5p    . simvastatin (ZOCOR) 20 MG tablet Take 2 tablets (40 mg total) by mouth at bedtime. 60 tablet 0   No current facility-administered medications on file prior to visit.     Allergies:   Allergies  Allergen Reactions  . Ampicillin Hives and Nausea And Vomiting    Has patient had a PCN reaction causing immediate rash, facial/tongue/throat swelling, SOB or lightheadedness with hypotension: No Has patient had a PCN reaction causing severe rash involving mucus membranes or skin necrosis: No Has patient had a PCN reaction that required hospitalization No Has patient had a PCN reaction occurring within the last 10 years: No If all of the above answers are "NO", then may proceed with Cephalosporin use.   Marland Kitchen Cleocin [Clindamycin Hcl]     "deathly sick"  . Clonazepam Other (See Comments)    Pt does not remember what the reaction was  . Other Other (See Comments)    Allergy to mold, down and feathers per allergy test  . Penicillins Other (See Comments)    Has patient had a PCN reaction causing immediate rash, facial/tongue/throat swelling, SOB or lightheadedness with hypotension: No Has patient had a PCN reaction causing severe rash involving mucus membranes or skin necrosis: No Has patient had a PCN reaction that required hospitalization No Has patient had a PCN reaction occurring within the last 10 years: No If all of the above answers are "NO", then may proceed with Cephalosporin use.   Pt told not to take because of reaction to ampicillin - no  . Bupropion Hcl Itching and Anxiety    Physical Exam General: well developed, well nourished, seated, in no evident  distress Head: head normocephalic and atraumatic.  Neck: supple with no carotid or supraclavicular bruits Cardiovascular: regular rate and rhythm, no murmurs Musculoskeletal: no deformity Skin:  no rash/petichiae Vascular:  Normal pulses all extremities Vitals:   11/06/16 1433  BP: 130/62  Pulse: 72   Neurologic Exam Mental Status: Awake and fully alert. Oriented to place and time. Recent and remote memory intact. Attention span, concentration and fund of knowledge appropriate. Mood and affect appropriate. Speech occasional slurred words and some word hesitancy but mostly fluent. No aphasia Cranial Nerves: Fundoscopic exam reveals sharp disc margins. Pupils equal, briskly reactive to light. Extraocular movements full without nystagmus. Visual fields full to confrontation. Hearing intact. Facial sensation intact. Face, tongue, palate moves normally and symmetrically.  Motor: Normal bulk and tone. Normal strength in all tested extremity muscles. Sensory.: intact to touch ,pinprick .position and vibratory sensation.  Coordination: Rapid alternating movements normal in all extremities. Finger-to-nose and heel-to-shin performed accurately bilaterally. Gait and Station: Arises from chair without difficulty. Stance is normal. Gait demonstrates normal stride length and balance . Not able to heel, toe and tandem walk    Reflexes: 1+ and symmetric. Toes downgoing.   NIHSS  1 Modified Rankin  2   ASSESSMENT: 80 year Caucasian lady with the cryptogenic left frontal cortical infarct in March 2018 with vascular risk factors of   obesity and hyperlipidemia and patent foraminal ovale    PLAN: I had a long d/w patient about her recent cryptogenic stroke,PFO,isk for recurrent stroke/TIAs, personally independently reviewed imaging studies and stroke evaluation results and answered questions.Continue aspirin 81 mg daily  for secondary stroke prevention and maintain strict control of hypertension with  blood pressure goal below 130/90, diabetes with hemoglobin A1c goal below 6.5% and lipids with LDL cholesterol goal below 70 mg/dL. I also advised the patient to eat a healthy diet with plenty of whole grains, cereals, fruits and vegetables, exercise regularly and maintain ideal body weight .She has a ROPE score of 6 points which gives her a 62% chance that the stroke is due to PFO  with a 8% risk of stroke recurrence over 2 years. The patient does not want to consider endovascular PFO closure at the present time and would like to try medical therapy first. She will call me if she changes her mind Followup in the future with my nurse practitioner in 6 months or call earlier if necessary. Greater than 50% of time during this 25 minute visit was spent on counseling,explanation of diagnosis, planning of further management, discussion with patient and family and coordination of care Delia HeadyPramod Jamila Slatten, MD  [facility] [address] [place], [place] [id number]  Phone [phone] Fax [phone] Note: This document was prepared with digital dictation and possible smart phrase technology. Any transcriptional errors that result from this process are unintentional

## 2016-11-07 ENCOUNTER — Encounter: Payer: Self-pay | Admitting: Cardiology

## 2016-11-08 LAB — CUP PACEART REMOTE DEVICE CHECK
Date Time Interrogation Session: 20180720220845
MDC IDC PG IMPLANT DT: 20180322

## 2016-11-08 NOTE — Progress Notes (Signed)
Carelink summary report received. Battery status OK. Normal device function. No new symptom episodes, tachy episodes, brady, or pause episodes. No new AF episodes. Monthly summary reports and ROV/PRN 

## 2016-11-24 ENCOUNTER — Ambulatory Visit (INDEPENDENT_AMBULATORY_CARE_PROVIDER_SITE_OTHER): Payer: Medicare Other | Admitting: *Deleted

## 2016-11-24 DIAGNOSIS — I639 Cerebral infarction, unspecified: Secondary | ICD-10-CM | POA: Diagnosis not present

## 2016-11-26 NOTE — Progress Notes (Signed)
Carelink Summary Report / Loop Recorder 

## 2016-11-27 ENCOUNTER — Emergency Department (HOSPITAL_COMMUNITY): Payer: Medicare Other

## 2016-11-27 ENCOUNTER — Encounter (HOSPITAL_COMMUNITY): Payer: Self-pay | Admitting: Emergency Medicine

## 2016-11-27 ENCOUNTER — Emergency Department (HOSPITAL_COMMUNITY)
Admission: EM | Admit: 2016-11-27 | Discharge: 2016-11-27 | Disposition: A | Discharge status: Home / Self Care | Payer: Medicare Other | Attending: Emergency Medicine | Admitting: Emergency Medicine

## 2016-11-27 DIAGNOSIS — I10 Essential (primary) hypertension: Secondary | ICD-10-CM | POA: Insufficient documentation

## 2016-11-27 DIAGNOSIS — Z8673 Personal history of transient ischemic attack (TIA), and cerebral infarction without residual deficits: Secondary | ICD-10-CM | POA: Diagnosis not present

## 2016-11-27 DIAGNOSIS — Z79899 Other long term (current) drug therapy: Secondary | ICD-10-CM | POA: Diagnosis not present

## 2016-11-27 DIAGNOSIS — J449 Chronic obstructive pulmonary disease, unspecified: Secondary | ICD-10-CM | POA: Diagnosis not present

## 2016-11-27 DIAGNOSIS — F17210 Nicotine dependence, cigarettes, uncomplicated: Secondary | ICD-10-CM | POA: Insufficient documentation

## 2016-11-27 DIAGNOSIS — Y92009 Unspecified place in unspecified non-institutional (private) residence as the place of occurrence of the external cause: Secondary | ICD-10-CM

## 2016-11-27 DIAGNOSIS — R531 Weakness: Secondary | ICD-10-CM | POA: Diagnosis present

## 2016-11-27 DIAGNOSIS — J45909 Unspecified asthma, uncomplicated: Secondary | ICD-10-CM | POA: Diagnosis not present

## 2016-11-27 DIAGNOSIS — W19XXXA Unspecified fall, initial encounter: Secondary | ICD-10-CM

## 2016-11-27 LAB — CBC WITH DIFFERENTIAL/PLATELET
BASOS ABS: 0 10*3/uL (ref 0.0–0.1)
BASOS PCT: 0 %
EOS PCT: 3 %
Eosinophils Absolute: 0.2 10*3/uL (ref 0.0–0.7)
HCT: 37.8 % (ref 36.0–46.0)
Hemoglobin: 11.7 g/dL — ABNORMAL LOW (ref 12.0–15.0)
LYMPHS PCT: 31 %
Lymphs Abs: 2.8 10*3/uL (ref 0.7–4.0)
MCH: 25.8 pg — ABNORMAL LOW (ref 26.0–34.0)
MCHC: 31 g/dL (ref 30.0–36.0)
MCV: 83.4 fL (ref 78.0–100.0)
MONO ABS: 0.5 10*3/uL (ref 0.1–1.0)
Monocytes Relative: 6 %
NEUTROS ABS: 5.6 10*3/uL (ref 1.7–7.7)
Neutrophils Relative %: 60 %
PLATELETS: 235 10*3/uL (ref 150–400)
RBC: 4.53 MIL/uL (ref 3.87–5.11)
RDW: 16.1 % — AB (ref 11.5–15.5)
WBC: 9.2 10*3/uL (ref 4.0–10.5)

## 2016-11-27 LAB — ETHANOL

## 2016-11-27 LAB — RAPID URINE DRUG SCREEN, HOSP PERFORMED
Amphetamines: NOT DETECTED
Barbiturates: NOT DETECTED
Benzodiazepines: POSITIVE — AB
COCAINE: NOT DETECTED
OPIATES: NOT DETECTED
TETRAHYDROCANNABINOL: NOT DETECTED

## 2016-11-27 LAB — BASIC METABOLIC PANEL
Anion gap: 7 (ref 5–15)
BUN: 14 mg/dL (ref 6–20)
CALCIUM: 9.4 mg/dL (ref 8.9–10.3)
CO2: 30 mmol/L (ref 22–32)
Chloride: 104 mmol/L (ref 101–111)
Creatinine, Ser: 1.05 mg/dL — ABNORMAL HIGH (ref 0.44–1.00)
GFR, EST NON AFRICAN AMERICAN: 58 mL/min — AB (ref >60)
Glucose, Bld: 99 mg/dL (ref 65–99)
POTASSIUM: 3.7 mmol/L (ref 3.5–5.1)
SODIUM: 141 mmol/L (ref 135–145)

## 2016-11-27 LAB — CK: Total CK: 166 U/L (ref 38–234)

## 2016-11-27 NOTE — ED Notes (Signed)
Discussed need for urine specimen with patient. Patient reports she does not feel the need at this time, but will try after finishing cup of soda.

## 2016-11-27 NOTE — ED Notes (Signed)
Pt departed in NAD.  

## 2016-11-27 NOTE — Discharge Instructions (Signed)
All the results in the ER are normal, labs and imaging. We are not sure what is causing your symptoms. The workup in the ER is not complete, and is limited to screening for life threatening and emergent conditions only, so please see a primary care doctor for further evaluation.  

## 2016-11-27 NOTE — ED Notes (Signed)
Patient transported to CT 

## 2016-11-27 NOTE — ED Triage Notes (Signed)
Patient arrived to ED from home via [facility]. EMS reports:  Patient reports she believes she may have fallen. Stated to EMS that she has had recent falls over the past few days, and awoke lying on the floor approx 1100. Also, reports weakness. Denies nausea, vomiting, diarrhea, SHOB.  18 gauge in L AC.  BP 16/76, Pulse 70, Resp 18, 91% on room air. 9% on 2 LPM via nasal cannula. CBG 118. Hx - CVA 3 months ago.  Neuro intact.

## 2016-11-27 NOTE — ED Provider Notes (Signed)
  Physical Exam  BP 131/83   Pulse 66   Temp 98.3 F (36.8 C) (Oral)   Resp 19   Ht 5\' 7"  (1.702 m)   Wt 122.5 kg (270 lb)   LMP  (LMP Unknown)   SpO2 99%   BMI 42.29 kg/m   Physical Exam  ED Course  Procedures  MDM  Assuming care of patient from Dr. Clarene Duke   Patient in the ED for fall, dizziness. There is no syncope/ seizures. Pt has been drinking some energy drinks. Pt is on several meds that might be contributing. Workup thus far shows normal CBC and CT head.   Concerning findings are as following none Important pending results are BMP, ethanol and CK.  According to Dr. Clarene Duke, plan is to d/c if the labs look fine, and advise close f/u with pcp for medication reconciliation and neurology for falls.   Patient had no complains, no concerns from the nursing side. Will continue to monitor.        Derwood Kaplan, MD 11/27/16 [phone]

## 2016-11-27 NOTE — ED Provider Notes (Signed)
[facility]-EMERGENCY DEPT Provider Note   CSN: [id number] Arrival date & time: 11/27/16  1352     History   Chief Complaint Chief Complaint  Patient presents with  . Fall  . Weakness    HPI Tammy Hines is a 56 y.o. female.  Patient is a 56yo F with PMH significant for recent cryptogenic stroke in march 2018, bipolar disorder, COPD, HTN, HLD who presents to ED after an unwitnessed fall at home. Patient states the last thing she remembers before the fall was going to bed around 6am this morning, woke up around 1pm on the floor between her bedroom and kitchen, thinks she fell in her sleep. States has had falls over the last year, worsening recently, has had 6+ falls in the last few days. States that many of these happen during her sleep, she was told that she tries to enact out her dreams and will frequently wake up trying to perform these actions. Spoke with her psychiatrist who recommended decreasing her gabapentin dose, which she has not found to be helpful. States she was down for unknown amount of time, has L hip pain but no HA, vision changes or focal weakness. She does endorse feeling some generalized weakness. No chest pain or SOB.      Past Medical History:  Diagnosis Date  . Anxiety   . Arthritis    "right knee, left hip, going down my neck into my right shoulder" (06/24/2016)  . Asthma   . Bipolar 1 disorder (HCC)   . Chronic lower back pain   . Chronic neck pain   . COPD (chronic obstructive pulmonary disease) (HCC)   . Depression    Tammy Hines 06/24/2016  . Family history of adverse reaction to anesthesia    "makes my mom throw up"   . Fibromyalgia   . GERD (gastroesophageal reflux disease)    Tammy Hines 06/24/2016  . Hepatitis B   . History of hiatal hernia   . History of stomach ulcers   . History of transient ischemic attack (TIA)    /RN 06/24/2016  . Hyperlipidemia   . Hypertension   . Ischemic stroke (HCC)    acute/notes 06/24/2016; "left sided weakness"/RN  (06/24/2016  . Migraine    "a few/year now" (06/24/2016)  . Obesity   . PONV (postoperative nausea and vomiting)     Patient Active Problem List   Diagnosis Date Noted  . Stroke (cerebrum) (HCC)   . Acute ischemic stroke (HCC) 06/24/2016  . Bipolar disorder (HCC) 06/24/2016  . Hypertension 06/24/2016  . Hyperlipidemia 06/24/2016  . Anxiety 06/24/2016  . Facial droop   . GERD without esophagitis   . SORE THROAT 12/04/2008  . VIRAL URI 03/04/2008  . BLURRED VISION 10/22/2007  . FATIGUE 10/22/2007  . DISC DISEASE, LUMBAR 09/06/2007  . COUGH 04/20/2007  . Depression 11/19/2006  . Asthma 11/19/2006  . GERD 11/19/2006  . HEADACHE 11/19/2006  . HEPATITIS B, HX OF 11/19/2006    Past Surgical History:  Procedure Laterality Date  . ANAL FISSURE REPAIR    . FRACTURE SURGERY    . LOOP RECORDER INSERTION N/A 06/26/2016   Procedure: Loop Recorder Insertion;  Surgeon: Hillis Range, MD;  Location: [facility];  Service: Cardiovascular;  Laterality: N/A;  . MULTIPLE TOOTH EXTRACTIONS  2018   "I had 6 teeth pulled"  . SHOULDER ARTHROSCOPY W/ ROTATOR CUFF REPAIR Right 2000  . SHOULDER SURGERY Right 1999  . TEE WITHOUT CARDIOVERSION N/A 06/26/2016   Procedure: TRANSESOPHAGEAL  ECHOCARDIOGRAM (TEE);  Surgeon: Thurmon Fair, MD;  Location: [facility] ENDOSCOPY;  Service: Cardiovascular;  Laterality: N/A;    OB History    No data available       Home Medications    Prior to Admission medications   Medication Sig Start Date End Date Taking? Authorizing Provider  albuterol (PROVENTIL HFA;VENTOLIN HFA) 108 (90 Base) MCG/ACT inhaler Inhale 2 puffs into the lungs every 6 (six) hours as needed for wheezing or shortness of breath.    [provider]  alprazolam Prudy Feeler) 2 MG tablet Take 2 mg by mouth 3 (three) times daily.     [provider]  Asenapine Maleate (SAPHRIS) 10 MG SUBL Place 20 mg under the tongue at bedtime.    [provider]  aspirin EC 325 MG EC tablet  Take 1 tablet (325 mg total) by mouth daily. 06/28/16   Hongalgi, Maximino Greenland, MD  atenolol (TENORMIN) 100 MG tablet Take 1 tablet (100 mg total) by mouth at bedtime. 06/30/16   Hongalgi, Maximino Greenland, MD  buPROPion (WELLBUTRIN XL) 150 MG 24 hr tablet Take 300 mg by mouth daily.  10/20/16   [provider]  CREON 36000 units CPEP capsule  10/20/16   [provider]  cyclobenzaprine (FLEXERIL) 10 MG tablet Take 10 mg by mouth 2 (two) times daily.    [provider]  dicyclomine (BENTYL) 10 MG capsule Take 10 mg by mouth 3 (three) times daily before meals.     [provider]  fesoterodine (TOVIAZ) 8 MG TB24 tablet Take 8 mg by mouth every evening.    [provider]  FLUoxetine (PROZAC) 40 MG capsule Take 40 mg by mouth 3 (three) times daily.     [provider]  furosemide (LASIX) 20 MG tablet Take 20 mg by mouth every morning.     [provider]  gabapentin (NEURONTIN) 400 MG capsule Take 400 mg by mouth 4 (four) times daily. 05/29/16   [provider]  MYRBETRIQ 25 MG TB24 tablet Take 25 mg by mouth every evening.  03/27/16   [provider]  naproxen (NAPROSYN) 500 MG tablet Take 1 tablet (500 mg total) by mouth 2 (two) times daily as needed for mild pain or moderate pain. 06/27/16   Hongalgi, Maximino Greenland, MD  omeprazole (PRILOSEC) 20 MG capsule Take 40 mg by mouth daily.     [provider]  oxybutynin (DITROPAN XL) 15 MG 24 hr tablet  10/20/16   [provider]  oxyCODONE-acetaminophen (PERCOCET) 10-325 MG tablet Take 1 tablet by mouth 3 (three) times daily. Scheduled 7a, 12p, 5p 12/13/15   [provider]  pantoprazole (PROTONIX) 40 MG tablet  10/20/16   [provider]  simvastatin (ZOCOR) 20 MG tablet Take 2 tablets (40 mg total) by mouth at bedtime. 06/27/16   Tammy Etienne, MD  simvastatin (ZOCOR) 40 MG tablet  10/20/16   [provider]    Family History Family History  Problem  Relation Age of Onset  . Hypertension Mother   . Cancer Father        esophageal  . Heart attack Maternal Grandfather     Social History Social History  Substance Use Topics  . Smoking status: Current Every Day Smoker    Packs/day: 0.25    Years: 25.00    Types: Cigarettes  . Smokeless tobacco: Never Used  . Alcohol use 0.6 oz/week    1 Glasses of wine per week  Comment: 06/24/2016 "I'll have a drink once/year"     Allergies   Ampicillin; Cleocin [clindamycin hcl]; Clonazepam; Other; Penicillins; and Bupropion hcl   Review of Systems Review of Systems  Constitutional: Negative for chills and fever.  Eyes: Negative for visual disturbance.  Respiratory: Negative for shortness of breath and wheezing.   Cardiovascular: Negative for chest pain and palpitations.  Gastrointestinal: Negative for abdominal pain, constipation, diarrhea, nausea and vomiting.  Musculoskeletal: Positive for arthralgias (L hip pain). Negative for myalgias.  Skin: Negative for wound.  Neurological: Positive for weakness (generalized). Negative for headaches.     Physical Exam Updated Vital Signs BP (!) 135/93 (BP Location: Left Arm)   Pulse 70   Temp 98.3 F (36.8 C) (Oral)   Resp 19   Ht 5\' 7"  (1.702 m)   Wt 122.5 kg (270 lb)   LMP  (LMP Unknown)   SpO2 96%   BMI 42.29 kg/m   Physical Exam  Constitutional: She is oriented to person, place, and time. She appears well-developed and well-nourished. No distress.  HENT:  Head: Normocephalic and atraumatic.  Nose: Nose normal.  Mouth/Throat: Oropharynx is clear and moist.  Eyes: Pupils are equal, round, and reactive to light. Conjunctivae and EOM are normal.  Neck: Normal range of motion. Neck supple.  Cardiovascular: Normal rate, regular rhythm, normal heart sounds and intact distal pulses.   No murmur heard. Pulmonary/Chest: Effort normal and breath sounds normal. No respiratory distress. She has no wheezes.  Abdominal: Soft. Bowel  sounds are normal. She exhibits no distension. There is no tenderness. There is no rebound and no guarding.  Musculoskeletal: Normal range of motion. She exhibits tenderness (mild TTP at L hip).  Lymphadenopathy:    She has no cervical adenopathy.  Neurological: She is alert and oriented to person, place, and time. She displays normal reflexes. No cranial nerve deficit or sensory deficit. She exhibits normal muscle tone. Coordination normal.  Intact finger to nose  Skin: Skin is warm and dry. Capillary refill takes less than 2 seconds.  Psychiatric: She has a normal mood and affect.     ED Treatments / Results  Labs (all labs ordered are listed, but only abnormal results are displayed) Labs Reviewed - No data to display  EKG  EKG Interpretation  Date/Time:  Thursday November 27 2016 13:57:23 EDT Ventricular Rate:  70 PR Interval:  172 QRS Duration: 108 QT Interval:  442 QTC Calculation: 477 R Axis:   -24 Text Interpretation:  Normal sinus rhythm Low voltage QRS Borderline ECG non specific T wave flattening similar to previous Confirmed by Frederick Peers [phone]) on 11/27/2016 2:11:12 PM       Radiology Dg Pelvis 1-2 Views  Result Date: 11/27/2016 CLINICAL DATA:  Patient fell and landed on back. Multiple recent falls. EXAM: PELVIS - 1-2 VIEW COMPARISON:  None. FINDINGS: There is no evidence of pelvic fracture or diastasis. No pelvic bone lesions are seen. Moderate-sized panniculus. Joint space narrowing is moderately severe in the LEFT hip. IMPRESSION: No acute osseous findings are evident. Electronically Signed   By: Elsie Stain M.D.   On: 11/27/2016 15:39    Procedures Procedures (including critical care time)  Medications Ordered in ED Medications - No data to display   Initial Impression / Assessment and Plan / ED Course  I have reviewed the triage vital signs and the nursing notes.  Pertinent labs & imaging results that were available during my care of the patient  were reviewed by me and  considered in my medical decision making (see chart for details).   Patient is a 56yo F with recent cryptogenic stroke in March 2018, bipolar disorder, HTN, HLD, COPD who presents after an unwitnessed fall at home. Of note, EMS placed patient on 2L O2 via nasal cannula en route for desat to 91% however patient is wearing black fingernail polish and on recheck had O2 saturation to 97% on room air.  Given fall and down for unknown amount of time, obtained CT head and Xray of L hip. Labs were ordered including CBC, BMP, and CK. Signed out to Dr. Rhunette Croft to follow up on imaging and lab results.  Final Clinical Impressions(s) / ED Diagnoses   Final diagnoses:  None    New Prescriptions New Prescriptions   No medications on file     Leland Her, DO 11/27/16 1604    Little, Ambrose Finland, MD 11/27/16 [phone]

## 2016-11-27 NOTE — ED Notes (Signed)
RN supervised patient ambulating to restroom. Gait slow, steady. Patient attempting to urinate for UDS.

## 2016-11-28 ENCOUNTER — Emergency Department (HOSPITAL_COMMUNITY): Payer: Medicare Other

## 2016-11-28 ENCOUNTER — Emergency Department (HOSPITAL_COMMUNITY)
Admission: EM | Admit: 2016-11-28 | Discharge: 2016-11-29 | Disposition: A | Discharge status: Other Acute Inpatient Hospital | Payer: Medicare Other | Attending: Physician Assistant | Admitting: Physician Assistant

## 2016-11-28 ENCOUNTER — Encounter (HOSPITAL_COMMUNITY): Payer: Self-pay | Admitting: Emergency Medicine

## 2016-11-28 ENCOUNTER — Emergency Department (HOSPITAL_COMMUNITY)
Admission: EM | Admit: 2016-11-28 | Discharge: 2016-11-28 | Discharge status: Against Medical Advice | Payer: Medicare Other | Source: Home / Self Care | Attending: Emergency Medicine | Admitting: Emergency Medicine

## 2016-11-28 DIAGNOSIS — Z5321 Procedure and treatment not carried out due to patient leaving prior to being seen by health care provider: Secondary | ICD-10-CM

## 2016-11-28 DIAGNOSIS — Z880 Allergy status to penicillin: Secondary | ICD-10-CM | POA: Insufficient documentation

## 2016-11-28 DIAGNOSIS — G9340 Encephalopathy, unspecified: Secondary | ICD-10-CM | POA: Diagnosis not present

## 2016-11-28 DIAGNOSIS — I10 Essential (primary) hypertension: Secondary | ICD-10-CM | POA: Insufficient documentation

## 2016-11-28 DIAGNOSIS — R55 Syncope and collapse: Secondary | ICD-10-CM | POA: Diagnosis present

## 2016-11-28 DIAGNOSIS — M545 Low back pain: Secondary | ICD-10-CM | POA: Insufficient documentation

## 2016-11-28 DIAGNOSIS — F17210 Nicotine dependence, cigarettes, uncomplicated: Secondary | ICD-10-CM | POA: Diagnosis not present

## 2016-11-28 DIAGNOSIS — J449 Chronic obstructive pulmonary disease, unspecified: Secondary | ICD-10-CM | POA: Diagnosis not present

## 2016-11-28 DIAGNOSIS — J45909 Unspecified asthma, uncomplicated: Secondary | ICD-10-CM | POA: Diagnosis not present

## 2016-11-28 DIAGNOSIS — Z79899 Other long term (current) drug therapy: Secondary | ICD-10-CM | POA: Insufficient documentation

## 2016-11-28 DIAGNOSIS — R45851 Suicidal ideations: Secondary | ICD-10-CM | POA: Diagnosis not present

## 2016-11-28 LAB — I-STAT ARTERIAL BLOOD GAS, ED
ACID-BASE EXCESS: 2 mmol/L (ref 0.0–2.0)
Acid-Base Excess: 3 mmol/L — ABNORMAL HIGH (ref 0.0–2.0)
BICARBONATE: 28.1 mmol/L — AB (ref 20.0–28.0)
Bicarbonate: 27.4 mmol/L (ref 20.0–28.0)
O2 SAT: 91 %
O2 Saturation: 96 %
PCO2 ART: 43.6 mmHg (ref 32.0–48.0)
PO2 ART: 58 mmHg — AB (ref 83.0–108.0)
Patient temperature: 97.5
TCO2: 29 mmol/L (ref 22–32)
TCO2: 29 mmol/L (ref 22–32)
pCO2 arterial: 44.7 mmHg (ref 32.0–48.0)
pH, Arterial: 7.415 (ref 7.350–7.450)
pH, Arterial: 7.394 (ref 7.350–7.450)
pO2, Arterial: 83 mmHg (ref 83.0–108.0)

## 2016-11-28 LAB — COMPREHENSIVE METABOLIC PANEL
ALT: 31 U/L (ref 14–54)
AST: 32 U/L (ref 15–41)
Albumin: 3.6 g/dL (ref 3.5–5.0)
Alkaline Phosphatase: 88 U/L (ref 38–126)
Anion gap: 10 (ref 5–15)
BUN: 16 mg/dL (ref 6–20)
CHLORIDE: 104 mmol/L (ref 101–111)
CO2: 26 mmol/L (ref 22–32)
CREATININE: 1.16 mg/dL — AB (ref 0.44–1.00)
Calcium: 9.1 mg/dL (ref 8.9–10.3)
GFR calc non Af Amer: 52 mL/min — ABNORMAL LOW (ref >60)
GFR, EST AFRICAN AMERICAN: 60 mL/min — AB (ref >60)
Glucose, Bld: 88 mg/dL (ref 65–99)
Potassium: 3.8 mmol/L (ref 3.5–5.1)
SODIUM: 140 mmol/L (ref 135–145)
Total Bilirubin: 0.3 mg/dL (ref 0.3–1.2)
Total Protein: 6.8 g/dL (ref 6.5–8.1)

## 2016-11-28 LAB — CBC WITH DIFFERENTIAL/PLATELET
Basophils Absolute: 0 10*3/uL (ref 0.0–0.1)
Basophils Relative: 1 %
EOS ABS: 0.3 10*3/uL (ref 0.0–0.7)
Eosinophils Relative: 3 %
HEMATOCRIT: 36 % (ref 36.0–46.0)
HEMOGLOBIN: 10.8 g/dL — AB (ref 12.0–15.0)
LYMPHS ABS: 2.7 10*3/uL (ref 0.7–4.0)
Lymphocytes Relative: 32 %
MCH: 25.8 pg — AB (ref 26.0–34.0)
MCHC: 30 g/dL (ref 30.0–36.0)
MCV: 85.9 fL (ref 78.0–100.0)
MONOS PCT: 8 %
Monocytes Absolute: 0.7 10*3/uL (ref 0.1–1.0)
NEUTROS PCT: 56 %
Neutro Abs: 4.7 10*3/uL (ref 1.7–7.7)
Platelets: 231 10*3/uL (ref 150–400)
RBC: 4.19 MIL/uL (ref 3.87–5.11)
RDW: 16.7 % — ABNORMAL HIGH (ref 11.5–15.5)
WBC: 8.4 10*3/uL (ref 4.0–10.5)

## 2016-11-28 LAB — ETHANOL

## 2016-11-28 LAB — ACETAMINOPHEN LEVEL

## 2016-11-28 MED ORDER — IPRATROPIUM-ALBUTEROL 0.5-2.5 (3) MG/3ML IN SOLN
3.0000 mL | Freq: Once | RESPIRATORY_TRACT | Status: AC
  Administered 2016-11-28: 3 mL via RESPIRATORY_TRACT
  Filled 2016-11-28: qty 3

## 2016-11-28 MED ORDER — NALOXONE HCL 0.4 MG/ML IJ SOLN
0.4000 mg | Freq: Once | INTRAMUSCULAR | Status: AC
  Administered 2016-11-28: 0.4 mg via INTRAVENOUS
  Filled 2016-11-28: qty 1

## 2016-11-28 MED ORDER — AMMONIA AROMATIC IN INHA
0.3000 mL | RESPIRATORY_TRACT | Status: DC | PRN
  Filled 2016-11-28 (×2): qty 10

## 2016-11-28 NOTE — ED Notes (Signed)
No answer when called for reassessment x 2 attempts

## 2016-11-28 NOTE — ED Notes (Signed)
Pt sleeping- and not arousing well to sternal run when MD entered room. MD ordered 0.4 narcan to be given. Pt did not have impressive response. But will answer questions. Very lethargic

## 2016-11-28 NOTE — ED Notes (Signed)
RN called RT for new ABG order

## 2016-11-28 NOTE — ED Notes (Signed)
Called pt for bed with no answer 

## 2016-11-28 NOTE — ED Notes (Signed)
Pt. Un-arousable when spoken to. Painful stimuli applied and Pt. Stated " I want you to leave me the f* alone". Admitting doctor continued to ask her where she is and Pt. Was unable to answer question. Pt. Stated she just wanted to sleep. Pt. Fell back asleep and painful stimuli was applied again. Pt. Began to cooperate but had difficulty opening her eyes. Pt. States she wanted her gum and we were able to get her to ambulate to her belongings across the room. Gait was very unsteady. Pt. Was returned to bed and promptly fell asleep. Gum was removed from Pt's mouth.

## 2016-11-28 NOTE — ED Triage Notes (Signed)
Pt from home. Pt has been trying to break up with her for the past several days, pt has had a syncopal episode every time he says he is leaving. Pt states she doesn't remember anything, except she wants to take her percocet. Syncopal episode witnessed by boyfriend. No seizure like activity, no fall. Pt alert and oriented. Pt lethargic. CBG 115, HR 80, 100% on room air, BP 126/68

## 2016-11-28 NOTE — ED Notes (Signed)
Called pt x2, no answer. Notified Wendy(RN)

## 2016-11-28 NOTE — Consult Note (Signed)
Medical Consultation   Tammy Hines  ZOX:[id number]  DOB: [birth year]  DOA: 11/28/2016  PCP: Quitman Livings, MD    Requesting physician: Dr. Rhunette Croft  Reason for consultation: AMS   History of Present Illness: Tammy Hines is an 56 y.o. female with h/o cryptogenic stroke in March.  Chronic pain, depression, BPD.  Patient has been having recurrent syncopal episodes per her boyfriend the past couple of days when ever he brings up trying to break up with her.  She is brought to ED by boyfriend.  She denies taking any extra meds.  She has become increasingly lethargic during time in ED.   Review of Systems:  ROS Unable to perform as patient doesn't seem to want to participate in history, exam, or much of anything else.   Past Medical History: Past Medical History:  Diagnosis Date  . Anxiety   . Arthritis    "right knee, left hip, going down my neck into my right shoulder" (06/24/2016)  . Asthma   . Bipolar 1 disorder (HCC)   . Chronic lower back pain   . Chronic neck pain   . COPD (chronic obstructive pulmonary disease) (HCC)   . Depression    [name] 06/24/2016  . Family history of adverse reaction to anesthesia    "makes my mom throw up"   . Fibromyalgia   . GERD (gastroesophageal reflux disease)    [name] 06/24/2016  . Hepatitis B   . History of hiatal hernia   . History of stomach ulcers   . History of transient ischemic attack (TIA)    /RN 06/24/2016  . Hyperlipidemia   . Hypertension   . Ischemic stroke (HCC)    acute/notes 06/24/2016; "left sided weakness"/RN (06/24/2016  . Migraine    "a few/year now" (06/24/2016)  . Obesity   . PONV (postoperative nausea and vomiting)     Past Surgical History: Past Surgical History:  Procedure Laterality Date  . ANAL FISSURE REPAIR    . FRACTURE SURGERY    . LOOP RECORDER INSERTION N/A 06/26/2016   Procedure: Loop Recorder Insertion;  Surgeon: Hillis Range, MD;  Location: [facility] INVASIVE CV LAB;   Service: Cardiovascular;  Laterality: N/A;  . MULTIPLE TOOTH EXTRACTIONS  2018   "I had 6 teeth pulled"  . SHOULDER ARTHROSCOPY W/ ROTATOR CUFF REPAIR Right 2000  . SHOULDER SURGERY Right 1999  . TEE WITHOUT CARDIOVERSION N/A 06/26/2016   Procedure: TRANSESOPHAGEAL ECHOCARDIOGRAM (TEE);  Surgeon: Thurmon Fair, MD;  Location: [facility] ENDOSCOPY;  Service: Cardiovascular;  Laterality: N/A;     Allergies:   Allergies  Allergen Reactions  . Ampicillin Hives and Nausea And Vomiting    Has patient had a PCN reaction causing immediate rash, facial/tongue/throat swelling, SOB or lightheadedness with hypotension: No Has patient had a PCN reaction causing severe rash involving mucus membranes or skin necrosis: No Has patient had a PCN reaction that required hospitalization No Has patient had a PCN reaction occurring within the last 10 years: No If all of the above answers are "NO", then may proceed with Cephalosporin use.   Marland Kitchen Cleocin [Clindamycin Hcl]     "deathly sick"  . Clonazepam Other (See Comments)    Pt does not remember what the reaction was  . Other Other (See Comments)    Allergy to mold, down and feathers per allergy test  . Penicillins Other (See Comments)    Has patient had a  PCN reaction causing immediate rash, facial/tongue/throat swelling, SOB or lightheadedness with hypotension: No Has patient had a PCN reaction causing severe rash involving mucus membranes or skin necrosis: No Has patient had a PCN reaction that required hospitalization No Has patient had a PCN reaction occurring within the last 10 years: No If all of the above answers are "NO", then may proceed with Cephalosporin use.   Pt told not to take because of reaction to ampicillin - no  . Bupropion Hcl Itching and Anxiety     Social History:  reports that she has been smoking Cigarettes.  She has a 6.25 pack-year smoking history. She has never used smokeless tobacco. She reports that she drinks about 0.6 oz of  alcohol per week . She reports that she does not use drugs.   Family History: Family History  Problem Relation Age of Onset  . Hypertension Mother   . Cancer Father        esophageal  . Heart attack Maternal Grandfather       Physical Exam: Vitals:   11/28/16 2000 11/28/16 2018 11/28/16 2030 11/28/16 2045  BP: 109/70  111/67 107/71  Pulse: 69  66 66  Resp: 13  13 12   Temp:      TempSrc:      SpO2: (!) 89% (S) 91% 95% 95%  Weight:      Height:        Constitutional:   oriented x3, not in any acute distress. Eyes: PERLA, EOMI, irises appear normal, anicteric sclera,  ENMT: external ears and nose appear normal            Lips appears normal, oropharynx mucosa, tongue, posterior pharynx appear normal  Neck: neck appears normal, no masses, normal ROM, no thyromegaly, no JVD  CVS: S1-S2 clear, no murmur rubs or gallops, no LE edema, normal pedal pulses  Respiratory:  clear to auscultation bilaterally, no wheezing, rales or rhonchi. Respiratory effort normal. No accessory muscle use.  Abdomen: soft nontender, nondistended, normal bowel sounds, no hepatosplenomegaly, no hernias  Musculoskeletal: : no cyanosis, clubbing or edema noted bilaterally Neuro: Cranial nerves II-XII intact, strength, sensation, reflexes, MAE, is able to ambulate with assistance, no focal deficits Psych: Patient acts very lethargic, but after waking up a couple of times to noxious stimuli, she wakes up with very minor prompting or warning that we are going to have to repeat noxious stimuli again. Skin: no rashes or lesions or ulcers, no induration or nodules    Data reviewed:  I have personally reviewed following labs and imaging studies Labs:  CBC:  Recent Labs Lab 11/27/16 1615 11/28/16 1813  WBC 9.2 8.4  NEUTROABS 5.6 4.7  HGB 11.7* 10.8*  HCT 37.8 36.0  MCV 83.4 85.9  PLT 235 231    Basic Metabolic Panel:  Recent Labs Lab 11/27/16 1615 11/28/16 1813  NA 141 140  K 3.7 3.8  CL 104  104  CO2 30 26  GLUCOSE 99 88  BUN 14 16  CREATININE 1.05* 1.16*  CALCIUM 9.4 9.1   GFR Estimated Creatinine Clearance: 73.5 mL/min (A) (by C-G formula based on SCr of 1.16 mg/dL (H)). Liver Function Tests:  Recent Labs Lab 11/28/16 1813  AST 32  ALT 31  ALKPHOS 88  BILITOT 0.3  PROT 6.8  ALBUMIN 3.6   No results for input(s): LIPASE, AMYLASE in the last 168 hours. No results for input(s): AMMONIA in the last 168 hours. Coagulation profile No results for input(s): INR, PROTIME in  the last 168 hours.  Cardiac Enzymes:  Recent Labs Lab 11/27/16 1615  CKTOTAL 166   BNP: Invalid input(s): POCBNP CBG: No results for input(s): GLUCAP in the last 168 hours. D-Dimer No results for input(s): DDIMER in the last 72 hours. Hgb A1c No results for input(s): HGBA1C in the last 72 hours. Lipid Profile No results for input(s): CHOL, HDL, LDLCALC, TRIG, CHOLHDL, LDLDIRECT in the last 72 hours. Thyroid function studies No results for input(s): TSH, T4TOTAL, T3FREE, THYROIDAB in the last 72 hours.  Invalid input(s): FREET3 Anemia work up No results for input(s): VITAMINB12, FOLATE, FERRITIN, TIBC, IRON, RETICCTPCT in the last 72 hours. Urinalysis    Component Value Date/Time   COLORURINE YELLOW 06/24/2016 0829   APPEARANCEUR CLEAR 06/24/2016 0829   LABSPEC 1.011 06/24/2016 0829   PHURINE 7.0 06/24/2016 0829   GLUCOSEU NEGATIVE 06/24/2016 0829   HGBUR NEGATIVE 06/24/2016 0829   BILIRUBINUR NEGATIVE 06/24/2016 0829   KETONESUR NEGATIVE 06/24/2016 0829   PROTEINUR NEGATIVE 06/24/2016 0829   NITRITE NEGATIVE 06/24/2016 0829   LEUKOCYTESUR NEGATIVE 06/24/2016 0829     Microbiology No results found for this or any previous visit (from the past 240 hour(s)).     Inpatient Medications:   Scheduled Meds: Continuous Infusions:   Radiological Exams on Admission: Dg Pelvis 1-2 Views  Result Date: 11/27/2016 CLINICAL DATA:  Patient fell and landed on back. Multiple  recent falls. EXAM: PELVIS - 1-2 VIEW COMPARISON:  None. FINDINGS: There is no evidence of pelvic fracture or diastasis. No pelvic bone lesions are seen. Moderate-sized panniculus. Joint space narrowing is moderately severe in the LEFT hip. IMPRESSION: No acute osseous findings are evident. Electronically Signed   By: Elsie Stain M.D.   On: 11/27/2016 15:39   Ct Head Wo Contrast  Result Date: 11/28/2016 CLINICAL DATA:  Altered mental status. EXAM: CT HEAD WITHOUT CONTRAST TECHNIQUE: Contiguous axial images were obtained from the base of the skull through the vertex without intravenous contrast. COMPARISON:  CT head 11/27/2016 and 06/25/2016. FINDINGS: Brain: There is no evidence of acute intracranial hemorrhage, mass lesion, brain edema or extra-axial fluid collection. The ventricles and subarachnoid spaces are appropriately sized for age. There is no CT evidence of acute cortical infarction. Vascular: No evidence of hyperdense vessel. Skull: Negative for fracture or focal lesion. Sinuses/Orbits: The visualized paranasal sinuses and mastoid air cells are clear. No orbital abnormalities are seen. Other: None. IMPRESSION: Stable unremarkable noncontrast head CT. No explanation for the patient's symptoms identified. Electronically Signed   By: Carey Bullocks M.D.   On: 11/28/2016 19:16   Ct Head Wo Contrast  Result Date: 11/27/2016 CLINICAL DATA:  Fall. EXAM: CT HEAD WITHOUT CONTRAST TECHNIQUE: Contiguous axial images were obtained from the base of the skull through the vertex without intravenous contrast. COMPARISON:  CT head dated June 25, 2016. FINDINGS: Brain: No evidence of acute infarction, hemorrhage, hydrocephalus, extra-axial collection or mass lesion/mass effect. Vascular: No hyperdense vessel or unexpected calcification. Skull: Normal. Negative for fracture or focal lesion. Sinuses/Orbits: The bilateral paranasal sinuses and mastoid air cells are clear. The orbits are unremarkable. Other: None.  IMPRESSION: No acute intracranial abnormality. Electronically Signed   By: Obie Dredge M.D.   On: 11/27/2016 16:13   Dg Chest Portable 1 View  Result Date: 11/28/2016 CLINICAL DATA:  Syncopal episode. EXAM: PORTABLE CHEST 1 VIEW COMPARISON:  06/24/2016 FINDINGS: 1844 hours. The cardio pericardial silhouette is enlarged. Vascular congestion with probable interstitial pulmonary edema. No focal airspace consolidation. No substantial pleural effusion.  The visualized bony structures of the thorax are intact. Telemetry leads overlie the chest. IMPRESSION: Low lung volumes with cardiomegaly and probable interstitial pulmonary edema. Electronically Signed   By: Kennith Center M.D.   On: 11/28/2016 19:12    Impression/Recommendations Principal Problem:   Acute encephalopathy  1. Acute encephalopathy - toxic from home meds vs ischemic stroke (pons?) vs psychogenic. 1. Rule out acute ischemia with MRI 2. Exam seems highly suspicious for underlying psychiatric component. 3. Will re-evaluate in a couple of hours in ED and see if this has resolved like it did last evening.  If not, then may require observation.   Thank you for this consultation.  Our [facility] hospitalist team will follow the patient with you.   Time Spent: 80 MIN  GARDNER, JARED M. D.O. [facility] 11/28/2016, 10:57 PM

## 2016-11-28 NOTE — ED Notes (Addendum)
Pt difficult to arouse with verbal stimuli; painful stimuli given with minimal response-- pt grimaces but does not open eyes or speak; Unknown Foley, MD at bedside also aware

## 2016-11-28 NOTE — ED Triage Notes (Signed)
Pt c/o 10/10 right side lower back pain for the past year getting worse today, pt states she had a fall on her living room few days ago and pain is getting worse no urinary symptoms.

## 2016-11-29 MED ORDER — OXYCODONE-ACETAMINOPHEN 10-325 MG PO TABS: 1.0000 | ORAL_TABLET | Freq: Three times a day (TID) | ORAL | Status: DC

## 2016-11-29 MED ORDER — OXYBUTYNIN CHLORIDE ER 15 MG PO TB24
15.0000 mg | ORAL_TABLET | Freq: Every day | ORAL | Status: DC
  Filled 2016-11-29: qty 1

## 2016-11-29 MED ORDER — ALUM & MAG HYDROXIDE-SIMETH 200-200-20 MG/5ML PO SUSP: 30.0000 mL | Freq: Four times a day (QID) | ORAL | Status: DC | PRN

## 2016-11-29 MED ORDER — PANTOPRAZOLE SODIUM 40 MG PO TBEC: 40.0000 mg | DELAYED_RELEASE_TABLET | Freq: Every day | ORAL | Status: DC

## 2016-11-29 MED ORDER — OXYCODONE-ACETAMINOPHEN 5-325 MG PO TABS: 1.0000 | ORAL_TABLET | Freq: Three times a day (TID) | ORAL | Status: DC

## 2016-11-29 MED ORDER — ACETAMINOPHEN 325 MG PO TABS: 650.0000 mg | ORAL_TABLET | ORAL | Status: DC | PRN

## 2016-11-29 MED ORDER — OXYCODONE HCL 5 MG PO TABS: 5.0000 mg | ORAL_TABLET | Freq: Three times a day (TID) | ORAL | Status: DC

## 2016-11-29 MED ORDER — OXYCODONE HCL 5 MG PO TABS
5.0000 mg | ORAL_TABLET | Freq: Three times a day (TID) | ORAL | Status: DC
  Administered 2016-11-29 (×2): 5 mg via ORAL
  Filled 2016-11-29 (×2): qty 1

## 2016-11-29 MED ORDER — ALPRAZOLAM 0.5 MG PO TABS
2.0000 mg | ORAL_TABLET | Freq: Three times a day (TID) | ORAL | Status: DC
  Administered 2016-11-29 (×2): 2 mg via ORAL
  Filled 2016-11-29 (×2): qty 4

## 2016-11-29 MED ORDER — ONDANSETRON HCL 4 MG PO TABS: 4.0000 mg | ORAL_TABLET | Freq: Three times a day (TID) | ORAL | Status: DC | PRN

## 2016-11-29 MED ORDER — SIMVASTATIN 40 MG PO TABS
40.0000 mg | ORAL_TABLET | Freq: Every day | ORAL | Status: DC
  Administered 2016-11-29: 40 mg via ORAL
  Filled 2016-11-29: qty 1

## 2016-11-29 MED ORDER — FESOTERODINE FUMARATE ER 8 MG PO TB24
8.0000 mg | ORAL_TABLET | Freq: Every evening | ORAL | Status: DC
  Filled 2016-11-29: qty 1

## 2016-11-29 MED ORDER — FLUOXETINE HCL 20 MG PO CAPS
40.0000 mg | ORAL_CAPSULE | Freq: Three times a day (TID) | ORAL | Status: DC
  Administered 2016-11-29 (×2): 40 mg via ORAL
  Filled 2016-11-29 (×3): qty 2

## 2016-11-29 MED ORDER — OXYCODONE-ACETAMINOPHEN 5-325 MG PO TABS
1.0000 | ORAL_TABLET | Freq: Three times a day (TID) | ORAL | Status: DC
  Administered 2016-11-29 (×2): 1 via ORAL
  Filled 2016-11-29 (×2): qty 1

## 2016-11-29 MED ORDER — DICYCLOMINE HCL 10 MG PO CAPS
10.0000 mg | ORAL_CAPSULE | Freq: Three times a day (TID) | ORAL | Status: DC
  Administered 2016-11-29 (×3): 10 mg via ORAL
  Filled 2016-11-29 (×3): qty 1

## 2016-11-29 MED ORDER — BUPROPION HCL ER (XL) 150 MG PO TB24
450.0000 mg | ORAL_TABLET | Freq: Every day | ORAL | Status: DC
  Administered 2016-11-29: 450 mg via ORAL
  Filled 2016-11-29: qty 3

## 2016-11-29 MED ORDER — PANCRELIPASE (LIP-PROT-AMYL) 36000-114000 UNITS PO CPEP
36000.0000 [IU] | ORAL_CAPSULE | Freq: Three times a day (TID) | ORAL | Status: DC
Start: 2016-11-29 — End: 2016-11-29
  Administered 2016-11-29 (×3): 36000 [IU] via ORAL
  Filled 2016-11-29 (×4): qty 1

## 2016-11-29 MED ORDER — PANTOPRAZOLE SODIUM 40 MG PO TBEC
40.0000 mg | DELAYED_RELEASE_TABLET | Freq: Every day | ORAL | Status: DC
  Administered 2016-11-29: 40 mg via ORAL
  Filled 2016-11-29: qty 1

## 2016-11-29 MED ORDER — FUROSEMIDE 20 MG PO TABS
20.0000 mg | ORAL_TABLET | ORAL | Status: DC
  Administered 2016-11-29: 20 mg via ORAL
  Filled 2016-11-29: qty 1

## 2016-11-29 MED ORDER — GABAPENTIN 400 MG PO CAPS
400.0000 mg | ORAL_CAPSULE | Freq: Four times a day (QID) | ORAL | Status: DC
  Administered 2016-11-29 (×3): 400 mg via ORAL
  Filled 2016-11-29 (×3): qty 1

## 2016-11-29 MED ORDER — ATENOLOL 25 MG PO TABS: 100.0000 mg | ORAL_TABLET | Freq: Every day | ORAL | Status: DC

## 2016-11-29 NOTE — ED Notes (Signed)
Pt signed consent form to release info to her boyfriend, [name]. Copy faxed to [facility], copy sent to Medical Records, and original placed on clipboard.

## 2016-11-29 NOTE — ED Notes (Signed)
Pt on phone at nurses' desk. 

## 2016-11-29 NOTE — Progress Notes (Addendum)
Patient was declined at [facility] and at [facility] due to unable to complete her ADLs independently, per [place] and [place]. Tinnie Gens at [facility] left voicemail requesting call back.  Writer contacted Tinnie Gens at [facility] and was requested to provide the following information: Reason for patient taking [place], patient' MRI or CT scan, toxicology screen, patient's behavior in the past 24h, any IM meds or restraints. All information has been discussed with [facility] RN Ledon Snare. Clinicals faxed and discussed over the phone with intake Tinnie Gens at [facility].  Intake at [facility] planner and inquired if patient needed placement. Informed that we are still seeking bed.  This writer will continue to follow up and seek placement for patient.  Melbourne Abts, MSW, LCSWA Clinical social worker in disposition [facility], TTS Office [phone] and [phone] 11/29/2016 2:51 PM

## 2016-11-29 NOTE — ED Notes (Addendum)
Pt escorted to shower via w/c. Sitter w/pt.

## 2016-11-29 NOTE — ED Notes (Signed)
Pt. Now responsive to verbal stimuli. Pt. States she does not know where she is or why she is here. Will continue to monitor

## 2016-11-29 NOTE — ED Notes (Signed)
Pt has been asking repeatedly the next time she may receive "Percocet". Advised pt 1500 - voices understanding. States "I just want to make sure I can still get it".

## 2016-11-29 NOTE — ED Notes (Signed)
Pt had urine specimen cup with unknown misc pills. Cup of medications delivered to pharmacy.

## 2016-11-29 NOTE — ED Notes (Signed)
Pt attempting to call boyfriend to advise of transport - no one answered.
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
## 2016-11-29 NOTE — ED Notes (Signed)
Pt woke from sleeping - asking for Percocet. Advised will administer soon.

## 2016-11-29 NOTE — ED Notes (Signed)
Pt's valuables locked in safe. $86 in cash and 1 credit card. Envelope # [phone]

## 2016-11-29 NOTE — ED Notes (Signed)
Pt states she needs to be d/c'd to home d/t her boyfriend is in his RV by himself and she needs to check on him. States she is afraid something might happen to him d/t he is on Hospice. States she wants to "spend quality time w/him". Advised pt [facility] Counselor will re-assess her either tonight or in am. Pt initially denied she advised Counselor she was SI then states she did say that and adds "I'm always suicidal. If my boyfriend dies, I want to die too. That's why I was searching the internet for ways to commit suicide".

## 2016-11-29 NOTE — Progress Notes (Addendum)
Patient was recommended inpatient treatment per Nira Conn NP on 11/28/16.  Patient has been referred to the following inpatient psychiatric treatment programs at:   [facility], [place], [place], [facility] (for the [place] location), [place], [place].  At capacity: [address], [place], [place], [place], [place], [place], and [place].  CSW in disposition will continue to seek placement for this patient.  Melbourne Abts, MSW, [employer] Clinical social worker in disposition [facility], TTS Office [phone] and [phone] 11/29/2016 1:16 PM

## 2016-11-29 NOTE — ED Notes (Signed)
Regular Diet was ordered for Dinner. 

## 2016-11-29 NOTE — ED Notes (Signed)
Patient completing TTS assessment at this time.  

## 2016-11-29 NOTE — ED Notes (Signed)
Returned to room from shower.

## 2016-11-29 NOTE — ED Notes (Signed)
Pt. Belongings secured. Attempted to remove rings but not able.

## 2016-11-29 NOTE — Progress Notes (Signed)
Tinnie Gens at [facility] inquired if patient can do any ADL's independently. Writer attempted to call [facility] RN. Called again and discussed requested information. Updates provided to [place] at [place].  Tinnie Gens informed that he will talk to the doctor and call back.  [facility] Nurse's station provided as a call back number for if patient is accepted to [place]. [facility] RN [facility] aware.  Melbourne Abts, MSW, LCSWA Clinical social worker in disposition [facility], TTS Office [phone] and [phone] 11/29/2016 5:16 PM

## 2016-11-29 NOTE — ED Notes (Signed)
Colored pencils and paper given as requested.

## 2016-11-29 NOTE — ED Notes (Signed)
States she wants to leave to get to her "dying boyfriend". States she is not willing to go into an inpt facility unless it's in [place] d/t needs to be able to see her boyfriend.

## 2016-11-29 NOTE — ED Notes (Signed)
Pt. waunded and valuables taken and given to [employer], Charity fundraiser

## 2016-11-29 NOTE — ED Notes (Signed)
Patient was given Cookies and Cup of Diet Ginger Ale, and A Regular Diet was Ordered for Lunch.

## 2016-11-29 NOTE — ED Provider Notes (Signed)
Patient signed out to me at shift change. Patient in emergency department with altered mental status, loss of consciousness, and now suicidal thoughts.  Evaluated by TTS, who recommended inpatient admission. They will try to place her. PT is AAOx3. Reports suicidal thoughts. NAD at this time otherwise. I will put holding orders. Patient is voluntary.  Vitals:   11/28/16 2018 11/28/16 2030 11/28/16 2045 11/29/16 0548  BP:  111/67 107/71 128/67  Pulse:  66 66 71  Resp:  13 12 13   Temp:    98 F (36.7 C)  TempSrc:    Oral  SpO2: (S) 91% 95% 95% 90%  Weight:      Height:          Jaynie Crumble, PA-C 11/29/16 0806    Abelino Derrick, MD 11/30/16 [phone]

## 2016-11-29 NOTE — ED Notes (Signed)
Pt aware accepted to [facility] - voices agreement w/tx plan - states she wants to call her boyfriend first. Advised pt she may call him after she eats dinner.

## 2016-11-29 NOTE — ED Notes (Signed)
Pt noted to be irritable d/t Percocet not given at exactly 1500 - pt complaining to Sitter. When RN asked pt if there was a problem, pt stated "No. Everything's fine."

## 2016-11-29 NOTE — ED Notes (Signed)
Pharmacy at bedside

## 2016-11-30 LAB — CUP PACEART REMOTE DEVICE CHECK
Date Time Interrogation Session: 20180819224127
MDC IDC PG IMPLANT DT: 20180322

## 2016-12-10 NOTE — ED Provider Notes (Signed)
[facility]-EMERGENCY DEPT Provider Note   CSN: [id number] Arrival date & time: 11/28/16  1756     History   Chief Complaint Chief Complaint  Patient presents with  . Loss of Consciousness    HPI Tammy Hines is a 56 y.o. female.   Loss of Consciousness   Associated symptoms include back pain. Pertinent negatives include abdominal pain, chest pain, fever, palpitations, seizures and vomiting.   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 Per EMS, patient has been having relationship issues with her significant other, reportedly every time he  tries to leave patient has a syncopal event. Per EMS report, she took an extra Percocet today due to worsening left lower extremity pain in addition to her usual xanax. Seen in ED yesterday for fall and dizziness, had negative workup at that time and was discharged home.  On arrival, patient somnolent, unable to answer questions.    Past Medical History:  Diagnosis Date  . Anxiety   . Arthritis    "right knee, left hip, going down my neck into my right shoulder" (06/24/2016)  . Asthma   . Bipolar 1 disorder (HCC)   . Chronic lower back pain   . Chronic neck pain   . COPD (chronic obstructive pulmonary disease) (HCC)   . Depression    [name] 06/24/2016  . Family history of adverse reaction to anesthesia    "makes my mom throw up"   . Fibromyalgia   . GERD (gastroesophageal reflux disease)    [name] 06/24/2016  . Hepatitis B   . History of hiatal hernia   . History of stomach ulcers   . History of transient ischemic attack (TIA)    /RN 06/24/2016  . Hyperlipidemia   . Hypertension   . Ischemic stroke (HCC)    acute/notes 06/24/2016; "left sided weakness"/RN (06/24/2016  . Migraine    "a few/year now" (06/24/2016)  . Obesity   . PONV (postoperative nausea and vomiting)     Patient Active Problem List   Diagnosis Date Noted  . Acute encephalopathy  11/28/2016  . Stroke (cerebrum) (HCC)   . Acute ischemic stroke (HCC) 06/24/2016  . Bipolar disorder (HCC) 06/24/2016  . Hypertension 06/24/2016  . Hyperlipidemia 06/24/2016  . Anxiety 06/24/2016  . Facial droop   . GERD without esophagitis   . SORE THROAT 12/04/2008  . VIRAL URI 03/04/2008  . BLURRED VISION 10/22/2007  . FATIGUE 10/22/2007  . DISC DISEASE, LUMBAR 09/06/2007  . COUGH 04/20/2007  . Depression 11/19/2006  . Asthma 11/19/2006  . GERD 11/19/2006  . HEADACHE 11/19/2006  . HEPATITIS B, HX OF 11/19/2006    Past Surgical History:  Procedure Laterality Date  . ANAL FISSURE REPAIR    . FRACTURE SURGERY    . LOOP RECORDER INSERTION N/A 06/26/2016   Procedure: Loop Recorder Insertion;  Surgeon: Hillis Range, MD;  Location: [facility] INVASIVE CV LAB;  Service: Cardiovascular;  Laterality: N/A;  . MULTIPLE TOOTH EXTRACTIONS  2018   "I had 6 teeth pulled"  . SHOULDER ARTHROSCOPY W/ ROTATOR CUFF REPAIR Right 2000  . SHOULDER SURGERY Right 1999  . TEE WITHOUT CARDIOVERSION N/A 06/26/2016   Procedure: TRANSESOPHAGEAL ECHOCARDIOGRAM (TEE);  Surgeon: Thurmon Fair, MD;  Location: [facility] ENDOSCOPY;  Service: Cardiovascular;  Laterality: N/A;    OB History    No data available       Home Medications    Prior to Admission medications   Medication Sig  Start Date End Date Taking? Authorizing Provider  alprazolam Prudy Feeler) 2 MG tablet Take 2 mg by mouth 3 (three) times daily.    Yes [provider]  Asenapine Maleate (SAPHRIS) 10 MG SUBL Place 20 mg under the tongue at bedtime.   Yes [provider]  aspirin EC 325 MG EC tablet Take 1 tablet (325 mg total) by mouth daily. 06/28/16  Yes Hongalgi, Maximino Greenland, MD  atenolol (TENORMIN) 100 MG tablet Take 1 tablet (100 mg total) by mouth at bedtime. 06/30/16  Yes Hongalgi, Maximino Greenland, MD  buPROPion (WELLBUTRIN XL) 150 MG 24 hr tablet Take 150 mg by mouth daily. Take with wellbutrin xl 300 to equal 450 mg 10/20/16  Yes [provider]  buPROPion (WELLBUTRIN XL) 300 MG 24 hr tablet Take 300 mg by mouth daily. Take with wellbutrin xl 150 mg to equal 450 mg   Yes [provider]  CREON 36000 units CPEP capsule Take 36,000 Units by mouth 3 (three) times daily with meals.  10/20/16  Yes [provider]  dicyclomine (BENTYL) 10 MG capsule Take 10 mg by mouth 3 (three) times daily before meals.    Yes [provider]  FLUoxetine (PROZAC) 40 MG capsule Take 40 mg by mouth 3 (three) times daily.    Yes [provider]  furosemide (LASIX) 20 MG tablet Take 20 mg by mouth 2 (two) times daily.    Yes [provider]  gabapentin (NEURONTIN) 800 MG tablet Take 800 mg by mouth 4 (four) times daily.   Yes [provider]  hydrOXYzine (VISTARIL) 25 MG capsule Take 25 mg by mouth 4 (four) times daily. 11/18/16  Yes [provider]  MYRBETRIQ 25 MG TB24 tablet Take 50 mg by mouth every evening.  03/27/16  Yes [provider]  oxybutynin (DITROPAN XL) 15 MG 24 hr tablet Take 15 mg by mouth at bedtime.  10/20/16  Yes [provider]  oxyCODONE-acetaminophen (PERCOCET) 10-325 MG tablet Take 1 tablet by mouth 3 (three) times daily. Scheduled 7a, 12p, 5p 12/13/15  Yes [provider]  pantoprazole (PROTONIX) 40 MG tablet Take 40 mg by mouth daily.  10/20/16  Yes [provider]  simvastatin (ZOCOR) 40 MG tablet Take 40 mg by mouth daily. 11/17/16  Yes [provider]  VENTOLIN HFA 108 (90 Base) MCG/ACT inhaler Inhale 1-2 puffs into the lungs every 6 (six) hours as needed for wheezing.  11/17/16  Yes [provider]  naproxen (NAPROSYN) 500 MG tablet Take 1 tablet (500 mg total) by mouth 2 (two) times daily as needed for mild pain or moderate pain. Patient not taking: Reported on 11/29/2016 06/27/16   [name], MD  simvastatin (ZOCOR) 20 MG tablet Take 2 tablets (40 mg total) by mouth at bedtime. Patient not taking: Reported  on 11/29/2016 06/27/16   [name], MD    Family History Family History  Problem Relation Age of Onset  . Hypertension Mother   . Cancer Father        esophageal  . Heart attack Maternal Grandfather     Social History Social History  Substance Use Topics  . Smoking status: Current Every Day Smoker    Packs/day: 0.25    Years: 25.00    Types: Cigarettes  . Smokeless tobacco: Never Used  . Alcohol use 0.6 oz/week    1 Glasses of wine per week     Comment: 06/24/2016 "I'll have a drink once/year"  Allergies   Ampicillin; Cleocin [clindamycin hcl]; Clonazepam; Other; Penicillins; and Bupropion hcl   Review of Systems Review of Systems  Constitutional: Negative for chills and fever.  HENT: Negative for ear pain and sore throat.   Eyes: Negative for pain and visual disturbance.  Respiratory: Negative for cough and shortness of breath.   Cardiovascular: Positive for syncope. Negative for chest pain and palpitations.  Gastrointestinal: Negative for abdominal pain and vomiting.  Genitourinary: Negative for dysuria and hematuria.  Musculoskeletal: Positive for back pain and gait problem. Negative for arthralgias.  Skin: Negative for color change and rash.  Neurological: Positive for syncope and speech difficulty. Negative for seizures.  All other systems reviewed and are negative.    Physical Exam Updated Vital Signs BP 139/80 (BP Location: Right Arm)   Pulse 80   Temp 97.9 F (36.6 C) (Oral)   Resp 18   Ht 5\' 7"  (1.702 m)   Wt 122.5 kg (270 lb)   LMP  (LMP Unknown)   SpO2 93%   BMI 42.29 kg/m   Physical Exam  Constitutional: She appears well-developed and well-nourished. She appears lethargic. No distress.  HENT:  Head: Normocephalic and atraumatic.  Eyes: Conjunctivae are normal.  Neck: Neck supple.  Cardiovascular: Normal rate and regular rhythm.   No murmur heard. Pulmonary/Chest: Effort normal. No respiratory distress. She has wheezes.    Abdominal: Soft. There is no tenderness.  Musculoskeletal: She exhibits no edema.  Neurological: She has normal strength. She appears lethargic. No cranial nerve deficit or sensory deficit. GCS eye subscore is 2. GCS verbal subscore is 3. GCS motor subscore is 5.  somnolent  Skin: Skin is warm and dry.  Psychiatric: She has a normal mood and affect.  Nursing note and vitals reviewed.    ED Treatments / Results  Labs (all labs ordered are listed, but only abnormal results are displayed) Labs Reviewed  ACETAMINOPHEN LEVEL - Abnormal; Notable for the following:       Result Value   Acetaminophen (Tylenol), Serum <10 (*)    All other components within normal limits  COMPREHENSIVE METABOLIC PANEL - Abnormal; Notable for the following:    Creatinine, Ser 1.16 (*)    GFR calc non Af Amer 52 (*)    GFR calc Af Amer 60 (*)    All other components within normal limits  CBC WITH DIFFERENTIAL/PLATELET - Abnormal; Notable for the following:    Hemoglobin 10.8 (*)    MCH 25.8 (*)    RDW 16.7 (*)    All other components within normal limits  I-STAT ARTERIAL BLOOD GAS, ED - Abnormal; Notable for the following:    pO2, Arterial 58.0 (*)    Bicarbonate 28.1 (*)    Acid-Base Excess 3.0 (*)    All other components within normal limits  ETHANOL  I-STAT ARTERIAL BLOOD GAS, ED    EKG  EKG Interpretation None       Radiology No results found.  Procedures Procedures (including critical care time)  Medications Ordered in ED Medications  naloxone (NARCAN) injection 0.4 mg (0.4 mg Intravenous Given 11/28/16 1828)  ipratropium-albuterol (DUONEB) 0.5-2.5 (3) MG/3ML nebulizer solution 3 mL (3 mLs Nebulization Given 11/28/16 1849)     Initial Impression / Assessment and Plan / ED Course  I have reviewed the triage vital signs and the nursing notes.  Pertinent labs & imaging results that were available during my care of the patient were reviewed by me and considered in my medical decision  making (  see chart for details).     Patient with history of cryptogenic CVA, depression/anxiety, chronic pain who presents by EMS after syncopal episode in setting of argument with boyfriend.History obtained via EMS in triage report as patient is somnolent on exam. Seen yesterday with similar somnolence, was observed in ED after several hours, was able to walk unassisted, and was sent home.Today Patient arousable to sternal rub and pain. Slurred speech, otherwise nonfocal neuro exam. Able to move all extremities.   Labs stable from baseline. CT head negative for acute findings. ABG stable. While in ED patient was treated with DuoNeb due to diffuse wheezing on exam. Minimal response to Narcan.  Discussed with Hospitalist for admission for encephalopathy, although I suspect polypharmacy and psychiatric issues are at play. Ordered MRI given history of cryptogenic stroke.   However, on re-evaluation 5-6 hours later, patient alert and oriented. Does not remember argument with boyfriend or coming to hospital. Confirms additional percocet taken with her usual xanax. Denies any other coingestions. She is reporting suidicidal ideation. No specific plan. Consulted Psychology given this is patient's second episode for cataplexy type symptoms in setting of polypharmacy and SI.   Transfer of care to [facility], please see her note for final patient disposition.   Patient and plan of care discussed with Attending physician, Dr. Rhunette Croft.      Final Clinical Impressions(s) / ED Diagnoses   Final diagnoses:  Encephalopathy  Syncope, unspecified syncope type  Suicidal ideation    New Prescriptions Discharge Medication List as of 11/29/2016  7:15 PM      I saw and evaluated the patient, reviewed the resident's note and I agree with the findings and plan.   EKG Interpretation None        Pt comes in with cc of syncope. She is noted to be somnolent and encephalopathic. Pt was seen yday  for similar symptoms and after hours of watching her, she improved and was discharged. Seems like pt also has some SI. Medically cleared for psych Rhys Martini   Derwood Kaplan, MD 12/10/16 [phone]

## 2016-12-23 ENCOUNTER — Ambulatory Visit (INDEPENDENT_AMBULATORY_CARE_PROVIDER_SITE_OTHER): Payer: Medicare Other | Admitting: *Deleted

## 2016-12-23 DIAGNOSIS — I639 Cerebral infarction, unspecified: Secondary | ICD-10-CM | POA: Diagnosis not present

## 2016-12-24 NOTE — Progress Notes (Signed)
Carelink Summary Report / Loop Recorder 

## 2016-12-25 LAB — CUP PACEART REMOTE DEVICE CHECK
Date Time Interrogation Session: 20180918223813
Implantable Pulse Generator Implant Date: 20180322

## 2017-01-19 ENCOUNTER — Telehealth: Payer: Self-pay

## 2017-01-19 NOTE — Telephone Encounter (Signed)
Rn call patient about falling. Rn tried to schedule appt but pt stated she did not have the money right now. Pt wanted to know if Dr. Pearlean Brownie can tell her why she is falling. Per note pt uses a walker and has had multiple falls. Rn advise pt to schedule sooner appt when she can. PT wanted to know can Dr. Pearlean Brownie find out why she is falling. Rn again stated to pt she will need to come in to be evaluated. Pt has knee problems,and back problems prior to the stroke. Pt refused to schedule appt. Rn advised pt to call back when she wants to r/s. Pt verbalized understanding.

## 2017-01-19 NOTE — Telephone Encounter (Signed)
Patient called stating that she has had numerous falls since her stroke. She called her PCP but they advised her that her symptoms were neuro related and to contact us. I offered her an appointment but she stated that she wanted to speak with a nurse first before scheduling an OV because her boyfriend recently passed away and she is struggling financially.

## 2017-01-22 ENCOUNTER — Ambulatory Visit (INDEPENDENT_AMBULATORY_CARE_PROVIDER_SITE_OTHER): Payer: Medicare Other | Admitting: *Deleted

## 2017-01-22 DIAGNOSIS — I639 Cerebral infarction, unspecified: Secondary | ICD-10-CM

## 2017-01-23 LAB — CUP PACEART REMOTE DEVICE CHECK
Date Time Interrogation Session: 20181018233808
MDC IDC PG IMPLANT DT: 20180322

## 2017-01-23 NOTE — Progress Notes (Signed)
Carelink Summary Report / Loop Recorder 

## 2017-02-18 ENCOUNTER — Telehealth: Payer: Self-pay | Admitting: Cardiology

## 2017-02-18 NOTE — Telephone Encounter (Signed)
Attempted to call pt b/c her home monitor has not updated in the last 14 days. No answer and unable to leave a message.  

## 2017-02-23 ENCOUNTER — Ambulatory Visit (INDEPENDENT_AMBULATORY_CARE_PROVIDER_SITE_OTHER): Payer: Medicare Other | Admitting: *Deleted

## 2017-02-23 DIAGNOSIS — I639 Cerebral infarction, unspecified: Secondary | ICD-10-CM

## 2017-02-24 NOTE — Progress Notes (Signed)
Carelink Summary Report / Loop Recorder 

## 2017-02-25 ENCOUNTER — Encounter: Payer: Self-pay | Admitting: Cardiology

## 2017-03-10 ENCOUNTER — Emergency Department (HOSPITAL_COMMUNITY)
Admission: EM | Admit: 2017-03-10 | Discharge: 2017-03-11 | Disposition: A | Discharge status: Other Acute Inpatient Hospital | Payer: Medicare Other | Attending: Emergency Medicine | Admitting: Emergency Medicine

## 2017-03-10 ENCOUNTER — Encounter (HOSPITAL_COMMUNITY): Payer: Self-pay | Admitting: Emergency Medicine

## 2017-03-10 ENCOUNTER — Other Ambulatory Visit: Payer: Self-pay

## 2017-03-10 DIAGNOSIS — F329 Major depressive disorder, single episode, unspecified: Secondary | ICD-10-CM | POA: Diagnosis not present

## 2017-03-10 DIAGNOSIS — F4321 Adjustment disorder with depressed mood: Secondary | ICD-10-CM | POA: Diagnosis not present

## 2017-03-10 DIAGNOSIS — J449 Chronic obstructive pulmonary disease, unspecified: Secondary | ICD-10-CM | POA: Insufficient documentation

## 2017-03-10 DIAGNOSIS — I10 Essential (primary) hypertension: Secondary | ICD-10-CM | POA: Insufficient documentation

## 2017-03-10 DIAGNOSIS — F32A Depression, unspecified: Secondary | ICD-10-CM

## 2017-03-10 DIAGNOSIS — Z79899 Other long term (current) drug therapy: Secondary | ICD-10-CM | POA: Diagnosis not present

## 2017-03-10 DIAGNOSIS — R45851 Suicidal ideations: Secondary | ICD-10-CM | POA: Diagnosis not present

## 2017-03-10 DIAGNOSIS — F332 Major depressive disorder, recurrent severe without psychotic features: Secondary | ICD-10-CM

## 2017-03-10 DIAGNOSIS — F17210 Nicotine dependence, cigarettes, uncomplicated: Secondary | ICD-10-CM | POA: Insufficient documentation

## 2017-03-10 LAB — ETHANOL: Alcohol, Ethyl (B): <10 mg/dL (ref <10)

## 2017-03-10 LAB — CBC
HEMATOCRIT: 40.8 % (ref 36.0–46.0)
HEMOGLOBIN: 12.9 g/dL (ref 12.0–15.0)
MCH: 26.1 pg (ref 26.0–34.0)
MCHC: 31.6 g/dL (ref 30.0–36.0)
MCV: 82.6 fL (ref 78.0–100.0)
Platelets: 291 10*3/uL (ref 150–400)
RBC: 4.94 MIL/uL (ref 3.87–5.11)
RDW: 16.7 % — ABNORMAL HIGH (ref 11.5–15.5)
WBC: 12.5 10*3/uL — ABNORMAL HIGH (ref 4.0–10.5)

## 2017-03-10 LAB — COMPREHENSIVE METABOLIC PANEL
ALBUMIN: 3.9 g/dL (ref 3.5–5.0)
ALK PHOS: 104 U/L (ref 38–126)
ALT: 29 U/L (ref 14–54)
AST: 32 U/L (ref 15–41)
Anion gap: 9 (ref 5–15)
BUN: 13 mg/dL (ref 6–20)
CALCIUM: 9.3 mg/dL (ref 8.9–10.3)
CO2: 28 mmol/L (ref 22–32)
CREATININE: 0.86 mg/dL (ref 0.44–1.00)
Chloride: 100 mmol/L — ABNORMAL LOW (ref 101–111)
GFR calc Af Amer: >60 mL/min (ref >60)
GFR calc non Af Amer: >60 mL/min (ref >60)
GLUCOSE: 86 mg/dL (ref 65–99)
Potassium: 3.4 mmol/L — ABNORMAL LOW (ref 3.5–5.1)
SODIUM: 137 mmol/L (ref 135–145)
Total Bilirubin: 0.5 mg/dL (ref 0.3–1.2)
Total Protein: 7.7 g/dL (ref 6.5–8.1)

## 2017-03-10 LAB — CUP PACEART REMOTE DEVICE CHECK
Implantable Pulse Generator Implant Date: 20180322
MDC IDC SESS DTM: 20181118011406

## 2017-03-10 LAB — RAPID URINE DRUG SCREEN, HOSP PERFORMED
Amphetamines: NOT DETECTED
BARBITURATES: NOT DETECTED
Benzodiazepines: POSITIVE — AB
COCAINE: NOT DETECTED
Opiates: NOT DETECTED
TETRAHYDROCANNABINOL: NOT DETECTED

## 2017-03-10 LAB — I-STAT BETA HCG BLOOD, ED (MC, WL, AP ONLY): I-stat hCG, quantitative: 7 m[IU]/mL — ABNORMAL HIGH (ref <5)

## 2017-03-10 LAB — SALICYLATE LEVEL: Salicylate Lvl: <7 mg/dL (ref 2.8–30.0)

## 2017-03-10 LAB — ACETAMINOPHEN LEVEL

## 2017-03-10 MED ORDER — FLUOXETINE HCL 20 MG PO CAPS
40.0000 mg | ORAL_CAPSULE | Freq: Three times a day (TID) | ORAL | Status: DC
  Administered 2017-03-10 – 2017-03-11 (×2): 40 mg via ORAL
  Filled 2017-03-10 (×2): qty 2

## 2017-03-10 MED ORDER — FUROSEMIDE 20 MG PO TABS
20.0000 mg | ORAL_TABLET | Freq: Two times a day (BID) | ORAL | Status: DC
  Administered 2017-03-11: 40 mg via ORAL
  Filled 2017-03-10 (×2): qty 1

## 2017-03-10 MED ORDER — HYDROXYZINE HCL 25 MG PO TABS
25.0000 mg | ORAL_TABLET | Freq: Four times a day (QID) | ORAL | Status: DC
  Administered 2017-03-10 – 2017-03-11 (×3): 25 mg via ORAL
  Filled 2017-03-10 (×3): qty 1

## 2017-03-10 MED ORDER — NAPROXEN 500 MG PO TABS
500.0000 mg | ORAL_TABLET | Freq: Two times a day (BID) | ORAL | Status: DC | PRN
  Administered 2017-03-10: 500 mg via ORAL
  Filled 2017-03-10: qty 1

## 2017-03-10 MED ORDER — ASPIRIN EC 325 MG PO TBEC
325.0000 mg | DELAYED_RELEASE_TABLET | Freq: Every day | ORAL | Status: DC
  Administered 2017-03-11: 325 mg via ORAL
  Filled 2017-03-10: qty 1

## 2017-03-10 MED ORDER — ALBUTEROL SULFATE HFA 108 (90 BASE) MCG/ACT IN AERS: 1.0000 | INHALATION_SPRAY | Freq: Four times a day (QID) | RESPIRATORY_TRACT | Status: DC | PRN

## 2017-03-10 MED ORDER — ALPRAZOLAM 1 MG PO TABS
2.0000 mg | ORAL_TABLET | Freq: Three times a day (TID) | ORAL | Status: DC
  Administered 2017-03-10 – 2017-03-11 (×2): 2 mg via ORAL
  Filled 2017-03-10 (×2): qty 2

## 2017-03-10 MED ORDER — BUPROPION HCL ER (XL) 150 MG PO TB24
150.0000 mg | ORAL_TABLET | Freq: Every day | ORAL | Status: DC
  Administered 2017-03-10 – 2017-03-11 (×2): 150 mg via ORAL
  Filled 2017-03-10 (×3): qty 1

## 2017-03-10 MED ORDER — BUPROPION HCL ER (XL) 150 MG PO TB24
300.0000 mg | ORAL_TABLET | Freq: Every day | ORAL | Status: DC
  Administered 2017-03-10 – 2017-03-11 (×3): 300 mg via ORAL
  Filled 2017-03-10 (×3): qty 2

## 2017-03-10 MED ORDER — OXYCODONE-ACETAMINOPHEN 5-325 MG PO TABS
1.0000 | ORAL_TABLET | ORAL | Status: DC
  Administered 2017-03-11 (×2): 1 via ORAL
  Filled 2017-03-10 (×2): qty 1

## 2017-03-10 MED ORDER — MIRABEGRON ER 25 MG PO TB24
50.0000 mg | ORAL_TABLET | Freq: Every day | ORAL | Status: DC
  Administered 2017-03-10: 50 mg via ORAL
  Filled 2017-03-10: qty 2

## 2017-03-10 MED ORDER — OXYBUTYNIN CHLORIDE ER 5 MG PO TB24
15.0000 mg | ORAL_TABLET | Freq: Every day | ORAL | Status: DC
  Administered 2017-03-10: 15 mg via ORAL
  Filled 2017-03-10: qty 1
  Filled 2017-03-10: qty 3

## 2017-03-10 MED ORDER — DICYCLOMINE HCL 10 MG PO CAPS
10.0000 mg | ORAL_CAPSULE | Freq: Three times a day (TID) | ORAL | Status: DC
  Administered 2017-03-11 (×2): 10 mg via ORAL
  Filled 2017-03-10 (×3): qty 1

## 2017-03-10 MED ORDER — PANTOPRAZOLE SODIUM 40 MG PO TBEC
40.0000 mg | DELAYED_RELEASE_TABLET | Freq: Every day | ORAL | Status: DC
  Administered 2017-03-10 – 2017-03-11 (×2): 40 mg via ORAL
  Filled 2017-03-10 (×2): qty 1

## 2017-03-10 MED ORDER — GABAPENTIN 400 MG PO CAPS
800.0000 mg | ORAL_CAPSULE | Freq: Four times a day (QID) | ORAL | Status: DC
  Administered 2017-03-10 – 2017-03-11 (×3): 800 mg via ORAL
  Filled 2017-03-10 (×3): qty 2

## 2017-03-10 MED ORDER — PANCRELIPASE (LIP-PROT-AMYL) 36000-114000 UNITS PO CPEP
36000.0000 [IU] | ORAL_CAPSULE | Freq: Three times a day (TID) | ORAL | Status: DC
  Administered 2017-03-11 (×2): 36000 [IU] via ORAL
  Filled 2017-03-10 (×3): qty 1

## 2017-03-10 MED ORDER — ATENOLOL 100 MG PO TABS
100.0000 mg | ORAL_TABLET | Freq: Every day | ORAL | Status: DC
  Administered 2017-03-10: 100 mg via ORAL
  Filled 2017-03-10: qty 1

## 2017-03-10 MED ORDER — ASENAPINE MALEATE 5 MG SL SUBL
20.0000 mg | SUBLINGUAL_TABLET | Freq: Every day | SUBLINGUAL | Status: DC
  Administered 2017-03-10: 10 mg via SUBLINGUAL
  Filled 2017-03-10 (×2): qty 4

## 2017-03-10 MED ORDER — HYDROXYZINE PAMOATE 25 MG PO CAPS: 25.0000 mg | ORAL_CAPSULE | Freq: Four times a day (QID) | ORAL | Status: DC

## 2017-03-10 MED ORDER — OXYCODONE HCL 5 MG PO TABS
5.0000 mg | ORAL_TABLET | ORAL | Status: DC
  Administered 2017-03-11 (×2): 5 mg via ORAL
  Filled 2017-03-10 (×2): qty 1

## 2017-03-10 MED ORDER — SIMVASTATIN 40 MG PO TABS
40.0000 mg | ORAL_TABLET | Freq: Every day | ORAL | Status: DC
  Administered 2017-03-11: 40 mg via ORAL
  Filled 2017-03-10: qty 1

## 2017-03-10 MED ORDER — OXYCODONE-ACETAMINOPHEN 10-325 MG PO TABS: 1.0000 | ORAL_TABLET | Freq: Three times a day (TID) | ORAL | Status: DC

## 2017-03-10 NOTE — ED Notes (Addendum)
Pt undressed. Pt asking to keep her scarf due to sentiment. Also rings on fingers wont come off. She is unable to take out her eyebrow piercing.    Pt given paper towel to write numbers on from cell phone.
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
## 2017-03-10 NOTE — ED Notes (Signed)
Bed: HQ46WA26 Expected date: 03/10/17 Expected time:  Means of arrival:  Comments: Triage 4

## 2017-03-10 NOTE — ED Notes (Signed)
Bed: WLPT4 Expected date:  Expected time:  Means of arrival:  Comments: 

## 2017-03-10 NOTE — ED Provider Notes (Addendum)
[facility]-EMERGENCY DEPT Provider Note   CSN: [id number] Arrival date & time: 03/10/17  1633     History   Chief Complaint Chief Complaint  Patient presents with  . Suicidal    HPI Tammy Hines is a 56 y.o. female.  HPI Patient presents with depression and suicidal thoughts.  States that since her fianc died 3 months ago she has been doing worse.  States that the day she had a fight with her mother.  States that whenever she goes to see her mother she has to take extra medicines.  States she took an extra Xanax and and extra Neurontin.  States this was not to hurt herself however she states that she was trying to get someone to take her cats so she could take extra morphine that her fianc had and had a hidden an attempt to kill herself.  States she never loved someone is much that she loves him. Past Medical History:  Diagnosis Date  . Anxiety   . Arthritis    "right knee, left hip, going down my neck into my right shoulder" (06/24/2016)  . Asthma   . Bipolar 1 disorder (HCC)   . Chronic lower back pain   . Chronic neck pain   . COPD (chronic obstructive pulmonary disease) (HCC)   . Depression    Tammy Hines 06/24/2016  . Family history of adverse reaction to anesthesia    "makes my mom throw up"   . Fibromyalgia   . GERD (gastroesophageal reflux disease)    Tammy Hines 06/24/2016  . Hepatitis B   . History of hiatal hernia   . History of stomach ulcers   . History of transient ischemic attack (TIA)    /RN 06/24/2016  . Hyperlipidemia   . Hypertension   . Ischemic stroke (HCC)    acute/notes 06/24/2016; "left sided weakness"/RN (06/24/2016  . Migraine    "a few/year now" (06/24/2016)  . Obesity   . PONV (postoperative nausea and vomiting)     Patient Active Problem List   Diagnosis Date Noted  . Acute encephalopathy 11/28/2016  . Stroke (cerebrum) (HCC)   . Acute ischemic stroke (HCC) 06/24/2016  . Bipolar disorder (HCC) 06/24/2016  .  Hypertension 06/24/2016  . Hyperlipidemia 06/24/2016  . Anxiety 06/24/2016  . Facial droop   . GERD without esophagitis   . SORE THROAT 12/04/2008  . VIRAL URI 03/04/2008  . BLURRED VISION 10/22/2007  . FATIGUE 10/22/2007  . DISC DISEASE, LUMBAR 09/06/2007  . COUGH 04/20/2007  . Depression 11/19/2006  . Asthma 11/19/2006  . GERD 11/19/2006  . HEADACHE 11/19/2006  . HEPATITIS B, HX OF 11/19/2006    Past Surgical History:  Procedure Laterality Date  . ANAL FISSURE REPAIR    . FRACTURE SURGERY    . LOOP RECORDER INSERTION N/A 06/26/2016   Procedure: Loop Recorder Insertion;  Surgeon: Hillis Range, MD;  [facility] INVASIVE CV LAB;  Service: Cardiovascular;  Laterality: N/A;  . MULTIPLE TOOTH EXTRACTIONS  2018   "I had 6 teeth pulled"  . SHOULDER ARTHROSCOPY W/ ROTATOR CUFF REPAIR Right 2000  . SHOULDER SURGERY Right 1999  . TEE WITHOUT CARDIOVERSION N/A 06/26/2016   Procedure: TRANSESOPHAGEAL ECHOCARDIOGRAM (TEE);  Surgeon: Thurmon Fair, MD;  Location: [facility] ENDOSCOPY;  Service: Cardiovascular;  Laterality: N/A;    OB History    No data available       Home Medications    Prior to Admission medications   Medication Sig Start Date End Date  Taking? Authorizing Provider  alprazolam Prudy Feeler(XANAX) 2 MG tablet Take 2 mg by mouth 3 (three) times daily.    Yes [provider]  Asenapine Maleate (SAPHRIS) 10 MG SUBL Place 20 mg under the tongue at bedtime.   Yes [provider]  aspirin EC 325 MG EC tablet Take 1 tablet (325 mg total) by mouth daily. 06/28/16  Yes Hongalgi, Maximino GreenlandAnand D, MD  atenolol (TENORMIN) 100 MG tablet Take 1 tablet (100 mg total) by mouth at bedtime. 06/30/16  Yes Hongalgi, Maximino GreenlandAnand D, MD  buPROPion (WELLBUTRIN XL) 150 MG 24 hr tablet Take 150 mg by mouth daily. Take with wellbutrin xl 300 to equal 450 mg 10/20/16  Yes [provider]  buPROPion (WELLBUTRIN XL) 300 MG 24 hr tablet Take 300 mg by mouth daily. Take with wellbutrin xl 150 mg to equal  450 mg   Yes [provider]  CREON 36000 units CPEP capsule Take 36,000 Units by mouth 3 (three) times daily with meals.  10/20/16  Yes [provider]  dicyclomine (BENTYL) 10 MG capsule Take 10 mg by mouth 3 (three) times daily before meals.    Yes [provider]  FLUoxetine (PROZAC) 40 MG capsule Take 40 mg by mouth 3 (three) times daily.    Yes [provider]  furosemide (LASIX) 20 MG tablet Take 20 mg by mouth 2 (two) times daily.    Yes [provider]  gabapentin (NEURONTIN) 800 MG tablet Take 800 mg by mouth 4 (four) times daily.   Yes [provider]  hydrOXYzine (VISTARIL) 25 MG capsule Take 25 mg by mouth 4 (four) times daily. 11/18/16  Yes [provider]  naproxen (NAPROSYN) 500 MG tablet Take 1 tablet (500 mg total) by mouth 2 (two) times daily as needed for mild pain or moderate pain. 06/27/16  Yes Hongalgi, Maximino GreenlandAnand D, MD  oxybutynin (DITROPAN XL) 15 MG 24 hr tablet Take 15 mg by mouth at bedtime.  10/20/16  Yes [provider]  oxyCODONE-acetaminophen (PERCOCET) 10-325 MG tablet Take 1 tablet by mouth 3 (three) times daily. Scheduled 7a, 12p, 5p 12/13/15  Yes [provider]  pantoprazole (PROTONIX) 40 MG tablet Take 40 mg by mouth daily.  10/20/16  Yes [provider]  simvastatin (ZOCOR) 40 MG tablet Take 40 mg by mouth daily. 11/17/16  Yes [provider]  VENTOLIN HFA 108 (90 Base) MCG/ACT inhaler Inhale 1-2 puffs into the lungs every 6 (six) hours as needed for wheezing.  11/17/16  Yes [provider]  MYRBETRIQ 50 MG TB24 tablet Take 1 tablet by mouth at bedtime. 03/06/17   [provider]  simvastatin (ZOCOR) 20 MG tablet Take 2 tablets (40 mg total) by mouth at bedtime. Patient not taking: Reported on 11/29/2016 06/27/16   Elease EtienneHongalgi, Anand D, MD    Family History Family History  Problem Relation Age of Onset  . Hypertension Mother   . Cancer Father        esophageal   . Heart attack Maternal Grandfather     Social History Social History   Tobacco Use  . Smoking status: Current Every Day Smoker    Packs/day: 0.25    Years: 25.00    Pack years: 6.25    Types: Cigarettes  . Smokeless tobacco: Never Used  Substance Use Topics  . Alcohol use: Yes    Alcohol/week: 0.6 oz    Types: 1 Glasses of wine per week    Comment: "I drank one smironoff  ice before I came) 12/04  . Drug use: No     Allergies   Ampicillin; Cleocin [clindamycin hcl]; Clonazepam; Other; Penicillins; and Bupropion hcl   Review of Systems Review of Systems  Constitutional: Positive for appetite change. Negative for fever.  HENT: Negative for congestion.   Respiratory: Negative for shortness of breath.   Cardiovascular: Negative for chest pain.  Gastrointestinal: Negative for abdominal pain.  Genitourinary: Negative for frequency.  Musculoskeletal: Negative for gait problem.  Skin: Negative for color change.  Neurological: Negative for speech difficulty.  Hematological: Negative for adenopathy.  Psychiatric/Behavioral: Positive for dysphoric mood and suicidal ideas. The patient is nervous/anxious.      Physical Exam Updated Vital Signs BP 119/78 (BP Location: Left Arm)   Pulse 94   Temp 98.6 F (37 C) (Oral)   Resp 17   Ht 5\' 7"  (1.702 m)   Wt 99.3 kg (219 lb)   LMP  (LMP Unknown)   SpO2 92%   BMI 34.30 kg/m   Physical Exam  Constitutional: She appears well-developed.  HENT:  Head: Atraumatic.  Neck: Neck supple.  Cardiovascular: Normal rate.  Pulmonary/Chest: Effort normal.  Abdominal: Soft.  Musculoskeletal: She exhibits no edema.  Neurological: She is alert.  Skin: Skin is warm. Capillary refill takes less than 2 seconds.  Psychiatric:  Patient is somewhat tearful     ED Treatments / Results  Labs (all labs ordered are listed, but only abnormal results are displayed) Labs Reviewed  COMPREHENSIVE METABOLIC PANEL - Abnormal; Notable for the  following components:      Result Value   Potassium 3.4 (*)    Chloride 100 (*)    All other components within normal limits  ACETAMINOPHEN LEVEL - Abnormal; Notable for the following components:   Acetaminophen (Tylenol), Serum <10 (*)    All other components within normal limits  CBC - Abnormal; Notable for the following components:   WBC 12.5 (*)    RDW 16.7 (*)    All other components within normal limits  I-STAT BETA HCG BLOOD, ED ([facility], [facility], [facility] ONLY) - Abnormal; Notable for the following components:   I-stat hCG, quantitative 7.0 (*)    All other components within normal limits  ETHANOL  SALICYLATE LEVEL  RAPID URINE DRUG SCREEN, HOSP PERFORMED    EKG  EKG Interpretation None       Radiology No results found.  Procedures Procedures (including critical care time)  Medications Ordered in ED Medications - No data to display   Initial Impression / Assessment and Plan / ED Course  I have reviewed the triage vital signs and the nursing notes.  Pertinent labs & imaging results that were available during my care of the patient were reviewed by me and considered in my medical decision making (see chart for details).     Patient with depression and suicidal thoughts.  At this point appears to be medically cleared.  To be seen by TTS.  Final Clinical Impressions(s) / ED Diagnoses   Final diagnoses:  Depression, unspecified depression type  Suicidal ideations  Grief    ED Discharge Orders    None       Benjiman CorePickering, Collis Thede, MD 03/10/17 2000  TTS has recommended inpatient treatment.   Benjiman CorePickering, Benjermin Korber, MD 03/10/17 2110
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
## 2017-03-10 NOTE — ED Notes (Signed)
Bed: WA30 Expected date:  Expected time:  Means of arrival:  Comments: 

## 2017-03-11 ENCOUNTER — Inpatient Hospital Stay (HOSPITAL_COMMUNITY): Payer: Medicare Other

## 2017-03-11 ENCOUNTER — Encounter (HOSPITAL_COMMUNITY): Payer: Self-pay

## 2017-03-11 ENCOUNTER — Inpatient Hospital Stay (HOSPITAL_COMMUNITY)
Admission: AD | Admit: 2017-03-11 | Discharge: 2017-03-14 | DRG: 885 | Disposition: A | Discharge status: Home / Self Care | Payer: Medicare Other | Source: Intra-hospital | Attending: Emergency Medicine | Admitting: Emergency Medicine

## 2017-03-11 ENCOUNTER — Other Ambulatory Visit: Payer: Self-pay

## 2017-03-11 DIAGNOSIS — G43909 Migraine, unspecified, not intractable, without status migrainosus: Secondary | ICD-10-CM | POA: Diagnosis present

## 2017-03-11 DIAGNOSIS — R451 Restlessness and agitation: Secondary | ICD-10-CM | POA: Diagnosis not present

## 2017-03-11 DIAGNOSIS — Z8673 Personal history of transient ischemic attack (TIA), and cerebral infarction without residual deficits: Secondary | ICD-10-CM

## 2017-03-11 DIAGNOSIS — F39 Unspecified mood [affective] disorder: Secondary | ICD-10-CM | POA: Diagnosis not present

## 2017-03-11 DIAGNOSIS — M797 Fibromyalgia: Secondary | ICD-10-CM | POA: Diagnosis present

## 2017-03-11 DIAGNOSIS — Z8619 Personal history of other infectious and parasitic diseases: Secondary | ICD-10-CM | POA: Diagnosis not present

## 2017-03-11 DIAGNOSIS — F314 Bipolar disorder, current episode depressed, severe, without psychotic features: Principal | ICD-10-CM | POA: Diagnosis present

## 2017-03-11 DIAGNOSIS — K219 Gastro-esophageal reflux disease without esophagitis: Secondary | ICD-10-CM | POA: Diagnosis present

## 2017-03-11 DIAGNOSIS — F332 Major depressive disorder, recurrent severe without psychotic features: Secondary | ICD-10-CM | POA: Diagnosis not present

## 2017-03-11 DIAGNOSIS — G4700 Insomnia, unspecified: Secondary | ICD-10-CM | POA: Diagnosis not present

## 2017-03-11 DIAGNOSIS — R296 Repeated falls: Secondary | ICD-10-CM | POA: Diagnosis present

## 2017-03-11 DIAGNOSIS — Y92239 Unspecified place in hospital as the place of occurrence of the external cause: Secondary | ICD-10-CM

## 2017-03-11 DIAGNOSIS — R450 Nervousness: Secondary | ICD-10-CM

## 2017-03-11 DIAGNOSIS — E871 Hypo-osmolality and hyponatremia: Secondary | ICD-10-CM | POA: Diagnosis present

## 2017-03-11 DIAGNOSIS — W19XXXA Unspecified fall, initial encounter: Secondary | ICD-10-CM

## 2017-03-11 DIAGNOSIS — F17210 Nicotine dependence, cigarettes, uncomplicated: Secondary | ICD-10-CM | POA: Diagnosis present

## 2017-03-11 DIAGNOSIS — R55 Syncope and collapse: Secondary | ICD-10-CM | POA: Diagnosis present

## 2017-03-11 DIAGNOSIS — Z7982 Long term (current) use of aspirin: Secondary | ICD-10-CM

## 2017-03-11 DIAGNOSIS — R45851 Suicidal ideations: Secondary | ICD-10-CM | POA: Diagnosis present

## 2017-03-11 DIAGNOSIS — Z79899 Other long term (current) drug therapy: Secondary | ICD-10-CM

## 2017-03-11 DIAGNOSIS — F419 Anxiety disorder, unspecified: Secondary | ICD-10-CM | POA: Diagnosis present

## 2017-03-11 DIAGNOSIS — G8929 Other chronic pain: Secondary | ICD-10-CM | POA: Diagnosis present

## 2017-03-11 DIAGNOSIS — I10 Essential (primary) hypertension: Secondary | ICD-10-CM | POA: Diagnosis present

## 2017-03-11 DIAGNOSIS — M1990 Unspecified osteoarthritis, unspecified site: Secondary | ICD-10-CM | POA: Diagnosis present

## 2017-03-11 DIAGNOSIS — M545 Low back pain: Secondary | ICD-10-CM | POA: Diagnosis present

## 2017-03-11 DIAGNOSIS — M542 Cervicalgia: Secondary | ICD-10-CM | POA: Diagnosis present

## 2017-03-11 DIAGNOSIS — J449 Chronic obstructive pulmonary disease, unspecified: Secondary | ICD-10-CM | POA: Diagnosis present

## 2017-03-11 DIAGNOSIS — E785 Hyperlipidemia, unspecified: Secondary | ICD-10-CM | POA: Diagnosis present

## 2017-03-11 DIAGNOSIS — F329 Major depressive disorder, single episode, unspecified: Secondary | ICD-10-CM | POA: Diagnosis not present

## 2017-03-11 LAB — URINALYSIS, ROUTINE W REFLEX MICROSCOPIC
Bilirubin Urine: NEGATIVE
GLUCOSE, UA: NEGATIVE mg/dL
Ketones, ur: NEGATIVE mg/dL
Leukocytes, UA: NEGATIVE
Nitrite: NEGATIVE
PH: 7 (ref 5.0–8.0)
Protein, ur: NEGATIVE mg/dL
SPECIFIC GRAVITY, URINE: 1.006 (ref 1.005–1.030)

## 2017-03-11 MED ORDER — DICYCLOMINE HCL 10 MG PO CAPS
10.0000 mg | ORAL_CAPSULE | Freq: Three times a day (TID) | ORAL | Status: DC
  Administered 2017-03-11 – 2017-03-14 (×7): 10 mg via ORAL
  Filled 2017-03-11 (×18): qty 1

## 2017-03-11 MED ORDER — BUPROPION HCL ER (XL) 150 MG PO TB24
150.0000 mg | ORAL_TABLET | Freq: Every day | ORAL | Status: DC
Start: 2017-03-12 — End: 2017-03-11
  Filled 2017-03-11: qty 1

## 2017-03-11 MED ORDER — ALUM & MAG HYDROXIDE-SIMETH 200-200-20 MG/5ML PO SUSP: 30.0000 mL | ORAL | Status: DC | PRN

## 2017-03-11 MED ORDER — NAPROXEN 500 MG PO TABS
500.0000 mg | ORAL_TABLET | Freq: Two times a day (BID) | ORAL | Status: DC | PRN
  Administered 2017-03-13: 500 mg via ORAL
  Filled 2017-03-11: qty 1

## 2017-03-11 MED ORDER — ALPRAZOLAM 1 MG PO TABS: 2.0000 mg | ORAL_TABLET | Freq: Three times a day (TID) | ORAL | Status: DC

## 2017-03-11 MED ORDER — BUPROPION HCL ER (XL) 150 MG PO TB24
450.0000 mg | ORAL_TABLET | Freq: Every day | ORAL | Status: DC
  Administered 2017-03-12 – 2017-03-14 (×3): 450 mg via ORAL
  Filled 2017-03-11 (×6): qty 3

## 2017-03-11 MED ORDER — OXYCODONE-ACETAMINOPHEN 5-325 MG PO TABS
1.0000 | ORAL_TABLET | ORAL | Status: DC
  Administered 2017-03-11 – 2017-03-14 (×7): 1 via ORAL
  Filled 2017-03-11 (×7): qty 1

## 2017-03-11 MED ORDER — ATENOLOL 50 MG PO TABS
100.0000 mg | ORAL_TABLET | Freq: Every day | ORAL | Status: DC
  Administered 2017-03-11 – 2017-03-14 (×3): 100 mg via ORAL
  Filled 2017-03-11 (×6): qty 2

## 2017-03-11 MED ORDER — ALBUTEROL SULFATE HFA 108 (90 BASE) MCG/ACT IN AERS: 1.0000 | INHALATION_SPRAY | Freq: Four times a day (QID) | RESPIRATORY_TRACT | Status: DC | PRN

## 2017-03-11 MED ORDER — OXYBUTYNIN CHLORIDE ER 15 MG PO TB24
15.0000 mg | ORAL_TABLET | Freq: Every day | ORAL | Status: DC
  Filled 2017-03-11 (×2): qty 1

## 2017-03-11 MED ORDER — FUROSEMIDE 20 MG PO TABS
20.0000 mg | ORAL_TABLET | Freq: Two times a day (BID) | ORAL | Status: DC
  Administered 2017-03-12 – 2017-03-14 (×4): 20 mg via ORAL
  Filled 2017-03-11 (×12): qty 1

## 2017-03-11 MED ORDER — NICOTINE 21 MG/24HR TD PT24
21.0000 mg | MEDICATED_PATCH | Freq: Every day | TRANSDERMAL | Status: DC
  Administered 2017-03-12 – 2017-03-13 (×2): 21 mg via TRANSDERMAL
  Filled 2017-03-11 (×7): qty 1

## 2017-03-11 MED ORDER — MIRABEGRON ER 25 MG PO TB24
25.0000 mg | ORAL_TABLET | Freq: Every day | ORAL | Status: DC
  Administered 2017-03-11 – 2017-03-14 (×4): 25 mg via ORAL
  Filled 2017-03-11 (×8): qty 1

## 2017-03-11 MED ORDER — ACETAMINOPHEN 325 MG PO TABS: 650.0000 mg | ORAL_TABLET | Freq: Four times a day (QID) | ORAL | Status: DC | PRN

## 2017-03-11 MED ORDER — PANTOPRAZOLE SODIUM 40 MG PO TBEC
40.0000 mg | DELAYED_RELEASE_TABLET | Freq: Every day | ORAL | Status: DC
  Administered 2017-03-12 – 2017-03-14 (×3): 40 mg via ORAL
  Filled 2017-03-11 (×6): qty 1

## 2017-03-11 MED ORDER — MAGNESIUM HYDROXIDE 400 MG/5ML PO SUSP: 30.0000 mL | Freq: Every day | ORAL | Status: DC | PRN

## 2017-03-11 MED ORDER — PANCRELIPASE (LIP-PROT-AMYL) 36000-114000 UNITS PO CPEP
36000.0000 [IU] | ORAL_CAPSULE | Freq: Three times a day (TID) | ORAL | Status: DC
  Administered 2017-03-11 – 2017-03-14 (×7): 36000 [IU] via ORAL
  Filled 2017-03-11 (×19): qty 1

## 2017-03-11 MED ORDER — MIRABEGRON ER 50 MG PO TB24
50.0000 mg | ORAL_TABLET | Freq: Every day | ORAL | Status: DC
  Filled 2017-03-11: qty 1

## 2017-03-11 MED ORDER — GABAPENTIN 400 MG PO CAPS
800.0000 mg | ORAL_CAPSULE | Freq: Four times a day (QID) | ORAL | Status: DC
  Administered 2017-03-11 – 2017-03-14 (×11): 800 mg via ORAL
  Filled 2017-03-11 (×24): qty 2

## 2017-03-11 MED ORDER — ASENAPINE MALEATE 5 MG SL SUBL
20.0000 mg | SUBLINGUAL_TABLET | Freq: Every day | SUBLINGUAL | Status: DC
  Administered 2017-03-11 – 2017-03-12 (×2): 20 mg via SUBLINGUAL
  Filled 2017-03-11 (×6): qty 4

## 2017-03-11 MED ORDER — FLUOXETINE HCL 20 MG PO CAPS
40.0000 mg | ORAL_CAPSULE | Freq: Every day | ORAL | Status: DC
  Administered 2017-03-12: 40 mg via ORAL
  Filled 2017-03-11 (×2): qty 2

## 2017-03-11 MED ORDER — SIMVASTATIN 40 MG PO TABS
40.0000 mg | ORAL_TABLET | Freq: Every day | ORAL | Status: DC
  Administered 2017-03-12 – 2017-03-14 (×3): 40 mg via ORAL
  Filled 2017-03-11 (×6): qty 1

## 2017-03-11 MED ORDER — OXYBUTYNIN CHLORIDE ER 15 MG PO TB24
15.0000 mg | ORAL_TABLET | Freq: Every day | ORAL | Status: DC
  Filled 2017-03-11: qty 1

## 2017-03-11 MED ORDER — HYDROXYZINE HCL 25 MG PO TABS: 25.0000 mg | ORAL_TABLET | Freq: Three times a day (TID) | ORAL | Status: DC | PRN

## 2017-03-11 MED ORDER — OXYBUTYNIN CHLORIDE ER 5 MG PO TB24
15.0000 mg | ORAL_TABLET | Freq: Every day | ORAL | Status: DC
  Administered 2017-03-11 – 2017-03-12 (×2): 15 mg via ORAL
  Filled 2017-03-11 (×8): qty 3

## 2017-03-11 MED ORDER — ALPRAZOLAM 1 MG PO TABS
1.0000 mg | ORAL_TABLET | ORAL | Status: DC
  Administered 2017-03-11 – 2017-03-12 (×3): 1 mg via ORAL
  Filled 2017-03-11 (×3): qty 1

## 2017-03-11 MED ORDER — OXYCODONE HCL 5 MG PO TABS
5.0000 mg | ORAL_TABLET | ORAL | Status: DC
  Administered 2017-03-11 – 2017-03-14 (×7): 5 mg via ORAL
  Filled 2017-03-11 (×7): qty 1

## 2017-03-11 MED ORDER — HYDROXYZINE HCL 25 MG PO TABS
25.0000 mg | ORAL_TABLET | Freq: Four times a day (QID) | ORAL | Status: DC
  Administered 2017-03-11 – 2017-03-12 (×3): 25 mg via ORAL
  Filled 2017-03-11 (×8): qty 1

## 2017-03-11 MED ORDER — ASPIRIN EC 325 MG PO TBEC
325.0000 mg | DELAYED_RELEASE_TABLET | Freq: Every day | ORAL | Status: DC
  Administered 2017-03-12 – 2017-03-14 (×3): 325 mg via ORAL
  Filled 2017-03-11 (×6): qty 1

## 2017-03-11 NOTE — Progress Notes (Signed)
Patient ID: Tammy Hines, female   DOB: Dec 14, 1960, 56 y.o.   MRN: [id number] PER STATE REGULATIONS 482.30  THIS CHART WAS REVIEWED FOR MEDICAL NECESSITY WITH RESPECT TO THE PATIENT'S ADMISSION/DURATION OF STAY.  NEXT REVIEW DATE:03/15/17  Loura HaltBARBARA Nasya Vincent, RN, BSN CASE MANAGER

## 2017-03-11 NOTE — BH Assessment (Signed)
[facility]  Per Juanetta BeetsJacqueline Norman, DO, this pt requires psychiatric hospitalization at this time.  Malva LimesLinsey Strader, RN, [facility]; [facility] will be ready to receive pt at 13:30.  Pt has signed Voluntary Admission and Consent for Treatment, as well as Consent to Release Information to her PCP and to Milagros Evenerupinder Kaur, MD, and a notification call has been placed to the latter.  [facility].  Pt's nurse, Diane, has been notified, and agrees to send original paperwork along with pt via [facility], and to call report to [phone].  [name] Canninghomas Taleyah Hillman, MA Triage Specialist [phone]

## 2017-03-11 NOTE — Progress Notes (Signed)
Initial 1:1 note Pt reported that she fell trying to get out of bed.  She states that she feel forward hit her left knee, front of neck and left shoulder.  When she stood up, she lost balance and fell backwards on her buttocks.  The fall was unwitnessed and she was not found in the floor.  Dr. Jackquline BerlinIzediuno notified and order obtained for 1:1 for fall risk and x-ray of neck.  1:1 initiated with sitter by her side.  She is currently in a wheel chair at this time.  Attempted to notify friend, Victorino DikeJennifer, X 2 without success.  We will continue to monitor the progress towards her goals.

## 2017-03-11 NOTE — Progress Notes (Signed)
Report received from admitting nurse. Patient denies SI/HI/AVH and pain at this time. Orders received and acknowledged.  Medications administered as per order. Patient verbally contracts for safety on the unit and agrees to come to staff before acting on any self harm thoughts/feelings and other needs or concerns. 15 min checks initiated for safety and patient oriented to the unit and the unit schedule. Patient signed 72 hour request for discharge at 4:20pm today, just after being admitted.

## 2017-03-11 NOTE — Progress Notes (Signed)
Vinnie LangtonGretchen is a 56 year old female being admitted voluntarily to [facility] from [facility].  She came in with suicidal ideation with plan to OD on morphine that belonged to her deceased fianc.  He passed away September 2018 and this financial stressors, depression, crying spells and suicidal ideation.  She denied HI or A/V hallucinations.  She has significant history of HTN, TIA's, Migraines, Fibromyalgia, arthritis, COPD and Hep B.  She is diagnosed with Major Depressive Disorder.  [facility] she was very argumentative and demanding.  She is upset that she has to be at the hospital, "I don't have any suicidal thoughts now and I want to leave."  Explained 72 hour request for discharge.  She continues to report severe depression and severe anxiety for years but has been worse since the death of her fiance.  She denies SI/HI or A/V hallucinations and will contract for safety on the unit.  She reported difficulty walking and uses a walker at home. Walker obtained for her to use.  She was made high fall risk, yellow bracelet placed, yellow socks, room closest to the nurses station provided and proper signage placed.  She was able to walk on the unit with walker but had to stop once to catch her breath.  She walked slow and steady with walker.  Oriented her to the unit.  Admission paperwork completed and signed.  Belongings searched and secured in locker # 41.  Skin assessment completed and noted multiple abrasions (pt reports picking at open sores) on both lower leg (no redness or drainage noted) and abrasion to mid chest without redness or drainage.  Q 15 minute checks initiated for safety.  We will monitor the progress towards her goals.

## 2017-03-11 NOTE — ED Notes (Signed)
[facility]. Pt was calm and cooperative. All belongings returned to pt who signed for same. Pt continued to feel suicidal and hopeless.

## 2017-03-11 NOTE — Progress Notes (Signed)
Psychoeducational Group Note  Date:  03/11/2017 Time:  2125  Group Topic/Focus:  Wrap-Up Group:   The focus of this group is to help patients review their daily goal of treatment and discuss progress on daily workbooks.  Participation Level: Did Not Attend  Participation Quality:  Not Applicable  Affect:  Not Applicable  Cognitive:  Not Applicable  Insight:  Not Applicable  Engagement in Group: Not Applicable  Additional Comments:  Patient did not attend group since she was at the hospital.   Hazle CocaGOODMAN, Janai Maudlin S 03/11/2017, 9:25 PM

## 2017-03-11 NOTE — ED Notes (Signed)
Reported to Dr. Sharma Tammy Hines and Dr. Patria Hines that an extra dose of Wellbutrin 300 mg was given. Pt informed and was not distressed about it.
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
## 2017-03-11 NOTE — ED Notes (Signed)
Bed: [facility] Expected date:  Expected time:  Means of arrival:  Comments: Hold for room 30 

## 2017-03-11 NOTE — Progress Notes (Signed)
Pt return to the unit after going to the ED for x-rays.  She stated that she is feeling fine and is ready to go to bed.  She is noted sitting in the day room eating a snack.  1:1 sitter is by side.  We will continue to monitor the progress towards her goals.

## 2017-03-11 NOTE — Tx Team (Signed)
Initial Treatment Plan 03/11/2017 3:34 PM [name] ZOX:[id number]    PATIENT STRESSORS: Loss of fiance Traumatic event   PATIENT STRENGTHS: Average or above average intelligence Capable of independent living Communication skills Supportive family/friends   PATIENT IDENTIFIED PROBLEMS: Unresolved grief from loss of finace  Depression  Suicide risk  "Get Well"  "Get rid of anxiety"             DISCHARGE CRITERIA:  Improved stabilization in mood, thinking, and/or behavior Motivation to continue treatment in a less acute level of care Reduction of life-threatening or endangering symptoms to within safe limits Verbal commitment to aftercare and medication compliance  PRELIMINARY DISCHARGE PLAN: Outpatient therapy Return to previous living arrangement  PATIENT/FAMILY INVOLVEMENT: This treatment plan has been presented to and reviewed with the patient, Tammy Hines.  The patient and family have been given the opportunity to ask questions and make suggestions.  Cranford MonBeaudry, Philis Doke Evans, RN 03/11/2017, 3:34 PM

## 2017-03-11 NOTE — ED Notes (Signed)
[facility] Ex[facility] date:  Expected time:  Means of arrival:  Comments: Hold for room 30

## 2017-03-12 DIAGNOSIS — M797 Fibromyalgia: Secondary | ICD-10-CM

## 2017-03-12 DIAGNOSIS — I10 Essential (primary) hypertension: Secondary | ICD-10-CM

## 2017-03-12 DIAGNOSIS — F17210 Nicotine dependence, cigarettes, uncomplicated: Secondary | ICD-10-CM

## 2017-03-12 DIAGNOSIS — G43909 Migraine, unspecified, not intractable, without status migrainosus: Secondary | ICD-10-CM

## 2017-03-12 DIAGNOSIS — F314 Bipolar disorder, current episode depressed, severe, without psychotic features: Principal | ICD-10-CM

## 2017-03-12 DIAGNOSIS — J449 Chronic obstructive pulmonary disease, unspecified: Secondary | ICD-10-CM

## 2017-03-12 MED ORDER — FLUOXETINE HCL 20 MG PO CAPS
20.0000 mg | ORAL_CAPSULE | Freq: Every day | ORAL | Status: DC
  Administered 2017-03-13 – 2017-03-14 (×2): 20 mg via ORAL
  Filled 2017-03-12 (×5): qty 1

## 2017-03-12 MED ORDER — DIAZEPAM 5 MG PO TABS
10.0000 mg | ORAL_TABLET | Freq: Every day | ORAL | Status: DC
  Administered 2017-03-12 – 2017-03-14 (×2): 10 mg via ORAL
  Filled 2017-03-12 (×2): qty 2

## 2017-03-12 NOTE — H&P (Signed)
Psychiatric Admission Assessment Adult  Patient Identification: Tammy Hines MRN:  [id number] Date of Evaluation:  03/12/2017 Chief Complaint:  Suicidal thoughts Principal Diagnosis: Bipolar Disorder  Diagnosis:   Patient Active Problem List   Diagnosis Date Noted  . MDD (major depressive disorder), recurrent severe, without psychosis ([place]) [F33.2] 03/11/2017  . Bipolar 1 disorder, depressed, severe ([place]) [F31.4] 03/11/2017  . Acute encephalopathy [G93.40] 11/28/2016  . Stroke (cerebrum) ([place]) [I63.9]   . Acute ischemic stroke ([place]) [I63.9] 06/24/2016  . Bipolar disorder ([place]) [F31.9] 06/24/2016  . Hypertension [I10] 06/24/2016  . Hyperlipidemia [E78.5] 06/24/2016  . Anxiety [F41.9] 06/24/2016  . Facial droop [R29.810]   . GERD without esophagitis [K21.9]   . SORE THROAT [J02.9] 12/04/2008  . VIRAL URI [J06.9] 03/04/2008  . BLURRED VISION [H53.8] 10/22/2007  . FATIGUE [R53.81, R53.83] 10/22/2007  . DISC DISEASE, LUMBAR [M51.37] 09/06/2007  . COUGH [R05] 04/20/2007  . Depression [F32.9] 11/19/2006  . Asthma [J45.909] 11/19/2006  . GERD [K21.9] 11/19/2006  . HEADACHE [R51] 11/19/2006  . HEPATITIS B, HX OF [Z86.19] 11/19/2006   History of Present Illness:  56 y.o Caucasian female, single, lives with her cats, on SSID. Background history of Bipolar disorder and multiple medical issues. Presented to the ER via EMS. Her friend called because patient had expressed thoughts of suicide. She had asked for someone to take good care of her cats. Patient was reported to have taken an overdose of 10-15 oxycodone. She had already taken an extra dose of Xanax . Patient reports relationship with her mother as the main stressor.  Routine labs is significant for slightly elevated WBCC and mild hyponatremia., toxicology is negative,  UDS is positive for benzodiazepines. No alcohol.  At interview, patient tells me that she had been doing okay until she had an argument with her mom. Says her mom  is the reason she is here  " I called her some days ago and she would not pick the phone ,,,, she finally picked it, told me she was busy and slammed her phone,,,,,, there are times I need to talk with people ,,,,, my mom called later and we agreed to go out to eat ,,,,,, she started arguing with me over the route to [facility] ,,,,, she told me she would never call me again and left". Patient says she felt very down and instantly felt worthless. Says she started ruminating on the death of her fiancee a couple of months ago. Says she was treated negatively by her fiancee when he was alive. Patient says she then decided she would go home and overdose on his left over Morphine. Patient denies taking any overdose as reported. Tells me that now she is around people, she is feeling okay. Says she is no longer having any suicidal thoughts. Patient states that she sleeps well at night once she takes her mood stabilizer. Says she has been eating normally. No abnormal perception. No abnormal belief. Patient has a history of falling at home. Patient fell here yesterday. She is on a lot of sedating medications. I discussed the risk of taking benzodiazepines on a chronic basis. I discussed the negative interaction it has with opioids. We agreed to switch her to a longer acting benzodiazepine. Patient encouraged to gradually wean off benzodiazepines. No use of illicit substances. No violent thoughts. No homicidal thoughts. No access to weapons.   Total Time spent with patient: 1 hour  Past Psychiatric History: Long history of Bipolar Disorder. She is currently on Fluoxetine 40 mg  daily, Asenapine 20 mg at bedtime, Bupropion XL 450 mg daily, Xanax 2 mg TID, Gabapentin 800 mg QID. This is her first admission here. She is followed by Dr. Toy Care in the community. History of self harm by cutting six years ago. No past history of violent behavior  Is the patient at risk to self? No.  Has the patient been a risk to self in the past 6  months? Yes.    Has the patient been a risk to self within the distant past? No.  Is the patient a risk to others? No.  Has the patient been a risk to others in the past 6 months? No.  Has the patient been a risk to others within the distant past? No.   Prior Inpatient Therapy:   Prior Outpatient Therapy:    Alcohol Screening: 1. How often do you have a drink containing alcohol?: 2 to 3 times a week 2. How many drinks containing alcohol do you have on a typical day when you are drinking?: 1 or 2 3. How often do you have six or more drinks on one occasion?: Never AUDIT-C Score: 3 4. How often during the last year have you found that you were not able to stop drinking once you had started?: Never 5. How often during the last year have you failed to do what was normally expected from you becasue of drinking?: Never 6. How often during the last year have you needed a first drink in the morning to get yourself going after a heavy drinking session?: Never 7. How often during the last year have you had a feeling of guilt of remorse after drinking?: Never 8. How often during the last year have you been unable to remember what happened the night before because you had been drinking?: Never 9. Have you or someone else been injured as a result of your drinking?: No 10. Has a relative or friend or a doctor or another health worker been concerned about your drinking or suggested you cut down?: No Alcohol Use Disorder Identification Test Final Score (AUDIT): 3 Intervention/Follow-up: AUDIT Score <7 follow-up not indicated Substance Abuse History in the last 12 months:  As above  Consequences of Substance Abuse: As above Previous Psychotropic Medications: Yes  Psychological Evaluations: No  Past Medical History:  Past Medical History:  Diagnosis Date  . Anxiety   . Arthritis    "right knee, left hip, going down my neck into my right shoulder" (06/24/2016)  . Asthma   . Bipolar 1 disorder ([place])   .  Chronic lower back pain   . Chronic neck pain   . COPD (chronic obstructive pulmonary disease) ([place])   . Depression    Archie Endo 06/24/2016  . Family history of adverse reaction to anesthesia    "makes my mom throw up"   . Fibromyalgia   . GERD (gastroesophageal reflux disease)    Archie Endo 06/24/2016  . Hepatitis B   . History of hiatal hernia   . History of stomach ulcers   . History of transient ischemic attack (TIA)    /RN 06/24/2016  . Hyperlipidemia   . Hypertension   . Ischemic stroke ([place])    acute/notes 06/24/2016; "left sided weakness"/RN (06/24/2016  . Migraine    "a few/year now" (06/24/2016)  . Obesity   . PONV (postoperative nausea and vomiting)     Past Surgical History:  Procedure Laterality Date  . ANAL FISSURE REPAIR    . FRACTURE SURGERY    .  LOOP RECORDER INSERTION N/A 06/26/2016   Procedure: Loop Recorder Insertion;  Surgeon: Thompson Grayer, MD;  Location: [facility];  Service: Cardiovascular;  Laterality: N/A;  . MULTIPLE TOOTH EXTRACTIONS  2018   "I had 6 teeth pulled"  . SHOULDER ARTHROSCOPY W/ ROTATOR CUFF REPAIR Right 2000  . SHOULDER SURGERY Right 1999  . TEE WITHOUT CARDIOVERSION N/A 06/26/2016   Procedure: TRANSESOPHAGEAL ECHOCARDIOGRAM (TEE);  Surgeon: Sanda Klein, MD;  Location: [facility] ENDOSCOPY;  Service: Cardiovascular;  Laterality: N/A;   Family History:  Family History  Problem Relation Age of Onset  . Hypertension Mother   . Cancer Father        esophageal  . Heart attack Maternal Grandfather    Family Psychiatric  History: None Tobacco Screening: Have you used any form of tobacco in the last 30 days? (Cigarettes, Smokeless Tobacco, Cigars, and/or Pipes): Yes Tobacco use, Select all that apply: 5 or more cigarettes per day Are you interested in Tobacco Cessation Medications?: Yes, will notify MD for an order Counseled patient on smoking cessation including recognizing danger situations, developing coping skills and basic information about  quitting provided: Refused/Declined practical counseling Social History:  Social History   Substance and Sexual Activity  Alcohol Use Yes  . Alcohol/week: 0.6 oz  . Types: 1 Glasses of wine per week   Comment: "I drank one smironoff ice before I came) 12/04     Social History   Substance and Sexual Activity  Drug Use No    Additional Social History:     Never been married, no kids. No current relationship. Celesta Gentile passed less than three months ago.  Allergies:   Allergies  Allergen Reactions  . Ampicillin Hives and Nausea And Vomiting    Has patient had a PCN reaction causing immediate rash, facial/tongue/throat swelling, SOB or lightheadedness with hypotension: No Has patient had a PCN reaction causing severe rash involving mucus membranes or skin necrosis: No Has patient had a PCN reaction that required hospitalization No Has patient had a PCN reaction occurring within the last 10 years: No If all of the above answers are "NO", then may proceed with Cephalosporin use.   Marland Kitchen Cleocin [Clindamycin Hcl]     "deathly sick"  . Clonazepam Other (See Comments)    Pt does not remember what the reaction was  . Other Other (See Comments)    Allergy to mold, down and feathers per allergy test  . Penicillins Other (See Comments)    Has patient had a PCN reaction causing immediate rash, facial/tongue/throat swelling, SOB or lightheadedness with hypotension: No Has patient had a PCN reaction causing severe rash involving mucus membranes or skin necrosis: No Has patient had a PCN reaction that required hospitalization No Has patient had a PCN reaction occurring within the last 10 years: No If all of the above answers are "NO", then may proceed with Cephalosporin use.   Pt told not to take because of reaction to ampicillin - no   Lab Results:  Results for orders placed or performed during the hospital encounter of 03/10/17 (from the past 48 hour(s))  Comprehensive metabolic panel      Status: Abnormal   Collection Time: 03/10/17  5:45 PM  Result Value Ref Range   Sodium 137 135 - 145 mmol/L   Potassium 3.4 (L) 3.5 - 5.1 mmol/L   Chloride 100 (L) 101 - 111 mmol/L   CO2 28 22 - 32 mmol/L   Glucose, Bld 86 65 - 99 mg/dL  BUN 13 6 - 20 mg/dL   Creatinine, Ser 0.86 0.44 - 1.00 mg/dL   Calcium 9.3 8.9 - 10.3 mg/dL   Total Protein 7.7 6.5 - 8.1 g/dL   Albumin 3.9 3.5 - 5.0 g/dL   AST 32 15 - 41 U/L   ALT 29 14 - 54 U/L   Alkaline Phosphatase 104 38 - 126 U/L   Total Bilirubin 0.5 0.3 - 1.2 mg/dL   GFR calc non Af Amer >60 >60 mL/min   GFR calc Af Amer >60 >60 mL/min    Comment: (NOTE) The eGFR has been calculated using the CKD EPI equation. This calculation has not been validated in all clinical situations. eGFR's persistently <60 mL/min signify possible Chronic Kidney Disease.    Anion gap 9 5 - 15  Ethanol     Status: None   Collection Time: 03/10/17  5:45 PM  Result Value Ref Range   Alcohol, Ethyl (B) <10 <10 mg/dL    Comment:        LOWEST DETECTABLE LIMIT FOR SERUM ALCOHOL IS 10 mg/dL FOR MEDICAL PURPOSES ONLY   Salicylate level     Status: None   Collection Time: 03/10/17  5:45 PM  Result Value Ref Range   Salicylate Lvl <4.3 2.8 - 30.0 mg/dL  Acetaminophen level     Status: Abnormal   Collection Time: 03/10/17  5:45 PM  Result Value Ref Range   Acetaminophen (Tylenol), Serum <10 (L) 10 - 30 ug/mL    Comment:        THERAPEUTIC CONCENTRATIONS VARY SIGNIFICANTLY. A RANGE OF 10-30 ug/mL MAY BE AN EFFECTIVE CONCENTRATION FOR MANY PATIENTS. HOWEVER, SOME ARE BEST TREATED AT CONCENTRATIONS OUTSIDE THIS RANGE. ACETAMINOPHEN CONCENTRATIONS >150 ug/mL AT 4 HOURS AFTER INGESTION AND >50 ug/mL AT 12 HOURS AFTER INGESTION ARE OFTEN ASSOCIATED WITH TOXIC REACTIONS.   cbc     Status: Abnormal   Collection Time: 03/10/17  5:45 PM  Result Value Ref Range   WBC 12.5 (H) 4.0 - 10.5 K/uL   RBC 4.94 3.87 - 5.11 MIL/uL   Hemoglobin 12.9 12.0 - 15.0 g/dL    HCT 40.8 36.0 - 46.0 %   MCV 82.6 78.0 - 100.0 fL   MCH 26.1 26.0 - 34.0 pg   MCHC 31.6 30.0 - 36.0 g/dL   RDW 16.7 (H) 11.5 - 15.5 %   Platelets 291 150 - 400 K/uL  I-Stat beta hCG blood, ED     Status: Abnormal   Collection Time: 03/10/17  5:55 PM  Result Value Ref Range   I-stat hCG, quantitative 7.0 (H) <5 mIU/mL   Comment 3            Comment:   GEST. AGE      CONC.  (mIU/mL)   <=1 WEEK        5 - 50     2 WEEKS       50 - 500     3 WEEKS       100 - 10,000     4 WEEKS     1,000 - 30,000        FEMALE AND NON-PREGNANT FEMALE:     LESS THAN 5 mIU/mL   Rapid urine drug screen (hospital performed)     Status: Abnormal   Collection Time: 03/10/17 10:30 PM  Result Value Ref Range   Opiates NONE DETECTED NONE DETECTED   Cocaine NONE DETECTED NONE DETECTED   Benzodiazepines POSITIVE (A) NONE DETECTED   Amphetamines  NONE DETECTED NONE DETECTED   Tetrahydrocannabinol NONE DETECTED NONE DETECTED   Barbiturates NONE DETECTED NONE DETECTED    Comment:        DRUG SCREEN FOR MEDICAL PURPOSES ONLY.  IF CONFIRMATION IS NEEDED FOR ANY PURPOSE, NOTIFY LAB WITHIN 5 DAYS.        LOWEST DETECTABLE LIMITS FOR URINE DRUG SCREEN Drug Class       Cutoff (ng/mL) Amphetamine      1000 Barbiturate      200 Benzodiazepine   518 Tricyclics       841 Opiates          300 Cocaine          300 THC              50   Urinalysis, Routine w reflex microscopic     Status: Abnormal   Collection Time: 03/11/17  9:55 AM  Result Value Ref Range   Color, Urine YELLOW YELLOW   APPearance CLEAR CLEAR   Specific Gravity, Urine 1.006 1.005 - 1.030   pH 7.0 5.0 - 8.0   Glucose, UA NEGATIVE NEGATIVE mg/dL   Hgb urine dipstick SMALL (A) NEGATIVE   Bilirubin Urine NEGATIVE NEGATIVE   Ketones, ur NEGATIVE NEGATIVE mg/dL   Protein, ur NEGATIVE NEGATIVE mg/dL   Nitrite NEGATIVE NEGATIVE   Leukocytes, UA NEGATIVE NEGATIVE   RBC / HPF 0-5 0 - 5 RBC/hpf   WBC, UA 0-5 0 - 5 WBC/hpf   Bacteria, UA RARE (A)  NONE SEEN   Squamous Epithelial / LPF 0-5 (A) NONE SEEN    Blood Alcohol level:  Lab Results  Component Value Date   ETH <10 03/10/2017   ETH <5 66/09/3014    Metabolic Disorder Labs:  Lab Results  Component Value Date   HGBA1C 6.2 (H) 06/25/2016   MPG 131 06/25/2016   No results found for: PROLACTIN Lab Results  Component Value Date   CHOL 177 06/25/2016   TRIG 207 (H) 06/25/2016   HDL 38 (L) 06/25/2016   CHOLHDL 4.7 06/25/2016   VLDL 41 (H) 06/25/2016   LDLCALC 98 06/25/2016    Current Medications: Current Facility-Administered Medications  Medication Dose Route Frequency Provider Last Rate Last Dose  . acetaminophen (TYLENOL) tablet 650 mg  650 mg Oral Q6H PRN Ethelene Hal, NP      . albuterol (PROVENTIL HFA;VENTOLIN HFA) 108 (90 Base) MCG/ACT inhaler 1-2 puff  1-2 puff Inhalation Q6H PRN Ethelene Hal, NP      . alum & mag hydroxide-simeth (MAALOX/MYLANTA) 200-200-20 MG/5ML suspension 30 mL  30 mL Oral Q4H PRN Ethelene Hal, NP      . asenapine (SAPHRIS) sublingual tablet 20 mg  20 mg Sublingual QHS Ethelene Hal, NP   20 mg at 03/11/17 2204  . aspirin EC tablet 325 mg  325 mg Oral Daily Ethelene Hal, NP   325 mg at 03/12/17 0825  . atenolol (TENORMIN) tablet 100 mg  100 mg Oral QHS Ethelene Hal, NP   100 mg at 03/11/17 2204  . buPROPion (WELLBUTRIN XL) 24 hr tablet 450 mg  450 mg Oral Daily Ethelene Hal, NP   450 mg at 03/12/17 0109  . diazepam (VALIUM) tablet 10 mg  10 mg Oral QHS Janalyn Higby A, MD      . dicyclomine (BENTYL) capsule 10 mg  10 mg Oral TID AC Ethelene Hal, NP   10 mg at 03/12/17 0656  . [START  ON 03/13/2017] FLUoxetine (PROZAC) capsule 20 mg  20 mg Oral Daily Helane Briceno A, MD      . furosemide (LASIX) tablet 20 mg  20 mg Oral BID Ethelene Hal, NP   20 mg at 03/12/17 0825  . gabapentin (NEURONTIN) capsule 800 mg  800 mg Oral QID Ethelene Hal, NP   800 mg at  03/12/17 9381  . lipase/protease/amylase (CREON) capsule 36,000 Units  36,000 Units Oral TID WC Ethelene Hal, NP   36,000 Units at 03/12/17 [phone]  . magnesium hydroxide (MILK OF MAGNESIA) suspension 30 mL  30 mL Oral Daily PRN Ethelene Hal, NP      . mirabegron ER [facility]) tablet 25 mg  25 mg Oral Daily Ethelene Hal, NP   25 mg at 03/11/17 1812  . naproxen (NAPROSYN) tablet 500 mg  500 mg Oral BID PRN Ethelene Hal, NP      . nicotine (NICODERM CQ - dosed in mg/24 hours) patch 21 mg  21 mg Transdermal Daily Cobos, Myer Peer, MD   21 mg at 03/12/17 0824  . oxybutynin (DITROPAN-XL) 24 hr tablet 15 mg  15 mg Oral QHS Ethelene Hal, NP   15 mg at 03/11/17 2246  . oxyCODONE-acetaminophen (PERCOCET/ROXICET) 5-325 MG per tablet 1 tablet  1 tablet Oral 3 times per day Ethelene Hal, NP   1 tablet at 03/12/17 3716   And  . oxyCODONE (Oxy IR/ROXICODONE) immediate release tablet 5 mg  5 mg Oral 3 times per day Ethelene Hal, NP   5 mg at 03/12/17 0656  . pantoprazole (PROTONIX) EC tablet 40 mg  40 mg Oral Daily Ethelene Hal, NP   40 mg at 03/12/17 0825  . simvastatin (ZOCOR) tablet 40 mg  40 mg Oral Daily Ethelene Hal, NP   40 mg at 03/12/17 0825   PTA Medications: Medications Prior to Admission  Medication Sig Dispense Refill Last Dose  . alprazolam (XANAX) 2 MG tablet Take 2 mg by mouth 3 (three) times daily.    03/10/2017 at Unknown time  . Asenapine Maleate (SAPHRIS) 10 MG SUBL Place 20 mg under the tongue at bedtime.   03/09/2017 at Unknown time  . aspirin EC 325 MG EC tablet Take 1 tablet (325 mg total) by mouth daily. 30 tablet 0 03/10/2017 at Unknown time  . atenolol (TENORMIN) 100 MG tablet Take 1 tablet (100 mg total) by mouth at bedtime.   03/10/2017 at 0300  . buPROPion (WELLBUTRIN XL) 150 MG 24 hr tablet Take 150 mg by mouth daily. Take with wellbutrin xl 300 to equal 450 mg   03/10/2017 at Unknown time  . buPROPion  (WELLBUTRIN XL) 300 MG 24 hr tablet Take 300 mg by mouth daily. Take with wellbutrin xl 150 mg to equal 450 mg   03/10/2017 at Unknown time  . CREON 36000 units CPEP capsule Take 36,000 Units by mouth 3 (three) times daily with meals.    03/10/2017 at Unknown time  . dicyclomine (BENTYL) 10 MG capsule Take 10 mg by mouth 3 (three) times daily before meals.    03/10/2017 at Unknown time  . FLUoxetine (PROZAC) 40 MG capsule Take 40 mg by mouth 3 (three) times daily.    03/10/2017 at Unknown time  . furosemide (LASIX) 20 MG tablet Take 20 mg by mouth 2 (two) times daily.    03/10/2017 at Unknown time  . gabapentin (NEURONTIN) 800 MG tablet Take 800 mg by mouth  4 (four) times daily.   03/10/2017 at Unknown time  . hydrOXYzine (VISTARIL) 25 MG capsule Take 25 mg by mouth 4 (four) times daily.   03/10/2017 at Unknown time  . MYRBETRIQ 50 MG TB24 tablet Take 1 tablet by mouth at bedtime.     . naproxen (NAPROSYN) 500 MG tablet Take 1 tablet (500 mg total) by mouth 2 (two) times daily as needed for mild pain or moderate pain.   Past Month at Unknown time  . oxybutynin (DITROPAN XL) 15 MG 24 hr tablet Take 15 mg by mouth at bedtime.    03/09/2017 at Unknown time  . oxyCODONE-acetaminophen (PERCOCET) 10-325 MG tablet Take 1 tablet by mouth 3 (three) times daily. Scheduled 7a, 12p, 5p   03/10/2017 at Unknown time  . pantoprazole (PROTONIX) 40 MG tablet Take 40 mg by mouth daily.    03/10/2017 at Unknown time  . simvastatin (ZOCOR) 20 MG tablet Take 2 tablets (40 mg total) by mouth at bedtime. (Patient not taking: Reported on 11/29/2016) 60 tablet 0 Not Taking at Unknown time  . simvastatin (ZOCOR) 40 MG tablet Take 40 mg by mouth daily.   03/10/2017 at Unknown time  . VENTOLIN HFA 108 (90 Base) MCG/ACT inhaler Inhale 1-2 puffs into the lungs every 6 (six) hours as needed for wheezing.    Past Month at Unknown time    Musculoskeletal: Strength & Muscle Tone: within normal limits Gait & Station: Mobilizes with a  walker Patient leans: N/A  Psychiatric Specialty Exam: Physical Exam  Constitutional: She is oriented to person, place, and time. No distress.  HENT:  Head: Normocephalic and atraumatic.  Respiratory: Effort normal.  Neurological: She is alert and oriented to person, place, and time.  Skin: She is not diaphoretic.  Psychiatric:  As above     ROS  Blood pressure 123/62, pulse 64, temperature 97.8 F (36.6 C), temperature source Oral, resp. rate 16, height '5\' 7"'  (1.702 m), weight 112.9 kg (249 lb), SpO2 94 %.Body mass index is 39 kg/m.  General Appearance: Overweight, casually dressed, some underlying irritability.  Eye Contact:  Good  Speech:  Pressured  Volume:  Normal  Mood:  Dysphoric and Irritable  Affect:  Congruent  Thought Process:  circumstantial   Orientation:  Full (Time, Place, and Person)  Thought Content:  Ruminations about her relationship with mom. No thoughts of violence. No hallucination in any modality.   Suicidal Thoughts:  None since she has been here  Homicidal Thoughts:  No  Memory:  Immediate;   Good Recent;   Fair Remote;   Good  Judgement:  Fair  Insight:  Fair  Psychomotor Activity:  Normal  Concentration:  Concentration: Good and Attention Span: Good  Recall:  [place] of Knowledge:  Good  Language:  Good  Akathisia:  Negative  Handed:    AIMS (if indicated):     Assets:  Communication Skills Desire for Improvement Financial Resources/Insurance Housing Resilience  ADL's:  Intact  Cognition:  WNL  Sleep:  Number of Hours: 6    Treatment Plan Summary: Patient is hypomanic. She is easily irritated and impulsive. She became suicidal after she felt rejected by her mother. She is no longer suicidal. We agreed to decrease Prozac to 20 mg daily. Repeated falls is related to multiple medications that are sedating. We have agreed to wean her off benzodiazepines. We switched her to longer acting Valium and would cut down gradually. We would  monitor her response.  Psychiatric: Bipolar  Disorder ? cluster B- traits  Medical: HTN CVD Migraine COPD HBV Fibromyalgia  Psychosocial:  Multiple medical issues  PLAN: 1. Decrease Fluoxetine to 20 mg daily 2. Hold Alprazolam.  3. Valium 10 mg at bedtime. Would gradually wean off 4. Continue Asenapine 20 mg at bedtime 5. Continue Gabapentin 800 mg QID 6. Continue Bupropion XL 450 mg daily 7. Continue other medical medications at current dose 8. Encourage unit groups and activities 9. Monitor mood, behavior and interaction with peers 10. SW would gather collateral from her family and coordinate aftercare   Observation Level/Precautions:  Fall 1 to 1 15 minute checks  Laboratory:    Psychotherapy:    Medications:    Consultations:    Discharge Concerns:    Estimated LOS:  Other:     Physician Treatment Plan for Primary Diagnosis: <principal problem not specified> Long Term Goal(s): Improvement in symptoms so as ready for discharge  Short Term Goals: Ability to identify changes in lifestyle to reduce recurrence of condition will improve, Ability to verbalize feelings will improve, Ability to disclose and discuss suicidal ideas, Ability to demonstrate self-control will improve, Ability to identify and develop effective coping behaviors will improve, Ability to maintain clinical measurements within normal limits will improve and Compliance with prescribed medications will improve  Physician Treatment Plan for Secondary Diagnosis: Active Problems:   Bipolar 1 disorder, depressed, severe ([place])  Long Term Goal(s): Improvement in symptoms so as ready for discharge  Short Term Goals: Ability to identify changes in lifestyle to reduce recurrence of condition will improve, Ability to verbalize feelings will improve, Ability to disclose and discuss suicidal ideas, Ability to demonstrate self-control will improve, Ability to identify and develop effective coping behaviors will  improve, Ability to maintain clinical measurements within normal limits will improve and Compliance with prescribed medications will improve  I certify that inpatient services furnished can reasonably be expected to improve the patient's condition.    [place], MD 12/6/201811:09 AM

## 2017-03-12 NOTE — Progress Notes (Signed)
1:1 note: Tammy Hines is currently in her room asleep.  Sitter remains by her side.  She came back from x-ray saying that she was "feeling fine and doesn't hurt anywhere."  She denies any SI/HI or A/V hallucinations.  She requested to take her hs medications and go back to bed.  Q 15 minute checks maintained for safety.  Sitter remains for safety.  Tammy Hines remains safe on the unit.  We will monitor the progress towards her goals.
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
## 2017-03-12 NOTE — Progress Notes (Signed)
D: Pt was in the dayroom upon initial approach.  Pt presents with depressed affect and mood.  She has been ambulating with her walker tonight.  She reports her day has "been good" and her goal was to "talk to people and meet new people."  Reports she met goal.  Pt denies SI/HI, denies hallucinations.  Pt has been visible in milieu interacting with peers and staff appropriately.  Pt attended evening group.    A: Introduced self to pt.  Actively listened to pt and offered support and encouragement. Medications administered per order.  1:1 staff remains with pt for safety.   R: Pt is safe on the unit.  Pt is compliant with medications.  Pt verbally contracts for safety.  Will continue to monitor and assess.   

## 2017-03-12 NOTE — Progress Notes (Signed)
1:1 Note: Patient is maintained on constant supervision for safety.  Patient is visible in the dayroom with minimal interaction with staff and peers.  Medications given as prescribed.  Routine safety checks maintained.  Patient is safe on the unit with supervision.

## 2017-03-12 NOTE — Progress Notes (Signed)
1:1 Note: Patient presents with flat affect and depressed mood.  Patient is seen in the dayroom with minimal interaction.  Patient ambulatory on the unit with walker.  Denies suicidal thoughts, auditory and visual hallucinations.  Medications given as prescribed.  Routine safety checks maintained.  Patient is safe on the unit.

## 2017-03-12 NOTE — Progress Notes (Signed)
D: Pt is resting in bed with eyes closed.  Respirations are even and unlabored.  No distress noted.    A: 1:1 staff remains with pt for safety.   R: Pt is safe on the unit.  Will continue to monitor and assess.  

## 2017-03-12 NOTE — Progress Notes (Signed)
Pt attended evening wrap up group and stated that today was a 7, she mentioned her day was made when she received gum as a gift from another patient. Overall her day went well.

## 2017-03-12 NOTE — Progress Notes (Signed)
1:1 Note: Patient maintained on constant supervision for safety.  Patient in the dayroom with no interaction.  Patient ambulating on the unit with walker.  Routine safety checks maintained.  Medications given as prescribed.  Support and encouragement offered as needed.  Patient is safe on the unit with supervision.
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
## 2017-03-12 NOTE — Progress Notes (Signed)
D: Report received from previous RN.  Pt is resting in bed with eyes closed.  Respirations are even and unlabored.  No distress noted.  A: 1:1 staff remains with pt for safety.  R: Pt is safe on the unit.  Will continue to monitor and assess.

## 2017-03-13 ENCOUNTER — Other Ambulatory Visit: Payer: Self-pay

## 2017-03-13 ENCOUNTER — Emergency Department (HOSPITAL_COMMUNITY)
Admission: EM | Admit: 2017-03-13 | Discharge: 2017-03-20 | Discharge status: Absent without leave | Payer: Medicare Other | Source: Home / Self Care | Admitting: Psychiatry

## 2017-03-13 ENCOUNTER — Encounter (HOSPITAL_COMMUNITY): Payer: Self-pay

## 2017-03-13 DIAGNOSIS — R451 Restlessness and agitation: Secondary | ICD-10-CM

## 2017-03-13 DIAGNOSIS — F419 Anxiety disorder, unspecified: Secondary | ICD-10-CM

## 2017-03-13 DIAGNOSIS — F39 Unspecified mood [affective] disorder: Secondary | ICD-10-CM

## 2017-03-13 DIAGNOSIS — R450 Nervousness: Secondary | ICD-10-CM

## 2017-03-13 LAB — BASIC METABOLIC PANEL
Anion gap: 7 (ref 5–15)
BUN: 15 mg/dL (ref 6–20)
CHLORIDE: 105 mmol/L (ref 101–111)
CO2: 27 mmol/L (ref 22–32)
CREATININE: 0.75 mg/dL (ref 0.44–1.00)
Calcium: 9 mg/dL (ref 8.9–10.3)
GFR calc non Af Amer: >60 mL/min (ref >60)
Glucose, Bld: 110 mg/dL — ABNORMAL HIGH (ref 65–99)
Potassium: 3.8 mmol/L (ref 3.5–5.1)
Sodium: 139 mmol/L (ref 135–145)

## 2017-03-13 LAB — CBC
HCT: 38.5 % (ref 36.0–46.0)
Hemoglobin: 12.3 g/dL (ref 12.0–15.0)
MCH: 26.2 pg (ref 26.0–34.0)
MCHC: 31.9 g/dL (ref 30.0–36.0)
MCV: 81.9 fL (ref 78.0–100.0)
PLATELETS: 278 10*3/uL (ref 150–400)
RBC: 4.7 MIL/uL (ref 3.87–5.11)
RDW: 16.7 % — ABNORMAL HIGH (ref 11.5–15.5)
WBC: 11 10*3/uL — ABNORMAL HIGH (ref 4.0–10.5)

## 2017-03-13 LAB — GLUCOSE, CAPILLARY: Glucose-Capillary: 129 mg/dL — ABNORMAL HIGH (ref 65–99)

## 2017-03-13 LAB — CBG MONITORING, ED: Glucose-Capillary: 104 mg/dL — ABNORMAL HIGH (ref 65–99)

## 2017-03-13 NOTE — Progress Notes (Addendum)
1:1 Note 1800  Patient waiting in dayroom with 1:1 present. 1700 medications not given patient because she is nauseated.  Ginger ale given. [facility]   [facility].

## 2017-03-13 NOTE — Tx Team (Signed)
Interdisciplinary Treatment and Diagnostic Plan Update  03/13/2017 Time of Session: 0830AM Tammy JarvisGretchen K Hines MRN: [id number]  Principal Diagnosis: Bipolar 1 disorder, depressed, severe (HCC)  Secondary Diagnoses: Principal Problem:   Bipolar 1 disorder, depressed, severe (HCC)   Current Medications:  Current Facility-Administered Medications  Medication Dose Route Frequency Provider Last Rate Last Dose  . acetaminophen (TYLENOL) tablet 650 mg  650 mg Oral Q6H PRN Laveda AbbeParks, Laurie Britton, NP      . albuterol (PROVENTIL HFA;VENTOLIN HFA) 108 (90 Base) MCG/ACT inhaler 1-2 puff  1-2 puff Inhalation Q6H PRN Laveda AbbeParks, Laurie Britton, NP      . alum & mag hydroxide-simeth (MAALOX/MYLANTA) 200-200-20 MG/5ML suspension 30 mL  30 mL Oral Q4H PRN Laveda AbbeParks, Laurie Britton, NP      . asenapine (SAPHRIS) sublingual tablet 20 mg  20 mg Sublingual QHS Laveda AbbeParks, Laurie Britton, NP   20 mg at 03/12/17 2111  . aspirin EC tablet 325 mg  325 mg Oral Daily Laveda AbbeParks, Laurie Britton, NP   325 mg at 03/13/17 [id number]  . atenolol (TENORMIN) tablet 100 mg  100 mg Oral QHS Laveda AbbeParks, Laurie Britton, NP   100 mg at 03/12/17 2110  . buPROPion (WELLBUTRIN XL) 24 hr tablet 450 mg  450 mg Oral Daily Laveda AbbeParks, Laurie Britton, NP   450 mg at 03/13/17 [id number]  . diazepam (VALIUM) tablet 10 mg  10 mg Oral QHS Izediuno, Delight OvensVincent A, MD   10 mg at 03/12/17 2111  . dicyclomine (BENTYL) capsule 10 mg  10 mg Oral TID AC Laveda AbbeParks, Laurie Britton, NP   10 mg at 03/13/17 0600  . FLUoxetine (PROZAC) capsule 20 mg  20 mg Oral Daily Izediuno, Delight OvensVincent A, MD   20 mg at 03/13/17 0839  . furosemide (LASIX) tablet 20 mg  20 mg Oral BID Laveda AbbeParks, Laurie Britton, NP   20 mg at 03/13/17 [id number]  . gabapentin (NEURONTIN) capsule 800 mg  800 mg Oral QID Laveda AbbeParks, Laurie Britton, NP   800 mg at 03/13/17 [id number]  . lipase/protease/amylase (CREON) capsule 36,000 Units  36,000 Units Oral TID WC Laveda AbbeParks, Laurie Britton, NP   36,000 Units at 03/13/17 0559  . magnesium hydroxide (MILK OF MAGNESIA)  suspension 30 mL  30 mL Oral Daily PRN Laveda AbbeParks, Laurie Britton, NP      . mirabegron [facility](MYRBETRIQ) tablet 25 mg  25 mg Oral Daily Laveda AbbeParks, Laurie Britton, NP   25 mg at 03/12/17 1710  . naproxen (NAPROSYN) tablet 500 mg  500 mg Oral BID PRN Laveda AbbeParks, Laurie Britton, NP      . nicotine (NICODERM CQ - dosed in mg/24 hours) patch 21 mg  21 mg Transdermal Daily Cobos, Rockey SituFernando A, MD   21 mg at 03/13/17 0839  . oxybutynin (DITROPAN-XL) 24 hr tablet 15 mg  15 mg Oral QHS Laveda AbbeParks, Laurie Britton, NP   15 mg at 03/12/17 2110  . oxyCODONE-acetaminophen (PERCOCET/ROXICET) 5-325 MG per tablet 1 tablet  1 tablet Oral 3 times per day Laveda AbbeParks, Laurie Britton, NP   1 tablet at 03/13/17 0559   And  . oxyCODONE (Oxy IR/ROXICODONE) immediate release tablet 5 mg  5 mg Oral 3 times per day Laveda AbbeParks, Laurie Britton, NP   5 mg at 03/13/17 0559  . pantoprazole (PROTONIX) EC tablet 40 mg  40 mg Oral Daily Laveda AbbeParks, Laurie Britton, NP   40 mg at 03/13/17 [id number]  . simvastatin (ZOCOR) tablet 40 mg  40 mg Oral Daily Laveda AbbeParks, Laurie Britton, NP   40 mg at 03/13/17 [phone]  PTA Medications: Medications Prior to Admission  Medication Sig Dispense Refill Last Dose  . alprazolam (XANAX) 2 MG tablet Take 2 mg by mouth 3 (three) times daily.    03/10/2017 at Unknown time  . Asenapine Maleate (SAPHRIS) 10 MG SUBL Place 20 mg under the tongue at bedtime.   03/09/2017 at Unknown time  . aspirin EC 325 MG EC tablet Take 1 tablet (325 mg total) by mouth daily. 30 tablet 0 03/10/2017 at Unknown time  . atenolol (TENORMIN) 100 MG tablet Take 1 tablet (100 mg total) by mouth at bedtime.   03/10/2017 at 0300  . buPROPion (WELLBUTRIN XL) 150 MG 24 hr tablet Take 150 mg by mouth daily. Take with wellbutrin xl 300 to equal 450 mg   03/10/2017 at Unknown time  . buPROPion (WELLBUTRIN XL) 300 MG 24 hr tablet Take 300 mg by mouth daily. Take with wellbutrin xl 150 mg to equal 450 mg   03/10/2017 at Unknown time  . CREON 36000 units CPEP capsule Take 36,000 Units by mouth  3 (three) times daily with meals.    03/10/2017 at Unknown time  . dicyclomine (BENTYL) 10 MG capsule Take 10 mg by mouth 3 (three) times daily before meals.    03/10/2017 at Unknown time  . FLUoxetine (PROZAC) 40 MG capsule Take 40 mg by mouth 3 (three) times daily.    03/10/2017 at Unknown time  . furosemide (LASIX) 20 MG tablet Take 20 mg by mouth 2 (two) times daily.    03/10/2017 at Unknown time  . gabapentin (NEURONTIN) 800 MG tablet Take 800 mg by mouth 4 (four) times daily.   03/10/2017 at Unknown time  . hydrOXYzine (VISTARIL) 25 MG capsule Take 25 mg by mouth 4 (four) times daily.   03/10/2017 at Unknown time  . MYRBETRIQ 50 MG TB24 tablet Take 1 tablet by mouth at bedtime.     . naproxen (NAPROSYN) 500 MG tablet Take 1 tablet (500 mg total) by mouth 2 (two) times daily as needed for mild pain or moderate pain.   Past Month at Unknown time  . oxybutynin (DITROPAN XL) 15 MG 24 hr tablet Take 15 mg by mouth at bedtime.    03/09/2017 at Unknown time  . oxyCODONE-acetaminophen (PERCOCET) 10-325 MG tablet Take 1 tablet by mouth 3 (three) times daily. Scheduled 7a, 12p, 5p   03/10/2017 at Unknown time  . pantoprazole (PROTONIX) 40 MG tablet Take 40 mg by mouth daily.    03/10/2017 at Unknown time  . simvastatin (ZOCOR) 20 MG tablet Take 2 tablets (40 mg total) by mouth at bedtime. (Patient not taking: Reported on 11/29/2016) 60 tablet 0 Not Taking at Unknown time  . simvastatin (ZOCOR) 40 MG tablet Take 40 mg by mouth daily.   03/10/2017 at Unknown time  . VENTOLIN HFA 108 (90 Base) MCG/ACT inhaler Inhale 1-2 puffs into the lungs every 6 (six) hours as needed for wheezing.    Past Month at Unknown time    Patient Stressors: Loss of fiance Traumatic event  Patient Strengths: Average or above average intelligence Capable of independent living Communication skills Supportive family/friends  Treatment Modalities: Medication Management, Group therapy, Case management,  1 to 1 session with clinician,  Psychoeducation, Recreational therapy.   Physician Treatment Plan for Primary Diagnosis: Bipolar 1 disorder, depressed, severe (HCC) Long Term Goal(s): Improvement in symptoms so as ready for discharge Improvement in symptoms so as ready for discharge   Short Term Goals: Ability to identify changes in lifestyle  to reduce recurrence of condition will improve Ability to verbalize feelings will improve Ability to disclose and discuss suicidal ideas Ability to demonstrate self-control will improve Ability to identify and develop effective coping behaviors will improve Ability to maintain clinical measurements within normal limits will improve Compliance with prescribed medications will improve Ability to identify changes in lifestyle to reduce recurrence of condition will improve Ability to verbalize feelings will improve Ability to disclose and discuss suicidal ideas Ability to demonstrate self-control will improve Ability to identify and develop effective coping behaviors will improve Ability to maintain clinical measurements within normal limits will improve Compliance with prescribed medications will improve  Medication Management: Evaluate patient's response, side effects, and tolerance of medication regimen.  Therapeutic Interventions: 1 to 1 sessions, Unit Group sessions and Medication administration.  Evaluation of Outcomes: Progressing  Physician Treatment Plan for Secondary Diagnosis: Principal Problem:   Bipolar 1 disorder, depressed, severe (HCC)  Long Term Goal(s): Improvement in symptoms so as ready for discharge Improvement in symptoms so as ready for discharge   Short Term Goals: Ability to identify changes in lifestyle to reduce recurrence of condition will improve Ability to verbalize feelings will improve Ability to disclose and discuss suicidal ideas Ability to demonstrate self-control will improve Ability to identify and develop effective coping behaviors will  improve Ability to maintain clinical measurements within normal limits will improve Compliance with prescribed medications will improve Ability to identify changes in lifestyle to reduce recurrence of condition will improve Ability to verbalize feelings will improve Ability to disclose and discuss suicidal ideas Ability to demonstrate self-control will improve Ability to identify and develop effective coping behaviors will improve Ability to maintain clinical measurements within normal limits will improve Compliance with prescribed medications will improve     Medication Management: Evaluate patient's response, side effects, and tolerance of medication regimen.  Therapeutic Interventions: 1 to 1 sessions, Unit Group sessions and Medication administration.  Evaluation of Outcomes: Progressing   RN Treatment Plan for Primary Diagnosis: Bipolar 1 disorder, depressed, severe (HCC) Long Term Goal(s): Knowledge of disease and therapeutic regimen to maintain health will improve  Short Term Goals: Ability to remain free from injury will improve, Ability to demonstrate self-control and Ability to identify and develop effective coping behaviors will improve  Medication Management: RN will administer medications as ordered by provider, will assess and evaluate patient's response and provide education to patient for prescribed medication. RN will report any adverse and/or side effects to prescribing provider.  Therapeutic Interventions: 1 on 1 counseling sessions, Psychoeducation, Medication administration, Evaluate responses to treatment, Monitor vital signs and CBGs as ordered, Perform/monitor CIWA, COWS, AIMS and Fall Risk screenings as ordered, Perform wound care treatments as ordered.  Evaluation of Outcomes: Progressing   LCSW Treatment Plan for Primary Diagnosis: Bipolar 1 disorder, depressed, severe (HCC) Long Term Goal(s): Safe transition to appropriate next level of care at discharge,  Engage patient in therapeutic group addressing interpersonal concerns.  Short Term Goals: Engage patient in aftercare planning with referrals and resources, Increase emotional regulation and Increase skills for wellness and recovery  Therapeutic Interventions: Assess for all discharge needs, 1 to 1 time with Social worker, Explore available resources and support systems, Assess for adequacy in community support network, Educate family and significant other(s) on suicide prevention, Complete Psychosocial Assessment, Interpersonal group therapy.  Evaluation of Outcomes: Progressing   Progress in Treatment: Attending groups: Yes. Participating in groups: Yes. Taking medication as prescribed: Yes. Toleration medication: Yes. Family/Significant other contact made: No, will  contact:  pt's best friend, Victorino DikeJennifer for collateral information and to complete SPE. Patient understands diagnosis: Yes. Discussing patient identified problems/goals with staff: Yes. Medical problems stabilized or resolved: Yes. Denies suicidal/homicidal ideation: Yes. Issues/concerns per patient self-inventory: No. Other: n./a   New problem(s) identified: Pt remains on 1:1 due to safety/high fall risk and using walker.   New Short Term/Long Term Goal(s): medication management for mood stabilization, elimination of SI thoughts, development of comprehensive mental wellness plan.   Discharge Plan or Barriers: Pt plans to resume services with Dr. Evelene CroonKaur and is declining PHP/MHIOP or therapy referral. Pt agreeable to taking Hospice Grief counseling information and [facility]AG pamphlet for free community resources and support.   Reason for Continuation of Hospitalization: Anxiety Depression Medication stabilization  Estimated Length of Stay:72 hours request for discharge 03/11/17 at 4:20pm. Saturday 03/14/17 by 4:20PM  Attendees: Patient: 03/13/2017 8:44 AM  Physician: Dr. Jackquline BerlinIzediuno MD; Dr. Altamese Carolinaainville MD 03/13/2017 8:44 AM  Nursing:  Rayfield Citizenaroline RN 03/13/2017 8:44 AM  RN Care Manager: Onnie BoerJennifer Clark CM 03/13/2017 8:44 AM  Social Worker: Trula SladeHeather Smart, LCSW 03/13/2017 8:44 AM  Recreational Therapist: x 03/13/2017 8:44 AM  Other: Armandina StammerAgnes Nwoko NP; Feliz Beamravis Money NP 03/13/2017 8:44 AM  Other:  03/13/2017 8:44 AM  Other: 03/13/2017 8:44 AM    Scribe for Treatment Team: Ledell PeoplesHeather N Smart, LCSW 03/13/2017 8:44 AM

## 2017-03-13 NOTE — Progress Notes (Signed)
1:1 Note 0855  Patient sitting in dayroom with feet on footstool.  1:1 present for safety.  Patient stated she falls at home often.  Patient denied SI and HI, contracts for safety.  Denied A/V hallucinations.  Stated she is not in pain at this time.  Patient was given chewing gum this morning.  Respirations even and unlabored.  No signs/symptoms of pain/distress noted on patient's face/body movements.  Patient has walker beside her in the dayroom.  1:1 continues per MD order for safety.

## 2017-03-13 NOTE — ED Triage Notes (Signed)
See previous triage note for triage information. [facility]

## 2017-03-13 NOTE — ED Notes (Signed)
Bed: WA21 Expected date:  Expected time:  Means of arrival:  Comments: Traynor

## 2017-03-13 NOTE — Progress Notes (Signed)
Recreation Therapy Notes  Date: 03/13/17 Time: 0930 Location: 300 Hall Dayroom  Group Topic: Stress Management  Goal Area(s) Addresses:  Patient will verbalize importance of using healthy stress management.  Patient will identify positive emotions associated with healthy stress management.   Behavioral Response: Engaged  Intervention: Stress Management  Activity :  LRT introduced the stress management technique of meditation.  LRT read a script to allow patients to focus on being steadfast in different situations.  Education:  Stress Management, Discharge Planning.   Education Outcome: Acknowledges edcuation/In group clarification offered/Needs additional education  Clinical Observations/Feedback: Pt attended group.    Caroll RancherMarjette Francis Yardley, LRT/CTRS          Lillia AbedLindsay, Arika Mainer A 03/13/2017 10:35 AM

## 2017-03-13 NOTE — Progress Notes (Signed)
1:1 Note 1210  Patient came to medication window for noon medications.  Patient denied SI and HI, contracts for safety.  Denied A/V hallucinations.  Pain medication given for back pain #10.  Patient will discuss medications with MD today.  Respirations even and unlabored.  No signs/symptoms of pain/distress noted on patient's face/body movements.  Safety maintained with 15 minute checks.

## 2017-03-13 NOTE — Progress Notes (Signed)
1:1 Note 1530  Patient has been talking to peer support specialist.  Respirations even and unlabored.  No signs/symptoms of pain/distress noted on patient's face/body movements. 1:1 present for patient's safety.

## 2017-03-13 NOTE — Progress Notes (Signed)
1:1 note: Pt remains on 1:1 observation with staff within arm length of pt.  She is ambulating with walker and she took her scheduled morning medications.  Pt reports she feels more anxious since her Xanax was discontinued and she hopes it will be ordered again today.  She denies needs and concerns at this time.  Pt is safe on the unit.  Will continue to monitor and assess.

## 2017-03-13 NOTE — Progress Notes (Signed)
1:1 Note 1730  [facility]. Approximately 1630.  Patient stated she passed out and was lowered to the floor by 2 staff members.  BS 177/ 90, P85, O2 99%RA, CBG 129, R20.  [facility] was called, EMS called  [facility].  Patient was feeling better and patient was assisted to chair by 2 staff members.   [facility] was called and will pick up [facility].   Patient was given ginger ale.  Patient had told counselor Corrie DandyMary that several times during the day she had felt dizzy when she held her head back.  Patient stated her medications had been changed, that she had taken xanax at home "a long time".  Patient was transferred to wheelchair.  Patient also stated she had been stuttering several times today.  Presently patient in wheelchair with 1:1 present for safety, waiting for [facility].  Respirations even and unlabored.  No signs/symptoms of pain/distress noted on patient's face/body movements.  1:1 continues and [facility].  Patient left message for her friend who takes care of her cats that [facility] is [facility].

## 2017-03-13 NOTE — ED Triage Notes (Signed)
[facility] Patient had a syncopal episode and was lowered to the floor by staff. Patient reports a history of stroke. Patient also rports that she has been stuttering all day.

## 2017-03-13 NOTE — Progress Notes (Signed)
[facility] with 1:1 present for safety.

## 2017-03-13 NOTE — Progress Notes (Signed)
[facility] MD Progress Note  03/13/2017 11:07 AM Tammy Hines  MRN:  [id number]   Subjective:  Patient reports that she is feeling more anxious today than before, "I feel like I did when I first started getting treatment." She reports having difficulty staying asleep past 3 AM and states that it was due to being taken off the Xanax and started on Valium. She denies any SI/HI/AVH and contracts for safety. "I wasn't really suicidal. My fiance died and my mother is overwhelming and I just said it."  Objective: Patient's chart and findings reviewed and discussed with treatment team. Patient is pleasant and cooperative. Patient has signed a 72 hour release form and wants to discharge tomorrow before the winter storm comes in. Patient shows borderline traits. Will continue current medciations for stability.  Principal Problem: Bipolar 1 disorder, depressed, severe (HCC) Diagnosis:   Patient Active Problem List   Diagnosis Date Noted  . MDD (major depressive disorder), recurrent severe, without psychosis (HCC) [F33.2] 03/11/2017  . Bipolar 1 disorder, depressed, severe (HCC) [F31.4] 03/11/2017  . Acute encephalopathy [G93.40] 11/28/2016  . Stroke (cerebrum) (HCC) [I63.9]   . Acute ischemic stroke (HCC) [I63.9] 06/24/2016  . Bipolar disorder (HCC) [F31.9] 06/24/2016  . Hypertension [I10] 06/24/2016  . Hyperlipidemia [E78.5] 06/24/2016  . Anxiety [F41.9] 06/24/2016  . Facial droop [R29.810]   . GERD without esophagitis [K21.9]   . SORE THROAT [J02.9] 12/04/2008  . VIRAL URI [J06.9] 03/04/2008  . BLURRED VISION [H53.8] 10/22/2007  . FATIGUE [R53.81, R53.83] 10/22/2007  . DISC DISEASE, LUMBAR [M51.37] 09/06/2007  . COUGH [R05] 04/20/2007  . Depression [F32.9] 11/19/2006  . Asthma [J45.909] 11/19/2006  . GERD [K21.9] 11/19/2006  . HEADACHE [R51] 11/19/2006  . HEPATITIS B, HX OF [Z86.19] 11/19/2006   Total Time spent with patient: 15 minutes  Past Psychiatric History: See H&P  Past Medical  History:  Past Medical History:  Diagnosis Date  . Anxiety   . Arthritis    "right knee, left hip, going down my neck into my right shoulder" (06/24/2016)  . Asthma   . Bipolar 1 disorder (HCC)   . Chronic lower back pain   . Chronic neck pain   . COPD (chronic obstructive pulmonary disease) (HCC)   . Depression    [name] 06/24/2016  . Family history of adverse reaction to anesthesia    "makes my mom throw up"   . Fibromyalgia   . GERD (gastroesophageal reflux disease)    [name] 06/24/2016  . Hepatitis B   . History of hiatal hernia   . History of stomach ulcers   . History of transient ischemic attack (TIA)    /RN 06/24/2016  . Hyperlipidemia   . Hypertension   . Ischemic stroke (HCC)    acute/notes 06/24/2016; "left sided weakness"/RN (06/24/2016  . Migraine    "a few/year now" (06/24/2016)  . Obesity   . PONV (postoperative nausea and vomiting)     Past Surgical History:  Procedure Laterality Date  . ANAL FISSURE REPAIR    . FRACTURE SURGERY    . LOOP RECORDER INSERTION N/A 06/26/2016   Procedure: Loop Recorder Insertion;  Surgeon: Hillis Range, MD;  Location: [facility] INVASIVE CV LAB;  Service: Cardiovascular;  Laterality: N/A;  . MULTIPLE TOOTH EXTRACTIONS  2018   "I had 6 teeth pulled"  . SHOULDER ARTHROSCOPY W/ ROTATOR CUFF REPAIR Right 2000  . SHOULDER SURGERY Right 1999  . TEE WITHOUT CARDIOVERSION N/A 06/26/2016   Procedure: TRANSESOPHAGEAL ECHOCARDIOGRAM (TEE);  Surgeon: Rachelle Hora  Croitoru, MD;  Location: [facility] ENDOSCOPY;  Service: Cardiovascular;  Laterality: N/A;   Family History:  Family History  Problem Relation Age of Onset  . Hypertension Mother   . Cancer Father        esophageal  . Heart attack Maternal Grandfather    Family Psychiatric  History: See H&P Social History:  Social History   Substance and Sexual Activity  Alcohol Use Yes  . Alcohol/week: 0.6 oz  . Types: 1 Glasses of wine per week   Comment: "I drank one smironoff ice before I came) 12/04      Social History   Substance and Sexual Activity  Drug Use No    Social History   Socioeconomic History  . Marital status: Single    Spouse name: None  . Number of children: None  . Years of education: None  . Highest education level: None  Social Needs  . Financial resource strain: None  . Food insecurity - worry: None  . Food insecurity - inability: None  . Transportation needs - medical: None  . Transportation needs - non-medical: None  Occupational History  . None  Tobacco Use  . Smoking status: Current Every Day Smoker    Packs/day: 0.25    Years: 25.00    Pack years: 6.25    Types: Cigarettes  . Smokeless tobacco: Never Used  Substance and Sexual Activity  . Alcohol use: Yes    Alcohol/week: 0.6 oz    Types: 1 Glasses of wine per week    Comment: "I drank one smironoff ice before I came) 12/04  . Drug use: No  . Sexual activity: No  Other Topics Concern  . None  Social History Narrative   Lives alone.     Additional Social History:                         Sleep: Fair  Appetite:  Good  Current Medications: Current Facility-Administered Medications  Medication Dose Route Frequency Provider Last Rate Last Dose  . acetaminophen (TYLENOL) tablet 650 mg  650 mg Oral Q6H PRN Laveda AbbeParks, Laurie Britton, NP      . albuterol (PROVENTIL HFA;VENTOLIN HFA) 108 (90 Base) MCG/ACT inhaler 1-2 puff  1-2 puff Inhalation Q6H PRN Laveda AbbeParks, Laurie Britton, NP      . alum & mag hydroxide-simeth (MAALOX/MYLANTA) 200-200-20 MG/5ML suspension 30 mL  30 mL Oral Q4H PRN Laveda AbbeParks, Laurie Britton, NP      . asenapine (SAPHRIS) sublingual tablet 20 mg  20 mg Sublingual QHS Laveda AbbeParks, Laurie Britton, NP   20 mg at 03/12/17 2111  . aspirin EC tablet 325 mg  325 mg Oral Daily Laveda AbbeParks, Laurie Britton, NP   325 mg at 03/13/17 [id number]  . atenolol (TENORMIN) tablet 100 mg  100 mg Oral QHS Laveda AbbeParks, Laurie Britton, NP   100 mg at 03/12/17 2110  . buPROPion (WELLBUTRIN XL) 24 hr tablet 450 mg  450 mg Oral  Daily Laveda AbbeParks, Laurie Britton, NP   450 mg at 03/13/17 [id number]  . diazepam (VALIUM) tablet 10 mg  10 mg Oral QHS Izediuno, Delight OvensVincent A, MD   10 mg at 03/12/17 2111  . dicyclomine (BENTYL) capsule 10 mg  10 mg Oral TID AC Laveda AbbeParks, Laurie Britton, NP   10 mg at 03/13/17 0600  . FLUoxetine (PROZAC) capsule 20 mg  20 mg Oral Daily Izediuno, Delight OvensVincent A, MD   20 mg at 03/13/17 0839  . furosemide (LASIX) tablet 20 mg  20 mg Oral BID Laveda AbbeParks, Laurie Britton, NP   20 mg at 03/13/17 [id number]  . gabapentin (NEURONTIN) capsule 800 mg  800 mg Oral QID Laveda AbbeParks, Laurie Britton, NP   800 mg at 03/13/17 [id number]  . lipase/protease/amylase (CREON) capsule 36,000 Units  36,000 Units Oral TID WC Laveda AbbeParks, Laurie Britton, NP   36,000 Units at 03/13/17 0559  . magnesium hydroxide (MILK OF MAGNESIA) suspension 30 mL  30 mL Oral Daily PRN Laveda AbbeParks, Laurie Britton, NP      . mirabegron ER [facility](MYRBETRIQ) tablet 25 mg  25 mg Oral Daily Laveda AbbeParks, Laurie Britton, NP   25 mg at 03/12/17 1710  . naproxen (NAPROSYN) tablet 500 mg  500 mg Oral BID PRN Laveda AbbeParks, Laurie Britton, NP      . nicotine (NICODERM CQ - dosed in mg/24 hours) patch 21 mg  21 mg Transdermal Daily Cobos, Rockey SituFernando A, MD   21 mg at 03/13/17 0839  . oxybutynin (DITROPAN-XL) 24 hr tablet 15 mg  15 mg Oral QHS Laveda AbbeParks, Laurie Britton, NP   15 mg at 03/12/17 2110  . oxyCODONE-acetaminophen (PERCOCET/ROXICET) 5-325 MG per tablet 1 tablet  1 tablet Oral 3 times per day Laveda AbbeParks, Laurie Britton, NP   1 tablet at 03/13/17 0559   And  . oxyCODONE (Oxy IR/ROXICODONE) immediate release tablet 5 mg  5 mg Oral 3 times per day Laveda AbbeParks, Laurie Britton, NP   5 mg at 03/13/17 0559  . pantoprazole (PROTONIX) EC tablet 40 mg  40 mg Oral Daily Laveda AbbeParks, Laurie Britton, NP   40 mg at 03/13/17 [id number]  . simvastatin (ZOCOR) tablet 40 mg  40 mg Oral Daily Laveda AbbeParks, Laurie Britton, NP   40 mg at 03/13/17 [id number]    Lab Results: No results found for this or any previous visit (from the past 48 hour(s)).  Blood Alcohol level:  Lab Results   Component Value Date   ETH <10 03/10/2017   ETH <5 11/28/2016    Metabolic Disorder Labs: Lab Results  Component Value Date   HGBA1C 6.2 (H) 06/25/2016   MPG 131 06/25/2016   No results found for: PROLACTIN Lab Results  Component Value Date   CHOL 177 06/25/2016   TRIG 207 (H) 06/25/2016   HDL 38 (L) 06/25/2016   CHOLHDL 4.7 06/25/2016   VLDL 41 (H) 06/25/2016   LDLCALC 98 06/25/2016    Physical Findings: AIMS: Facial and Oral Movements Muscles of Facial Expression: None, normal Lips and Perioral Area: None, normal Jaw: None, normal Tongue: None, normal,Extremity Movements Upper (arms, wrists, hands, fingers): None, normal Lower (legs, knees, ankles, toes): None, normal, Trunk Movements Neck, shoulders, hips: None, normal, Overall Severity Severity of abnormal movements (highest score from questions above): None, normal Incapacitation due to abnormal movements: None, normal Patient's awareness of abnormal movements (rate only patient's report): No Awareness, Dental Status Current problems with teeth and/or dentures?: No Does patient usually wear dentures?: No  CIWA:    COWS:     Musculoskeletal: Strength & Muscle Tone: within normal limits Gait & Station: unsteady, uses walker Patient leans: N/A  Psychiatric Specialty Exam: Physical Exam  Nursing note and vitals reviewed. Constitutional: She is oriented to person, place, and time. She appears well-developed and well-nourished.  Cardiovascular: Normal rate.  Respiratory: Effort normal.  Musculoskeletal: Normal range of motion.  Neurological: She is alert and oriented to person, place, and time.  Skin: Skin is warm.    Review of Systems  Constitutional: Negative.   HENT: Negative.   Eyes: Negative.  Respiratory: Negative.   Cardiovascular: Negative.   Gastrointestinal: Negative.   Genitourinary: Negative.   Musculoskeletal: Negative.   Skin: Negative.   Neurological: Negative.   Endo/Heme/Allergies:  Negative.   Psychiatric/Behavioral: The patient is nervous/anxious.     Blood pressure (!) 152/92, pulse 71, temperature 98.7 F (37.1 C), temperature source Oral, resp. rate 20, height 5\' 7"  (1.702 m), weight 112.9 kg (249 lb), SpO2 98 %.Body mass index is 39 kg/m.  General Appearance: Casual  Eye Contact:  Good  Speech:  Clear and Coherent and Normal Rate  Volume:  Normal  Mood:  Anxious  Affect:  Congruent  Thought Process:  Goal Directed and Descriptions of Associations: Intact  Orientation:  Full (Time, Place, and Person)  Thought Content:  WDL  Suicidal Thoughts:  No  Homicidal Thoughts:  No  Memory:  Immediate;   Good Recent;   Good Remote;   Good  Judgement:  Fair  Insight:  Good  Psychomotor Activity:  Normal  Concentration:  Concentration: Good and Attention Span: Good  Recall:  Good  Fund of Knowledge:  Good  Language:  Good  Akathisia:  No  Handed:  Right  AIMS (if indicated):     Assets:  Communication Skills Desire for Improvement Financial Resources/Insurance Housing Social Support  ADL's:  Intact  Cognition:  WNL  Sleep:  Number of Hours: 4   Problems Addressed: Bipolar I  Treatment Plan Summary: Daily contact with patient to assess and evaluate symptoms and progress in treatment, Medication management and Plan is to:  -Continue Saphris 20 mg Sublingual QHS for mood stability -Continue Wellbutrin XL 450 mg PO daily for mood stability -Continue Prozac 20 mg PO Daily for mood stability -Continue Valium 10 mg PO QHS for anxiety -Continue Gabapentin 800 mg QID for pain and agitation -Encourage group therapy participation -Possible discharge tomorrow   Maryfrances Bunnell, FNP 03/13/2017, 11:07 AM

## 2017-03-13 NOTE — Plan of Care (Signed)
Nurse discussed anxiety, depression, coping skills with patient. 

## 2017-03-13 NOTE — Progress Notes (Signed)
1:1 Note 1100   Patient sitting in dayroom talking to peers and watching tv.  1:1 present for safety.  Respirations even and unlabored.  No signs/symptoms of pain/distress noted on patient's face/body movements.  1:1 continues for safety per MD orders.  Patient has walker beside her in dayroom.

## 2017-03-13 NOTE — Progress Notes (Signed)
1:1 Note 1810  Patient's self inventory sheet, patient has poor sleep, no sleep medication given.  Good appetite, low energy level, poor concentration.  Rated depression 8, anxiety and hopeless 10.  Denied SI.  Physical problems, back, knees, physical pain, worst pain #8 in past 24 hours.  Pain medication is helpful.  Goal is work on anxiety.  Plans to talk to people.  Anxiety level has gone up.  No discharge plans.

## 2017-03-13 NOTE — Progress Notes (Signed)
D: Pt is resting in her bed with eyes closed.  Respirations are even and unlabored.  No distress noted.  A: 1:1 staff remains with pt for safety.   R: Pt is safe on the unit.  Will continue to monitor and assess.  

## 2017-03-14 MED ORDER — ONDANSETRON HCL 4 MG PO TABS
ORAL_TABLET | ORAL | Status: AC
  Administered 2017-03-14: 02:00:00
  Filled 2017-03-14: qty 2

## 2017-03-14 MED ORDER — ASENAPINE MALEATE 5 MG SL SUBL: 20.0000 mg | SUBLINGUAL_TABLET | Freq: Every day | SUBLINGUAL | 0 refills | Status: DC

## 2017-03-14 MED ORDER — ONDANSETRON HCL 4 MG PO TABS
8.0000 mg | ORAL_TABLET | Freq: Three times a day (TID) | ORAL | Status: DC | PRN
  Administered 2017-03-14 (×2): 8 mg via ORAL
  Filled 2017-03-14: qty 2

## 2017-03-14 MED ORDER — NICOTINE 21 MG/24HR TD PT24: 21.0000 mg | MEDICATED_PATCH | Freq: Every day | TRANSDERMAL | 0 refills | Status: DC

## 2017-03-14 MED ORDER — BUPROPION HCL ER (XL) 450 MG PO TB24: 450.0000 mg | ORAL_TABLET | Freq: Every day | ORAL | 0 refills | Status: DC

## 2017-03-14 MED ORDER — FLUOXETINE HCL 20 MG PO CAPS: 20.0000 mg | ORAL_CAPSULE | Freq: Every day | ORAL | 0 refills | Status: DC

## 2017-03-14 NOTE — ED Provider Notes (Signed)
[facility]-EMERGENCY DEPT Provider Note   CSN: [id number] Arrival date & time: 03/11/17  2015     History   Chief Complaint Chief Complaint  Patient presents with  . Near Syncope    HPI Tammy Hines is a 56 y.o. female.  56 yo F with a significant past medical history of bipolar disorder with a chief complaint of a syncopal event.  The patient has had multiple medication changes over the past couple days while she is an inpatient psychiatry.  The patient became overwhelmed with trying to find a word to say to someone and so she kept repeating something over and over again with increasing agitation until she collapsed to the ground.  After which she was able to talk normally.  She is frustrated by her change in benzodiazepine from Xanax to Valium and thinks that it does not work as well.  Her anxiety has significantly increased since then.  She has started stuttering and having trouble finding her words.  She denies unilateral numbness or weakness.  Denies headache.  Denies chest pain or shortness of breath.   The history is provided by the patient.  Near Syncope  This is a new problem. The current episode started 6 to 12 hours ago. The problem occurs constantly. The problem has been resolved. Pertinent negatives include no chest pain, no headaches and no shortness of breath. Nothing aggravates the symptoms. Nothing relieves the symptoms. She has tried nothing for the symptoms. The treatment provided no relief.    Past Medical History:  Diagnosis Date  . Anxiety   . Arthritis    "right knee, left hip, going down my neck into my right shoulder" (06/24/2016)  . Asthma   . Bipolar 1 disorder (HCC)   . Chronic lower back pain   . Chronic neck pain   . COPD (chronic obstructive pulmonary disease) (HCC)   . Depression    [name] 06/24/2016  . Family history of adverse reaction to anesthesia    "makes my mom throw up"   . Fibromyalgia   . GERD  (gastroesophageal reflux disease)    [name] 06/24/2016  . Hepatitis B   . History of hiatal hernia   . History of stomach ulcers   . History of transient ischemic attack (TIA)    /RN 06/24/2016  . Hyperlipidemia   . Hypertension   . Ischemic stroke (HCC)    acute/notes 06/24/2016; "left sided weakness"/RN (06/24/2016  . Migraine    "a few/year now" (06/24/2016)  . Obesity   . PONV (postoperative nausea and vomiting)     Patient Active Problem List   Diagnosis Date Noted  . MDD (major depressive disorder), recurrent severe, without psychosis (HCC) 03/11/2017  . Bipolar 1 disorder, depressed, severe (HCC) 03/11/2017  . Acute encephalopathy 11/28/2016  . Stroke (cerebrum) (HCC)   . Acute ischemic stroke (HCC) 06/24/2016  . Bipolar disorder (HCC) 06/24/2016  . Hypertension 06/24/2016  . Hyperlipidemia 06/24/2016  . Anxiety 06/24/2016  . Facial droop   . GERD without esophagitis   . SORE THROAT 12/04/2008  . VIRAL URI 03/04/2008  . BLURRED VISION 10/22/2007  . FATIGUE 10/22/2007  . DISC DISEASE, LUMBAR 09/06/2007  . COUGH 04/20/2007  . Depression 11/19/2006  . Asthma 11/19/2006  . GERD 11/19/2006  . HEADACHE 11/19/2006  . HEPATITIS B, HX OF 11/19/2006    Past Surgical History:  Procedure Laterality Date  . ANAL FISSURE REPAIR    . FRACTURE SURGERY    .  LOOP RECORDER INSERTION N/A 06/26/2016   Procedure: Loop Recorder Insertion;  Surgeon: Hillis Range, MD;  Location: [facility] INVASIVE CV LAB;  Service: Cardiovascular;  Laterality: N/A;  . MULTIPLE TOOTH EXTRACTIONS  2018   "I had 6 teeth pulled"  . SHOULDER ARTHROSCOPY W/ ROTATOR CUFF REPAIR Right 2000  . SHOULDER SURGERY Right 1999  . TEE WITHOUT CARDIOVERSION N/A 06/26/2016   Procedure: TRANSESOPHAGEAL ECHOCARDIOGRAM (TEE);  Surgeon: Thurmon Fair, MD;  Location: [facility] ENDOSCOPY;  Service: Cardiovascular;  Laterality: N/A;    OB History    No data available       Home Medications    Prior to Admission medications     Medication Sig Start Date End Date Taking? Authorizing Provider  alprazolam Prudy Feeler) 2 MG tablet Take 2 mg by mouth 3 (three) times daily.     [provider]  Asenapine Maleate (SAPHRIS) 10 MG SUBL Place 20 mg under the tongue at bedtime.    [provider]  aspirin EC 325 MG EC tablet Take 1 tablet (325 mg total) by mouth daily. 06/28/16   Hongalgi, Maximino Greenland, MD  atenolol (TENORMIN) 100 MG tablet Take 1 tablet (100 mg total) by mouth at bedtime. 06/30/16   Hongalgi, Maximino Greenland, MD  buPROPion (WELLBUTRIN XL) 150 MG 24 hr tablet Take 150 mg by mouth daily. Take with wellbutrin xl 300 to equal 450 mg 10/20/16   [provider]  buPROPion (WELLBUTRIN XL) 300 MG 24 hr tablet Take 300 mg by mouth daily. Take with wellbutrin xl 150 mg to equal 450 mg    [provider]  CREON 36000 units CPEP capsule Take 36,000 Units by mouth 3 (three) times daily with meals.  10/20/16   [provider]  dicyclomine (BENTYL) 10 MG capsule Take 10 mg by mouth 3 (three) times daily before meals.     [provider]  FLUoxetine (PROZAC) 40 MG capsule Take 40 mg by mouth 3 (three) times daily.     [provider]  furosemide (LASIX) 20 MG tablet Take 20 mg by mouth 2 (two) times daily.     [provider]  gabapentin (NEURONTIN) 800 MG tablet Take 800 mg by mouth 4 (four) times daily.    [provider]  hydrOXYzine (VISTARIL) 25 MG capsule Take 25 mg by mouth 4 (four) times daily. 11/18/16   [provider]  MYRBETRIQ 50 MG TB24 tablet Take 1 tablet by mouth at bedtime. 03/06/17   [provider]  naproxen (NAPROSYN) 500 MG tablet Take 1 tablet (500 mg total) by mouth 2 (two) times daily as needed for mild pain or moderate pain. 06/27/16   Hongalgi, Maximino Greenland, MD  oxybutynin (DITROPAN XL) 15 MG 24 hr tablet Take 15 mg by mouth at bedtime.  10/20/16   [provider]  oxyCODONE-acetaminophen (PERCOCET) 10-325 MG tablet Take 1  tablet by mouth 3 (three) times daily. Scheduled 7a, 12p, 5p 12/13/15   [provider]  pantoprazole (PROTONIX) 40 MG tablet Take 40 mg by mouth daily.  10/20/16   [provider]  simvastatin (ZOCOR) 20 MG tablet Take 2 tablets (40 mg total) by mouth at bedtime. Patient not taking: Reported on 11/29/2016 06/27/16   [name], MD  simvastatin (ZOCOR) 40 MG tablet Take 40 mg by mouth daily. 11/17/16   [provider]  VENTOLIN HFA 108 (90 Base) MCG/ACT inhaler Inhale 1-2 puffs into the lungs every 6 (six) hours as needed for wheezing.  11/17/16  [provider]    Family History Family History  Problem Relation Age of Onset  . Hypertension Mother   . Cancer Father        esophageal  . Heart attack Maternal Grandfather     Social History Social History   Tobacco Use  . Smoking status: Current Every Day Smoker    Packs/day: 0.25    Years: 25.00    Pack years: 6.25    Types: Cigarettes  . Smokeless tobacco: Never Used  Substance Use Topics  . Alcohol use: Yes    Alcohol/week: 0.6 oz    Types: 1 Glasses of wine per week    Comment: "I drank one smironoff ice before I came) 12/04  . Drug use: No     Allergies   Ampicillin; Cleocin [clindamycin hcl]; Clonazepam; Other; and Penicillins   Review of Systems Review of Systems  Constitutional: Negative for chills and fever.  HENT: Negative for congestion and rhinorrhea.   Eyes: Negative for redness and visual disturbance.  Respiratory: Negative for shortness of breath and wheezing.   Cardiovascular: Positive for near-syncope. Negative for chest pain and palpitations.  Gastrointestinal: Negative for nausea and vomiting.  Genitourinary: Negative for dysuria and urgency.  Musculoskeletal: Negative for arthralgias and myalgias.  Skin: Negative for pallor and wound.  Neurological: Positive for syncope and speech difficulty. Negative for dizziness and headaches.     Physical Exam Updated  Vital Signs BP (!) 177/90   Pulse 85   Temp 98.7 F (37.1 C) (Oral)   Resp 20   Ht 5\' 7"  (1.702 m)   Wt 112.9 kg (249 lb)   LMP  (LMP Unknown)   SpO2 99%   BMI 39.00 kg/m   Physical Exam  Constitutional: She is oriented to person, place, and time. She appears well-developed and well-nourished. No distress.  HENT:  Head: Normocephalic and atraumatic.  Eyes: EOM are normal. Pupils are equal, round, and reactive to light.  Neck: Normal range of motion. Neck supple.  Cardiovascular: Normal rate and regular rhythm. Exam reveals no gallop and no friction rub.  No murmur heard. Pulmonary/Chest: Effort normal. She has no wheezes. She has no rales.  Abdominal: Soft. She exhibits no distension and no mass. There is no tenderness. There is no guarding.  Musculoskeletal: She exhibits no edema or tenderness.  Neurological: She is alert and oriented to person, place, and time. She has normal strength. No cranial nerve deficit or sensory deficit. Coordination and gait normal. GCS eye subscore is 4. GCS verbal subscore is 5. GCS motor subscore is 6.  Skin: Skin is warm and dry. She is not diaphoretic.  Psychiatric: She has a normal mood and affect. Her behavior is normal.  Nursing note and vitals reviewed.    ED Treatments / Results  Labs (all labs ordered are listed, but only abnormal results are displayed) Labs Reviewed  GLUCOSE, CAPILLARY - Abnormal; Notable for the following components:      Result Value   Glucose-Capillary 129 (*)    All other components within normal limits    EKG  EKG Interpretation None       Radiology No results found.  Procedures Procedures (including critical care time)  Medications Ordered in ED Medications  acetaminophen (TYLENOL) tablet 650 mg (not administered)  alum & mag hydroxide-simeth (MAALOX/MYLANTA) 200-200-20 MG/5ML suspension 30 mL (not administered)  magnesium hydroxide (MILK OF MAGNESIA) suspension 30 mL (not administered)    albuterol (PROVENTIL HFA;VENTOLIN HFA) 108 (90 Base) MCG/ACT inhaler  1-2 puff (not administered)  asenapine (SAPHRIS) sublingual tablet 20 mg (20 mg Sublingual Given 03/12/17 2111)  aspirin EC tablet 325 mg (325 mg Oral Given 03/13/17 0838)  atenolol (TENORMIN) tablet 100 mg (100 mg Oral Given 03/12/17 2110)  buPROPion (WELLBUTRIN XL) 24 hr tablet 450 mg (450 mg Oral Given 03/13/17 0838)  dicyclomine (BENTYL) capsule 10 mg (10 mg Oral Given 03/13/17 1207)  furosemide (LASIX) tablet 20 mg (20 mg Oral Given 03/13/17 0838)  gabapentin (NEURONTIN) capsule 800 mg (0 mg Oral Hold 03/13/17 1805)  lipase/protease/amylase (CREON) capsule 36,000 Units (36,000 Units Oral Given 03/13/17 1208)  naproxen (NAPROSYN) tablet 500 mg (500 mg Oral Given 03/13/17 1216)  oxyCODONE-acetaminophen (PERCOCET/ROXICET) 5-325 MG per tablet 1 tablet (1 tablet Oral Given 03/13/17 1209)    And  oxyCODONE (Oxy IR/ROXICODONE) immediate release tablet 5 mg (5 mg Oral Given 03/13/17 1209)  pantoprazole (PROTONIX) EC tablet 40 mg (40 mg Oral Given 03/13/17 0838)  simvastatin (ZOCOR) tablet 40 mg (40 mg Oral Given 03/13/17 0838)  mirabegron ER (MYRBETRIQ) tablet 25 mg (25 mg Oral Given 03/13/17 1208)  nicotine (NICODERM CQ - dosed in mg/24 hours) patch 21 mg (21 mg Transdermal Patch Applied 03/13/17 0839)  oxybutynin (DITROPAN-XL) 24 hr tablet 15 mg (15 mg Oral Given 03/12/17 2110)  diazepam (VALIUM) tablet 10 mg (10 mg Oral Given 03/12/17 2111)  FLUoxetine (PROZAC) capsule 20 mg (20 mg Oral Given 03/13/17 0839)     Initial Impression / Assessment and Plan / ED Course  I have reviewed the triage vital signs and the nursing notes.  Pertinent labs & imaging results that were available during my care of the patient were reviewed by me and considered in my medical decision making (see chart for details).     56 yo F with a chief complaint of a syncopal event.  This sounds like it could be vasovagal or a panic attack.  The patient's EKG is  unremarkable.  Workup done through triage with no concerning finding.  Discharge home.  12:34 AM:  I have discussed the diagnosis/risks/treatment options with the patient and believe the pt to be eligible for discharge home to follow-up with PCP. We also discussed returning to the ED immediately if new or worsening sx occur. We discussed the sx which are most concerning (e.g., sudden worsening pain, fever, inability to tolerate by mouth) that necessitate immediate return. Medications administered to the patient during their visit and any new prescriptions provided to the patient are listed below.  Medications given during this visit Medications  acetaminophen (TYLENOL) tablet 650 mg (not administered)  alum & mag hydroxide-simeth (MAALOX/MYLANTA) 200-200-20 MG/5ML suspension 30 mL (not administered)  magnesium hydroxide (MILK OF MAGNESIA) suspension 30 mL (not administered)  albuterol (PROVENTIL HFA;VENTOLIN HFA) 108 (90 Base) MCG/ACT inhaler 1-2 puff (not administered)  asenapine (SAPHRIS) sublingual tablet 20 mg (20 mg Sublingual Given 03/12/17 2111)  aspirin EC tablet 325 mg (325 mg Oral Given 03/13/17 0838)  atenolol (TENORMIN) tablet 100 mg (100 mg Oral Given 03/12/17 2110)  buPROPion (WELLBUTRIN XL) 24 hr tablet 450 mg (450 mg Oral Given 03/13/17 0838)  dicyclomine (BENTYL) capsule 10 mg (10 mg Oral Given 03/13/17 1207)  furosemide (LASIX) tablet 20 mg (20 mg Oral Given 03/13/17 0838)  gabapentin (NEURONTIN) capsule 800 mg (0 mg Oral Hold 03/13/17 1805)  lipase/protease/amylase (CREON) capsule 36,000 Units (36,000 Units Oral Given 03/13/17 1208)  naproxen (NAPROSYN) tablet 500 mg (500 mg Oral Given 03/13/17 1216)  oxyCODONE-acetaminophen (PERCOCET/ROXICET) 5-325 MG per tablet  1 tablet (1 tablet Oral Given 03/13/17 1209)    And  oxyCODONE (Oxy IR/ROXICODONE) immediate release tablet 5 mg (5 mg Oral Given 03/13/17 1209)  pantoprazole (PROTONIX) EC tablet 40 mg (40 mg Oral Given 03/13/17 0838)    simvastatin (ZOCOR) tablet 40 mg (40 mg Oral Given 03/13/17 0838)  mirabegron ER (MYRBETRIQ) tablet 25 mg (25 mg Oral Given 03/13/17 1208)  nicotine (NICODERM CQ - dosed in mg/24 hours) patch 21 mg (21 mg Transdermal Patch Applied 03/13/17 0839)  oxybutynin (DITROPAN-XL) 24 hr tablet 15 mg (15 mg Oral Given 03/12/17 2110)  diazepam (VALIUM) tablet 10 mg (10 mg Oral Given 03/12/17 2111)  FLUoxetine (PROZAC) capsule 20 mg (20 mg Oral Given 03/13/17 8119)     The patient appears reasonably screen and/or stabilized for discharge and I doubt any other medical condition or other [facility] requiring further screening, evaluation, or treatment in the ED at this time prior to discharge.    Final Clinical Impressions(s) / ED Diagnoses   Final diagnoses:  Vasovagal syncope    ED Discharge Orders    None       Melene Plan, DO 03/14/17 0034

## 2017-03-14 NOTE — Progress Notes (Signed)
Pt states she feels very dizzy and nauseated when she woke up.  Pt states she can not get out of bed safely at this time.  NP notified, advised for Pt to remain in bed until doctor arrives today.  Pt is being monitored on 1:1.

## 2017-03-14 NOTE — Progress Notes (Signed)
Pt is prepared for dc as Clinical research associatewriter reviews her dc follow up. Pt completed her daily assessment and on this she wrote she denied having SI today and she rated her depression, hopelessness and anxiety " 7/5/9, respectively. Patient says  " I don't know how I'm going to manage...but I'm  alone now..my fiance died and now I'm all alone..I gotta get home to my cats. Cc of dc instructions given to pt ( AVS, SRA, SSP and transition record ) . ALl belongings are returned to her and then she was pushed in her wheel chair to the search room- where she retrieved all belongings that were previously in locker. She is assisted by this wirter to repack her belongings and then she is taken to the front of the building and dc  'd ion stable condition.

## 2017-03-14 NOTE — Progress Notes (Signed)
1:1 at 1330 D Pt remains on 1:1 due to high fall risk. Pt willing and able to contract for safety and admits that she is " ready to be discharged". A Patient fluctuates on wanting to go home and then not being able to find  Transportation home. Currently we are awaiting call back if friend will be able to provide her with transportation home.. R Safety is in place.

## 2017-03-14 NOTE — [facility] Group Notes (Signed)
[facility]  Date/Time:    03/14/2017 10:00-11:00AM  Type of Therapy and Topic:  Group Therapy:  Healthy vs Unhealthy Coping Skills  Participation Level:  Active   Description of Group:  The focus of this group was to determine what unhealthy coping techniques typically are used by group members and what healthy coping techniques would be helpful in coping with various problems. Patients were guided in becoming aware of the differences between healthy and unhealthy coping techniques.  Patients were asked to identify 1-2 healthy coping skills they would like to learn to use more effectively,.  Therapeutic Goals 1. Patients learned that coping is what human beings do all day long to deal with various situations in their lives 2. Patients defined and discussed healthy vs unhealthy coping techniques 3. Patients identified their preferred coping techniques and identified whether these were healthy or unhealthy 4. Patients determined 1-2 healthy coping skills they would like to become more familiar with and use more often, and practiced a few meditations 5. Patients provided support and ideas to each other  Summary of Patient Progress: During group, patient expressed that she has to take care of her cats or she would stay in the hospital as she likes it and she feels she is benefiting.  She talked about wanting to learn how to set better boundaries with her mother.   Therapeutic Modalities Cognitive Behavioral Therapy Motivational Interviewing   Ambrose MantleMareida Grossman-Orr, LCSW 03/14/2017, 12:32 PM

## 2017-03-14 NOTE — [facility] Suicide Risk Assessment (Signed)
[facility] INPATIENT:  Family/Significant Other Suicide Prevention Education  Suicide Prevention Education:  Education Completed; Stark JockFriend Jennifer Moffitt [phone] has been identified by the patient as the family member/significant other with whom the patient will be residing, and identified as the person(s) who will aid the patient in the event of a mental health crisis (suicidal ideations/suicide attempt).  With written consent from the patient, the family member/significant other has been provided the following suicide prevention education, prior to the and/or following the discharge of the patient.  The suicide prevention education provided includes the following:  Suicide risk factors  Suicide prevention and interventions  National Suicide Hotline telephone number  [facility] assessment telephone number  [facility] Emergency Assistance 911  [facility] and/or Residential Mobile Crisis Unit telephone number  Request made of family/significant other to:  Remove weapons (e.g., guns, rifles, knives), all items previously/currently identified as safety concern.    Remove drugs/medications (over-the-counter, prescriptions, illicit drugs), all items previously/currently identified as a safety concern.  The family member/significant other verbalizes understanding of the suicide prevention education information provided.  The family member/significant other agrees to remove the items of safety concern listed above.  Friend was concerned about pt trying to get rid of her cats in order to be able to proceed with suicide.  States she has not expressed this for a few days.  There are no guns in the home.  Carloyn JaegerMareida J Grossman-Orr 03/14/2017, 11:15 AM

## 2017-03-14 NOTE — Progress Notes (Signed)
1:1 NSG note:  D.  Pt went to bed after refusing saphris dose due to nausea as well as ditropan XL.  Pt presently resting with eyes closed, respirations even and unlabored.  No distress noted.  A.  1:1 continued as ordered for patient safety  R.  Pt remains safe at this time.

## 2017-03-14 NOTE — Progress Notes (Signed)
  [facility] Dis[facility] :  Will you be returning to the same living situation after discharge:  Yes,  living alone At discharge, do you have transportation home?: Yes,  arranging with a friend Do you have the ability to pay for your medications: Yes,  states there are no barriers  Release of information consent forms completed and turned in to Medical Records by CSW.  Patient to Follow up at: Follow-up Information    Milagros EvenerKaur, Rupinder, MD Follow up on 04/27/2017.   Specialty:  Psychiatry Why:  Medication management with Dr. Evelene CroonKaur on Monday 1/21 at 4:00PM. You have also been placed on the cancellation list.  Contact information: [address] [address] [place] [address] [phone]        Patient declined therapy referral/[facility]. Follow up.        Quitman LivingsHassan, Sami, MD Follow up.   Specialty:  Internal Medicine Contact information: [address] [place] [address] [phone]           Next level of care provider has access to [facility] Link:no  Safety Planning and Suicide Prevention discussed: Yes,  with friend  Have you used any form of tobacco in the last 30 days? (Cigarettes, Smokeless Tobacco, Cigars, and/or Pipes): Yes  Has patient been referred to the Quitline?: Yes, faxed on 03/14/2017   Patient has been referred for addiction treatment: Pt. refused referral  Lynnell ChadMareida J Grossman-Orr, LCSW 03/14/2017, 11:17 AM

## 2017-03-14 NOTE — Progress Notes (Signed)
Late entry      1:1 for 0930 D Pt resting in her bed quietly with 1:1 sitter at her side. Pt reports nausea " a little bit" better from zofran she took sometime early this am. Pt speaks in a childlike voice , mumbling into her chest. It is diff for writer to understand her words. A She maintains a flat, depressed affect. She completes her daily assessment, on this she wrote she deneid SI today and she rated her depression, hopelessness and anxeity " 7/5/9", respectively. R Safety in plae with 1:1 continuing.
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
## 2017-03-14 NOTE — Progress Notes (Signed)
1:1 note:    D.  [facility] by [facility]HT.  Pt reports continued nausea and severe dizziness.  Pt refused some of her PO medications due to this.  Pt requested nausea medication.  A.  NP notified of dizziness and nausea and new order received for Zofran 8 mg.  Order also received to give Pt any of her night medications that she can tolerate as she did not receive them in ED.  R.  Pt remains on 1:1 for safety and remains safe on the unit at this time.  Will continue to monitor.

## 2017-03-14 NOTE — Discharge Instructions (Signed)
Eat and drink well for the next couple of days.  Return for worsening symptoms.  °

## 2017-03-14 NOTE — BHH Suicide Risk Assessment (Signed)
[facility]   Principal Problem: Bipolar 1 disorder, depressed, severe (HCC) Discharge Diagnoses:  Patient Active Problem List   Diagnosis Date Noted  . MDD (major depressive disorder), recurrent severe, without psychosis (HCC) [F33.2] 03/11/2017  . Bipolar 1 disorder, depressed, severe (HCC) [F31.4] 03/11/2017  . Acute encephalopathy [G93.40] 11/28/2016  . Stroke (cerebrum) (HCC) [I63.9]   . Acute ischemic stroke (HCC) [I63.9] 06/24/2016  . Bipolar disorder (HCC) [F31.9] 06/24/2016  . Hypertension [I10] 06/24/2016  . Hyperlipidemia [E78.5] 06/24/2016  . Anxiety [F41.9] 06/24/2016  . Facial droop [R29.810]   . GERD without esophagitis [K21.9]   . SORE THROAT [J02.9] 12/04/2008  . VIRAL URI [J06.9] 03/04/2008  . BLURRED VISION [H53.8] 10/22/2007  . FATIGUE [R53.81, R53.83] 10/22/2007  . DISC DISEASE, LUMBAR [M51.37] 09/06/2007  . COUGH [R05] 04/20/2007  . Depression [F32.9] 11/19/2006  . Asthma [J45.909] 11/19/2006  . GERD [K21.9] 11/19/2006  . HEADACHE [R51] 11/19/2006  . HEPATITIS B, HX OF [Z86.19] 11/19/2006    Total Time spent with patient: 30 minutes  Musculoskeletal: Strength & Muscle Tone: within normal limits Gait & Station:  Mobilizes with a walker Patient leans: N/A  Psychiatric Specialty Exam: Review of Systems  Constitutional: Negative.   HENT: Negative.   Eyes: Negative.   Respiratory: Negative.   Cardiovascular: Negative.   Gastrointestinal: Negative.   Genitourinary: Negative.   Skin: Negative.   Neurological: Negative.   Endo/Heme/Allergies: Negative.   Psychiatric/Behavioral: Negative for depression, hallucinations, memory loss, substance abuse and suicidal ideas. The patient is not nervous/anxious and does not have insomnia.     Blood pressure (!) 126/58, pulse 67, temperature 98.4 F (36.9 C), temperature source Oral, resp. rate 16, height 5\' 7"  (1.702 m), weight 112.9 kg (249 lb), SpO2 99 %.Body mass index is 39 kg/m.   General Appearance:  In bed, not in any distress. Calm and cooperative. Good relatedness. Appropriate behavior. Not internally distracted.   Eye Contact::  Good  Speech:  Spontaneous, normal prosody. Normal tone and rate.   Volume:  Normal  Mood:  Euthymic  Affect:  Appropriate and Congruent  Thought Process:  Linear  Orientation:  Full (Time, Place, and Person)  Thought Content:  No delusional theme. No preoccupation with violent thoughts. No negative ruminations. No obsession.  No hallucination in any modality.   Suicidal Thoughts:  No  Homicidal Thoughts:  No  Memory:  Immediate;   Good Recent;   Good Remote;   Good  Judgement:  Good  Insight:  Good  Psychomotor Activity:  Normal  Concentration:  Good  Recall:  Good  Fund of Knowledge:Good  Language: Good  Akathisia:  Negative  Handed:    AIMS (if indicated):     Assets:  Communication Skills Desire for Improvement Housing Resilience  Sleep:  Number of Hours: 2.25  Cognition: WNL  ADL's:  Intact   Clinical Assessment::   56 y.o Caucasian female, single, lives with her cats, on SSID. Background history of Bipolar disorder and multiple medical issues. Presented to the ER via EMS. Her friend called because patient had expressed thoughts of suicide. She had asked for someone to take good care of her cats. Patient was reported to have taken an overdose of 10-15 oxycodone. She had already taken an extra dose of Xanax . Patient reports relationship with her mother as the main stressor.  Routine labs is significant for slightly elevated WBCC and mild hyponatremia., toxicology is negative,  UDS is positive for benzodiazepines. No alcohol.  Patient  was evaluated at the ER last night for syncope. She has been cleared for discharge.  Seen today. Says she is doing well mentally. Reinstated that she was not suicidal when she came in. Says she was just angry with her mom because she felt abandoned. Patient says she is looking forward to be  with her cats.  Reports that she is in good spirits. Not feeling depressed. Says she felt dizzy last night. She is feeling better now. She is able to think clearly. She is able to focus on task. Her thoughts are not crowded or racing. No evidence of mania. No hallucination in any modality. She is not making any delusional statement. No passivity of will/thought. She is fully in touch with reality. No thoughts of suicide. No thoughts of homicide. No violent thoughts. No overwhelming anxiety. No access to weapons.   Nursing staff reports that patient has been appropriate on the unit. Patient has been interacting well with peers. No behavioral issues. Patient has not voiced any suicidal thoughts. Patient has not been observed to be internally stimulated. Patient has been adherent with treatment recommendations. Patient has been tolerating their medication well.   Patient was discussed at team. Team members feels that patient is back to her baseline level of function. Team agrees with plan to discharge patient today.   Demographic Factors:  Living alone  Loss Factors: Loss of significant relationship  Historical Factors: Prior suicide attempts and Impulsivity  Risk Reduction Factors:   Positive social support, Positive therapeutic relationship and Positive coping skills or problem solving skills  Continued Clinical Symptoms:  As above.  Cognitive Features That Contribute To Risk:  None    Suicide Risk:  Minimal: No identifiable suicidal ideation.  Patient is not having any thoughts of suicide at this time. Modifiable risk factors targeted during this admission includes rapid mood swing. Demographical and historical risk factors cannot be modified. Patient is now engaging well. Patient is reliable and is future oriented. We have buffered patient's support structures. At this point, patient is at low risk of suicide. Patient is aware of the effects of psychoactive substances on decision making  process. Patient has been provided with emergency contacts. Patient acknowledges to use resources provided if unforseen circumstances changes their current risk stratification.    Follow-up Information    Milagros EvenerKaur, Rupinder, MD Follow up on 04/27/2017.   Specialty:  Psychiatry Why:  Medication management with Dr. Evelene CroonKaur on Monday 1/21 at 4:00PM. You have also been placed on the cancellation list.  Contact information: [address]Tyson BabinskiO[address] [address] [place] [address] [phone]        Patient declined therapy referral/[facility] [place] [facility]. Follow up.        Quitman LivingsHassan, Sami, MD.   Specialty:  Internal Medicine Contact information: [address] [place] [address] [phone]           Plan Of Care/Follow-up recommendations:  1. Continue current psychotropic medications 2. Mental health and addiction follow up as arranged.  3. Provided limited quantity of prescriptions   Georgiann CockerVincent A Cj Beecher, MD 03/14/2017, 9:13 AM

## 2017-03-19 NOTE — BH Assessment (Signed)
[facility] recognized by this Clinical research associatewriter, to collect belongings left upon discharge. Patient received cash $105.00, check $15.22, second check $39.99. Patient signed Patient Valuables Envelope, placed signed form  in medical records.

## 2017-03-23 ENCOUNTER — Encounter: Payer: Medicare Other | Admitting: *Deleted

## 2017-05-18 ENCOUNTER — Ambulatory Visit: Payer: Medicare Other | Admitting: Neurology

## 2017-08-02 ENCOUNTER — Encounter (HOSPITAL_COMMUNITY): Payer: Self-pay | Admitting: Emergency Medicine

## 2017-08-02 ENCOUNTER — Emergency Department (HOSPITAL_COMMUNITY): Payer: Medicare Other

## 2017-08-02 ENCOUNTER — Emergency Department (HOSPITAL_COMMUNITY)
Admission: EM | Admit: 2017-08-02 | Discharge: 2017-08-02 | Disposition: A | Discharge status: Home / Self Care | Payer: Medicare Other | Attending: Emergency Medicine | Admitting: Emergency Medicine

## 2017-08-02 DIAGNOSIS — J45909 Unspecified asthma, uncomplicated: Secondary | ICD-10-CM | POA: Insufficient documentation

## 2017-08-02 DIAGNOSIS — F17210 Nicotine dependence, cigarettes, uncomplicated: Secondary | ICD-10-CM | POA: Diagnosis not present

## 2017-08-02 DIAGNOSIS — W19XXXA Unspecified fall, initial encounter: Secondary | ICD-10-CM | POA: Insufficient documentation

## 2017-08-02 DIAGNOSIS — Y9301 Activity, walking, marching and hiking: Secondary | ICD-10-CM | POA: Diagnosis not present

## 2017-08-02 DIAGNOSIS — Y929 Unspecified place or not applicable: Secondary | ICD-10-CM | POA: Diagnosis not present

## 2017-08-02 DIAGNOSIS — M549 Dorsalgia, unspecified: Secondary | ICD-10-CM | POA: Insufficient documentation

## 2017-08-02 DIAGNOSIS — R0789 Other chest pain: Secondary | ICD-10-CM | POA: Diagnosis not present

## 2017-08-02 DIAGNOSIS — M25512 Pain in left shoulder: Secondary | ICD-10-CM | POA: Diagnosis not present

## 2017-08-02 DIAGNOSIS — Z79899 Other long term (current) drug therapy: Secondary | ICD-10-CM | POA: Diagnosis not present

## 2017-08-02 DIAGNOSIS — Z8673 Personal history of transient ischemic attack (TIA), and cerebral infarction without residual deficits: Secondary | ICD-10-CM | POA: Insufficient documentation

## 2017-08-02 DIAGNOSIS — J449 Chronic obstructive pulmonary disease, unspecified: Secondary | ICD-10-CM | POA: Diagnosis not present

## 2017-08-02 DIAGNOSIS — M25552 Pain in left hip: Secondary | ICD-10-CM | POA: Insufficient documentation

## 2017-08-02 DIAGNOSIS — Y998 Other external cause status: Secondary | ICD-10-CM | POA: Insufficient documentation

## 2017-08-02 DIAGNOSIS — I10 Essential (primary) hypertension: Secondary | ICD-10-CM | POA: Insufficient documentation

## 2017-08-02 NOTE — ED Provider Notes (Signed)
[facility] EMERGENCY DEPARTMENT Provider Note   CSN: [id number] Arrival date & time: 08/02/17  1405     History   Chief Complaint Chief Complaint  Patient presents with  . Back Pain    HPI Tammy Hines is a 57 y.o. female.  HPI Patient presents with pain in multiple areas. She had a fall yesterday, mechanical, and she recalls the entire event. She was walking on an uneven surface with her walker. She lost balance, fell onto her left side. She may have had loss of consciousness, but has had no subsequent confusion, asymmetric weakness, vision changes, nausea, vomiting. She has no neck pain, but does have pain in her left shoulder, left rib cage, left hip. She has been ambulatory since the fall, but the pain is poorly controlled in spite of using home narcotics.  Past Medical History:  Diagnosis Date  . Anxiety   . Arthritis    "right knee, left hip, going down my neck into my right shoulder" (06/24/2016)  . Asthma   . Bipolar 1 disorder (HCC)   . Chronic lower back pain   . Chronic neck pain   . COPD (chronic obstructive pulmonary disease) (HCC)   . Depression    [name] 06/24/2016  . Family history of adverse reaction to anesthesia    "makes my mom throw up"   . Fibromyalgia   . GERD (gastroesophageal reflux disease)    [name] 06/24/2016  . Hepatitis B   . History of hiatal hernia   . History of stomach ulcers   . History of transient ischemic attack (TIA)    /RN 06/24/2016  . Hyperlipidemia   . Hypertension   . Ischemic stroke (HCC)    acute/notes 06/24/2016; "left sided weakness"/RN (06/24/2016  . Migraine    "a few/year now" (06/24/2016)  . Obesity   . PONV (postoperative nausea and vomiting)     Patient Active Problem List   Diagnosis Date Noted  . MDD (major depressive disorder), recurrent severe, without psychosis (HCC) 03/11/2017  . Bipolar 1 disorder, depressed, severe (HCC) 03/11/2017  . Acute encephalopathy 11/28/2016  .  Stroke (cerebrum) (HCC)   . Acute ischemic stroke (HCC) 06/24/2016  . Bipolar disorder (HCC) 06/24/2016  . Hypertension 06/24/2016  . Hyperlipidemia 06/24/2016  . Anxiety 06/24/2016  . Facial droop   . GERD without esophagitis   . SORE THROAT 12/04/2008  . VIRAL URI 03/04/2008  . BLURRED VISION 10/22/2007  . FATIGUE 10/22/2007  . DISC DISEASE, LUMBAR 09/06/2007  . COUGH 04/20/2007  . Depression 11/19/2006  . Asthma 11/19/2006  . GERD 11/19/2006  . HEADACHE 11/19/2006  . HEPATITIS B, HX OF 11/19/2006    Past Surgical History:  Procedure Laterality Date  . ANAL FISSURE REPAIR    . FRACTURE SURGERY    . LOOP RECORDER INSERTION N/A 06/26/2016   Procedure: Loop Recorder Insertion;  Surgeon: Hillis Range, MD;  Location: [facility] INVASIVE CV LAB;  Service: Cardiovascular;  Laterality: N/A;  . MULTIPLE TOOTH EXTRACTIONS  2018   "I had 6 teeth pulled"  . SHOULDER ARTHROSCOPY W/ ROTATOR CUFF REPAIR Right 2000  . SHOULDER SURGERY Right 1999  . TEE WITHOUT CARDIOVERSION N/A 06/26/2016   Procedure: TRANSESOPHAGEAL ECHOCARDIOGRAM (TEE);  Surgeon: Thurmon Fair, MD;  Location: [facility] ENDOSCOPY;  Service: Cardiovascular;  Laterality: N/A;     OB History   None      Home Medications    Prior to Admission medications   Medication Sig Start Date End Date  Taking? Authorizing Provider  asenapine (SAPHRIS) 5 MG SUBL 24 hr tablet Place 4 tablets (20 mg total) under the tongue at bedtime. 03/14/17   Oneta Rack, NP  atenolol (TENORMIN) 100 MG tablet Take 1 tablet (100 mg total) by mouth at bedtime. 06/30/16   Hongalgi, Maximino Greenland, MD  buPROPion 450 MG TB24 Take 450 mg by mouth daily. 03/14/17   Oneta Rack, NP  CREON 36000 units CPEP capsule Take 36,000 Units by mouth 3 (three) times daily with meals.  10/20/16   [provider]  dicyclomine (BENTYL) 10 MG capsule Take 10 mg by mouth 3 (three) times daily before meals.     [provider]  FLUoxetine (PROZAC) 20 MG capsule Take 1  capsule (20 mg total) by mouth daily. 03/14/17   Oneta Rack, NP  furosemide (LASIX) 20 MG tablet Take 20 mg by mouth 2 (two) times daily.     [provider]  gabapentin (NEURONTIN) 800 MG tablet Take 800 mg by mouth 4 (four) times daily.    [provider]  MYRBETRIQ 50 MG TB24 tablet Take 1 tablet by mouth at bedtime. 03/06/17   [provider]  nicotine (NICODERM CQ - DOSED IN MG/24 HOURS) 21 mg/24hr patch Place 1 patch (21 mg total) onto the skin daily. 03/14/17   Oneta Rack, NP  oxybutynin (DITROPAN XL) 15 MG 24 hr tablet Take 15 mg by mouth at bedtime.  10/20/16   [provider]  pantoprazole (PROTONIX) 40 MG tablet Take 40 mg by mouth daily.  10/20/16   [provider]  simvastatin (ZOCOR) 40 MG tablet Take 40 mg by mouth daily. 11/17/16   [provider]  VENTOLIN HFA 108 (90 Base) MCG/ACT inhaler Inhale 1-2 puffs into the lungs every 6 (six) hours as needed for wheezing.  11/17/16   [provider]    Family History Family History  Problem Relation Age of Onset  . Hypertension Mother   . Cancer Father        esophageal  . Heart attack Maternal Grandfather     Social History Social History   Tobacco Use  . Smoking status: Current Every Day Smoker    Packs/day: 0.25    Years: 25.00    Pack years: 6.25    Types: Cigarettes  . Smokeless tobacco: Never Used  Substance Use Topics  . Alcohol use: Yes    Alcohol/week: 0.6 oz    Types: 1 Glasses of wine per week    Comment: "I drank one smironoff ice before I came) 12/04  . Drug use: No     Allergies   Ampicillin; Cleocin [clindamycin hcl]; Clonazepam; Other; and Penicillins   Review of Systems Review of Systems  Constitutional:       Per HPI, otherwise negative  HENT:       Per HPI, otherwise negative  Respiratory:       Per HPI, otherwise negative  Cardiovascular:       Per HPI, otherwise negative  Gastrointestinal: Negative for vomiting.    Endocrine:       Negative aside from HPI  Genitourinary:       Neg aside from HPI   Musculoskeletal:       Per HPI, otherwise negative  Skin: Negative.   Neurological: Positive for weakness. Negative for syncope.     Physical Exam Updated Vital Signs BP (!) 108/57 (BP Location: Right Arm)   Pulse 77   Temp 98.5 F (  36.9 C) (Oral)   Resp 20   LMP  (LMP Unknown)   SpO2 92%   Physical Exam  Constitutional: She is oriented to person, place, and time. She appears well-developed and well-nourished. No distress.  HENT:  Head: Normocephalic and atraumatic.  Eyes: Conjunctivae and EOM are normal.  Cardiovascular: Normal rate and regular rhythm.  Pulmonary/Chest: Effort normal and breath sounds normal. No stridor. No respiratory distress.  Abdominal: She exhibits no distension.  Large abdomen with appreciable hernia, but no tenderness to palpation, no protuberance, no substantial guarding.  Musculoskeletal: She exhibits no edema.       Arms:      Legs: Neurological: She is alert and oriented to person, place, and time. No cranial nerve deficit.  Skin: Skin is warm and dry.  Psychiatric: She has a normal mood and affect.  Nursing note and vitals reviewed.    ED Treatments / Results  Labs (all labs ordered are listed, but only abnormal results are displayed) Labs Reviewed - No data to display  EKG None  Radiology Dg Ribs Unilateral W/chest Left  Result Date: 08/02/2017 CLINICAL DATA:  Mechanical fall yesterday. EXAM: LEFT RIBS AND CHEST - 3+ VIEW COMPARISON:  11/28/2016 FINDINGS: Borderline heart size, stable. No evidence of rib fracture. There is no edema, consolidation, effusion, or pneumothorax. Implantable loop recorder on the left. IMPRESSION: Negative for rib fracture. No evidence of active cardiopulmonary disease. Electronically Signed   By: Marnee Spring M.D.   On: 08/02/2017 21:25   Dg Shoulder Left  Result Date: 08/02/2017 CLINICAL DATA:  Mechanical fall  yesterday. Initial encounter. EXAM: LEFT SHOULDER - 2+ VIEW COMPARISON:  None. FINDINGS: Negative for fracture or dislocation. Minor spurring at the acromioclavicular joint. Implantable loop recorder on the left. IMPRESSION: Negative. Electronically Signed   By: Marnee Spring M.D.   On: 08/02/2017 21:23   Dg Hip Unilat With Pelvis 2-3 Views Left  Result Date: 08/02/2017 CLINICAL DATA:  Mechanical fall yesterday.  Pain. EXAM: DG HIP (WITH OR WITHOUT PELVIS) 2-3V LEFT COMPARISON:  Pelvic radiographs from 11/27/2016 FINDINGS: AP view the pelvis as well as AP and frog-leg views of the left hip. Bilateral degenerative joint space narrowing of the hips, moderate-to-marked on the left and moderate on the right. Lower lumbar degenerative facet arthropathy is seen. Intact bony pelvis without diastasis. No suspicious osseous lesions. Probable capsular calcifications encircling the femoral neck. These can be identified on prior comparison study from 2018 and are not felt to represent stigmata of an acute fracture. IMPRESSION: 1. Probable joint capsule calcifications encircling the left femoral neck, better visualized on the frog-leg view. These are visualized on prior study from 2018 and are not felt to represent an acute osseous abnormality. 2. 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 3. Intact bony pelvis. Electronically Signed   By: Tollie Eth M.D.   On: 08/02/2017 21:26    Procedures Procedures (including critical care time)  Medications Ordered in ED Medications - No data to display   Initial Impression / Assessment and Plan / ED Course  I have reviewed the triage vital signs and the nursing notes.  Pertinent labs & imaging results that were available during my care of the patient were reviewed by me and considered in my medical decision making (see chart for details).     10:58 PM On repeat exam the patient is awake alert, in no distress.  I discussed all findings including reassuring  x-ray results, which I reviewed. No evidence for  fracture, and given the absence of neurologic deficits, absence of hemodynamic instability, the patient is appropriate for discharge with close outpatient follow-up.  Final Clinical Impressions(s) / ED Diagnoses   Final diagnoses:  Fall, initial encounter     Gerhard Munch, MD 08/02/17 2259

## 2017-08-02 NOTE — Discharge Instructions (Signed)
As discussed, your evaluation today has been largely reassuring.  But, it is important that you monitor your condition carefully, and do not hesitate to return to the ED if you develop new, or concerning changes in your condition. ? ?Otherwise, please follow-up with your physician for appropriate ongoing care. ? ?

## 2017-08-02 NOTE — ED Triage Notes (Signed)
Pt to ER after mechanical fall yesterday. States was sitting on her walker and went to move forward and her walker laid down on its side, states hit her head. Primarily concerned about the pain she was experiencing in her back however took a oxycodone at home with full relief. Patient ambulatory with walker. A/o x4.

## 2017-10-27 ENCOUNTER — Ambulatory Visit: Payer: Medicare Other | Admitting: Podiatry

## 2018-02-22 ENCOUNTER — Encounter (HOSPITAL_COMMUNITY): Payer: Self-pay | Admitting: *Deleted

## 2018-02-22 ENCOUNTER — Emergency Department (HOSPITAL_COMMUNITY)
Admission: EM | Admit: 2018-02-22 | Discharge: 2018-02-22 | Disposition: A | Discharge status: Home / Self Care | Payer: Medicare Other | Attending: Emergency Medicine | Admitting: Emergency Medicine

## 2018-02-22 ENCOUNTER — Ambulatory Visit: Payer: Medicare Other | Admitting: Podiatry

## 2018-02-22 DIAGNOSIS — Z8673 Personal history of transient ischemic attack (TIA), and cerebral infarction without residual deficits: Secondary | ICD-10-CM | POA: Diagnosis not present

## 2018-02-22 DIAGNOSIS — F329 Major depressive disorder, single episode, unspecified: Secondary | ICD-10-CM

## 2018-02-22 DIAGNOSIS — F17210 Nicotine dependence, cigarettes, uncomplicated: Secondary | ICD-10-CM | POA: Insufficient documentation

## 2018-02-22 DIAGNOSIS — J449 Chronic obstructive pulmonary disease, unspecified: Secondary | ICD-10-CM | POA: Diagnosis not present

## 2018-02-22 DIAGNOSIS — Z008 Encounter for other general examination: Secondary | ICD-10-CM | POA: Insufficient documentation

## 2018-02-22 DIAGNOSIS — I10 Essential (primary) hypertension: Secondary | ICD-10-CM | POA: Diagnosis not present

## 2018-02-22 DIAGNOSIS — R45851 Suicidal ideations: Secondary | ICD-10-CM | POA: Diagnosis not present

## 2018-02-22 DIAGNOSIS — Z79899 Other long term (current) drug therapy: Secondary | ICD-10-CM | POA: Insufficient documentation

## 2018-02-22 DIAGNOSIS — F319 Bipolar disorder, unspecified: Secondary | ICD-10-CM | POA: Insufficient documentation

## 2018-02-22 DIAGNOSIS — F32A Depression, unspecified: Secondary | ICD-10-CM

## 2018-02-22 LAB — CBC
HCT: 38.5 % (ref 36.0–46.0)
HEMOGLOBIN: 11.7 g/dL — AB (ref 12.0–15.0)
MCH: 26.1 pg (ref 26.0–34.0)
MCHC: 30.4 g/dL (ref 30.0–36.0)
MCV: 85.9 fL (ref 80.0–100.0)
PLATELETS: 266 10*3/uL (ref 150–400)
RBC: 4.48 MIL/uL (ref 3.87–5.11)
RDW: 14.2 % (ref 11.5–15.5)
WBC: 8.7 10*3/uL (ref 4.0–10.5)
nRBC: 0 % (ref 0.0–0.2)

## 2018-02-22 LAB — COMPREHENSIVE METABOLIC PANEL
ALBUMIN: 3.7 g/dL (ref 3.5–5.0)
ALK PHOS: 88 U/L (ref 38–126)
ALT: 22 U/L (ref 0–44)
ANION GAP: 9 (ref 5–15)
AST: 21 U/L (ref 15–41)
BUN: 22 mg/dL — ABNORMAL HIGH (ref 6–20)
CALCIUM: 9.1 mg/dL (ref 8.9–10.3)
CO2: 25 mmol/L (ref 22–32)
Chloride: 104 mmol/L (ref 98–111)
Creatinine, Ser: 0.82 mg/dL (ref 0.44–1.00)
GFR calc Af Amer: >60 mL/min (ref >60)
GFR calc non Af Amer: >60 mL/min (ref >60)
GLUCOSE: 101 mg/dL — AB (ref 70–99)
Potassium: 4.3 mmol/L (ref 3.5–5.1)
SODIUM: 138 mmol/L (ref 135–145)
Total Bilirubin: 0.5 mg/dL (ref 0.3–1.2)
Total Protein: 6.9 g/dL (ref 6.5–8.1)

## 2018-02-22 LAB — RAPID URINE DRUG SCREEN, HOSP PERFORMED
Amphetamines: NOT DETECTED
Barbiturates: NOT DETECTED
Benzodiazepines: POSITIVE — AB
Cocaine: NOT DETECTED
OPIATES: POSITIVE — AB
Tetrahydrocannabinol: NOT DETECTED

## 2018-02-22 LAB — ACETAMINOPHEN LEVEL

## 2018-02-22 LAB — SALICYLATE LEVEL

## 2018-02-22 LAB — ETHANOL: Alcohol, Ethyl (B): <10 mg/dL (ref <10)

## 2018-02-22 MED ORDER — HYDROXYZINE PAMOATE 25 MG PO CAPS: 25.0000 mg | ORAL_CAPSULE | Freq: Four times a day (QID) | ORAL | Status: DC

## 2018-02-22 MED ORDER — ASENAPINE MALEATE 5 MG SL SUBL: 20.0000 mg | SUBLINGUAL_TABLET | Freq: Every day | SUBLINGUAL | Status: DC

## 2018-02-22 MED ORDER — MIRABEGRON ER 25 MG PO TB24
50.0000 mg | ORAL_TABLET | Freq: Every day | ORAL | Status: DC
  Filled 2018-02-22: qty 2

## 2018-02-22 MED ORDER — BUPROPION HCL ER (XL) 150 MG PO TB24: 150.0000 mg | ORAL_TABLET | Freq: Every day | ORAL | Status: DC

## 2018-02-22 MED ORDER — HYDROXYZINE HCL 25 MG PO TABS: 25.0000 mg | ORAL_TABLET | Freq: Four times a day (QID) | ORAL | Status: DC

## 2018-02-22 MED ORDER — ALPRAZOLAM 1 MG PO TABS: 2.0000 mg | ORAL_TABLET | Freq: Three times a day (TID) | ORAL | Status: DC | PRN

## 2018-02-22 MED ORDER — SIMVASTATIN 40 MG PO TABS
40.0000 mg | ORAL_TABLET | Freq: Every day | ORAL | Status: DC
  Filled 2018-02-22: qty 1

## 2018-02-22 MED ORDER — NAPROXEN 500 MG PO TABS: 500.0000 mg | ORAL_TABLET | Freq: Two times a day (BID) | ORAL | Status: DC

## 2018-02-22 MED ORDER — GABAPENTIN 400 MG PO CAPS: 800.0000 mg | ORAL_CAPSULE | Freq: Four times a day (QID) | ORAL | Status: DC

## 2018-02-22 MED ORDER — CLOMIPRAMINE HCL 25 MG PO CAPS
50.0000 mg | ORAL_CAPSULE | Freq: Every day | ORAL | Status: DC
  Filled 2018-02-22: qty 2

## 2018-02-22 MED ORDER — ATENOLOL 50 MG PO TABS
50.0000 mg | ORAL_TABLET | Freq: Two times a day (BID) | ORAL | Status: DC
  Filled 2018-02-22: qty 1

## 2018-02-22 MED ORDER — ALBUTEROL SULFATE HFA 108 (90 BASE) MCG/ACT IN AERS: 1.0000 | INHALATION_SPRAY | Freq: Four times a day (QID) | RESPIRATORY_TRACT | Status: DC | PRN

## 2018-02-22 MED ORDER — ASENAPINE MALEATE 5 MG SL SUBL: 10.0000 mg | SUBLINGUAL_TABLET | Freq: Every day | SUBLINGUAL | Status: DC

## 2018-02-22 NOTE — ED Notes (Signed)
Bed: WA29 Expected date:  Expected time:  Means of arrival:  Comments: EMS-[facility] 

## 2018-02-22 NOTE — Discharge Instructions (Signed)
Please follow-up with your psychiatrist as discussed during your evaluation.  You should return to the emergency department if you develop any worsening thoughts of hurting herself or have any other concerns.

## 2018-02-22 NOTE — ED Notes (Signed)
ED Provider at bedside. Chilton Greathouse. Lewis NP with psych
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
## 2018-02-22 NOTE — ED Notes (Signed)
Ed provider at bedside

## 2018-02-22 NOTE — ED Notes (Signed)
Requested a urine sample. 

## 2018-02-22 NOTE — ED Notes (Signed)
Awaiting PTAR for tx home

## 2018-02-22 NOTE — ED Notes (Signed)
Patient given water

## 2018-02-22 NOTE — ED Notes (Signed)
Blood sugar 143

## 2018-02-22 NOTE — ED Notes (Signed)
PTAR called for pt. They stated there is a long wait.
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
## 2018-02-22 NOTE — ED Notes (Signed)
Pt awaiting PTAR to tx home

## 2018-02-22 NOTE — ED Notes (Signed)
TTS at bedside to evaluate

## 2018-02-25 ENCOUNTER — Ambulatory Visit: Payer: Medicare Other | Admitting: Podiatry

## 2018-02-26 ENCOUNTER — Ambulatory Visit (INDEPENDENT_AMBULATORY_CARE_PROVIDER_SITE_OTHER): Payer: Medicare Other

## 2018-02-26 ENCOUNTER — Encounter: Payer: Self-pay | Admitting: Podiatry

## 2018-02-26 ENCOUNTER — Ambulatory Visit: Payer: Medicare Other | Admitting: Podiatry

## 2018-02-26 VITALS — BP 130/81 | HR 78 | Resp 18

## 2018-02-26 DIAGNOSIS — M79675 Pain in left toe(s): Secondary | ICD-10-CM

## 2018-02-26 DIAGNOSIS — M2042 Other hammer toe(s) (acquired), left foot: Secondary | ICD-10-CM | POA: Diagnosis not present

## 2018-02-26 DIAGNOSIS — M79674 Pain in right toe(s): Secondary | ICD-10-CM | POA: Diagnosis not present

## 2018-02-26 DIAGNOSIS — B351 Tinea unguium: Secondary | ICD-10-CM | POA: Diagnosis not present

## 2018-02-26 DIAGNOSIS — M2041 Other hammer toe(s) (acquired), right foot: Secondary | ICD-10-CM

## 2018-02-26 MED ORDER — CICLOPIROX 8 % EX SOLN: Freq: Every day | CUTANEOUS | 2 refills | Status: AC

## 2018-02-28 DIAGNOSIS — M2042 Other hammer toe(s) (acquired), left foot: Principal | ICD-10-CM

## 2018-02-28 DIAGNOSIS — M2041 Other hammer toe(s) (acquired), right foot: Secondary | ICD-10-CM | POA: Insufficient documentation

## 2018-02-28 NOTE — Progress Notes (Signed)
Subjective:   Patient ID: Tammy Hines, female   DOB: 57 y.o.   MRN: [id number]   HPI 57 year old female presents the office today for concerns of thick, discolored toenails that she cannot trim herself.  Nails are causing pain with pressure in the inside shoes.  She also states that she is concerned about hammertoes.  She has hammertoes over the arc of the any pain but her mom had to have surgery and she wants to have them checked.  She said no recent treatment for the hammertoes of the toenails.   Review of Systems  All other systems reviewed and are negative.  Past Medical History:  Diagnosis Date  . Anxiety   . Arthritis    "right knee, left hip, going down my neck into my right shoulder" (06/24/2016)  . Asthma   . Bipolar 1 disorder (HCC)   . Chronic lower back pain   . Chronic neck pain   . COPD (chronic obstructive pulmonary disease) (HCC)   . Depression    [name] 06/24/2016  . Family history of adverse reaction to anesthesia    "makes my mom throw up"   . Fibromyalgia   . GERD (gastroesophageal reflux disease)    [name] 06/24/2016  . Hepatitis B   . History of hiatal hernia   . History of stomach ulcers   . History of transient ischemic attack (TIA)    /RN 06/24/2016  . Hyperlipidemia   . Hypertension   . Ischemic stroke (HCC)    acute/notes 06/24/2016; "left sided weakness"/RN (06/24/2016  . Migraine    "a few/year now" (06/24/2016)  . Obesity   . PONV (postoperative nausea and vomiting)     Past Surgical History:  Procedure Laterality Date  . ANAL FISSURE REPAIR    . FRACTURE SURGERY    . LOOP RECORDER INSERTION N/A 06/26/2016   Procedure: Loop Recorder Insertion;  Surgeon: Hillis Range, MD;  Location: [facility]IVE [facility];  Service: Cardiovascular;  Laterality: N/A;  . MULTIPLE TOOTH EXTRACTIONS  2018   "I had 6 teeth pulled"  . SHOULDER ARTHROSCOPY W/ ROTATOR CUFF REPAIR Right 2000  . SHOULDER SURGERY Right 1999  . TEE WITHOUT CARDIOVERSION N/A 06/26/2016   Procedure: TRANSESOPHAGEAL ECHOCARDIOGRAM (TEE);  Surgeon: Thurmon Fair, MD;  Location: [facility] ENDOSCOPY;  Service: Cardiovascular;  Laterality: N/A;     Current Outpatient Medications:  .  alprazolam (XANAX) 2 MG tablet, Take 2 mg by mouth 3 (three) times daily as needed for anxiety., Disp: , Rfl:  .  asenapine (SAPHRIS) 5 MG SUBL 24 hr tablet, Place 4 tablets (20 mg total) under the tongue at bedtime. (Patient taking differently: Place 10 mg under the tongue at bedtime. ), Disp: 60 tablet, Rfl: 0 .  atenolol (TENORMIN) 50 MG tablet, Take 50 mg by mouth 2 (two) times daily. , Disp: , Rfl:  .  buPROPion (WELLBUTRIN XL) 150 MG 24 hr tablet, Take 150 mg by mouth daily., Disp: , Rfl:  .  buPROPion 450 MG TB24, Take 450 mg by mouth daily. (Patient not taking: Reported on 02/22/2018), Disp: 30 tablet, Rfl: 0 .  ciclopirox (PENLAC) 8 % solution, Apply topically at bedtime. Apply over nail and surrounding skin. Apply daily over previous coat. After seven (7) days, may remove with alcohol and continue cycle., Disp: 6.6 mL, Rfl: 2 .  clomiPRAMINE (ANAFRANIL) 50 MG capsule, Take 50 mg by mouth at bedtime., Disp: , Rfl:  .  CREON 36000 units CPEP capsule, Take 36,000 Units  by mouth 3 (three) times daily with meals. , Disp: , Rfl:  .  dicyclomine (BENTYL) 10 MG capsule, Take 10 mg by mouth 3 (three) times daily before meals. , Disp: , Rfl:  .  FLUoxetine (PROZAC) 40 MG capsule, Take 40 mg by mouth daily., Disp: , Rfl:  .  gabapentin (NEURONTIN) 800 MG tablet, Take 800 mg by mouth 4 (four) times daily., Disp: , Rfl:  .  hydrOXYzine (VISTARIL) 25 MG capsule, Take 25 mg by mouth 4 (four) times daily., Disp: , Rfl:  .  MYRBETRIQ 50 MG TB24 tablet, Take 1 tablet by mouth at bedtime., Disp: , Rfl:  .  naproxen (NAPROSYN) 500 MG tablet, Take 500 mg by mouth 2 (two) times daily., Disp: , Rfl:  .  nicotine (NICODERM CQ - DOSED IN MG/24 HOURS) 21 mg/24hr patch, Place 1 patch (21 mg total) onto the skin daily. (Patient  not taking: Reported on 02/22/2018), Disp: 28 patch, Rfl: 0 .  oxybutynin (DITROPAN XL) 15 MG 24 hr tablet, Take 15 mg by mouth at bedtime. , Disp: , Rfl:  .  oxyCODONE-acetaminophen (PERCOCET) 10-325 MG tablet, Take 1 tablet by mouth 3 (three) times daily., Disp: , Rfl:  .  pantoprazole (PROTONIX) 40 MG tablet, Take 40 mg by mouth daily. , Disp: , Rfl:  .  SAPHRIS 10 MG SUBL, Take 20 mg by mouth at bedtime., Disp: , Rfl:  .  simvastatin (ZOCOR) 40 MG tablet, Take 40 mg by mouth daily., Disp: , Rfl:  .  VENTOLIN HFA 108 (90 Base) MCG/ACT inhaler, Inhale 1-2 puffs into the lungs every 6 (six) hours as needed for wheezing. , Disp: , Rfl:   Allergies  Allergen Reactions  . Ampicillin Hives and Nausea And Vomiting    Has patient had a PCN reaction causing immediate rash, facial/tongue/throat swelling, SOB or lightheadedness with hypotension: No Has patient had a PCN reaction causing severe rash involving mucus membranes or skin necrosis: No Has patient had a PCN reaction that required hospitalization No Has patient had a PCN reaction occurring within the last 10 years: No If all of the above answers are "NO", then may proceed with Cephalosporin use.   Marland Kitchen Cleocin [Clindamycin Hcl]     "deathly sick"  . Clonazepam Other (See Comments)    Pt does not remember what the reaction was  . Other Other (See Comments)    Allergy to mold, down and feathers per allergy test  . Penicillins Other (See Comments)    Has patient had a PCN reaction causing immediate rash, facial/tongue/throat swelling, SOB or lightheadedness with hypotension: No Has patient had a PCN reaction causing severe rash involving mucus membranes or skin necrosis: No Has patient had a PCN reaction that required hospitalization No Has patient had a PCN reaction occurring within the last 10 years: No If all of the above answers are "NO", then may proceed with Cephalosporin use.   Pt told not to take because of reaction to ampicillin -  no         Objective:  Physical Exam  General: AAO x3, NAD  Dermatological: Nails are hypertrophic, dystrophic, brittle, discolored, elongated 10. No surrounding redness or drainage. Tenderness nails 1-5 bilaterally. No open lesions or pre-ulcerative lesions are identified today.  Vascular: Dorsalis Pedis artery and Posterior Tibial artery pedal pulses are 2/4 bilateral with immedate capillary fill time. There is no pain with calf compression, swelling, warmth, erythema.   Neruologic: Grossly intact via light touch bilateral.  Protective  threshold with Semmes Wienstein monofilament intact to all pedal sites bilateral.   Musculoskeletal: Hammertoes are present most of the second digits which are rigid other toes are flexible.  No pain in the hammertoes and there is no areas of skin breakdown.  Muscular strength 5/5 in all groups tested bilateral.  Gait: Unassisted, Nonantalgic.       Assessment:   Symptomatic onychomycosis, hammertoes    Plan:  -Treatment options discussed including all alternatives, risks, and complications -Etiology of symptoms were discussed -Nails debrided 10 without complications or bleeding.  I ordered Penlac for her. -In regards to the hammertoes we discussed continued shoes to help take pressure off the area lasted less than an exercise to help strengthen the toe as well as toe crest. -Daily foot inspection -Follow-up in 3 months if she wishes for nail debridement or sooner if any problems arise. In the meantime, encouraged to call the office with any questions, concerns, change in symptoms.   Ovid CurdMatthew Wagoner, DPM

## 2018-03-12 IMAGING — RF DG SWALLOWING FUNCTION - NRPT [facility]
8 series · 24 of 24 positions shown · non-contrast
Comparison: none

[Series 1: cp_standard · 0.51mm/px · 3 of 87 frames shown (1 of 8)]
[frame 3/87]
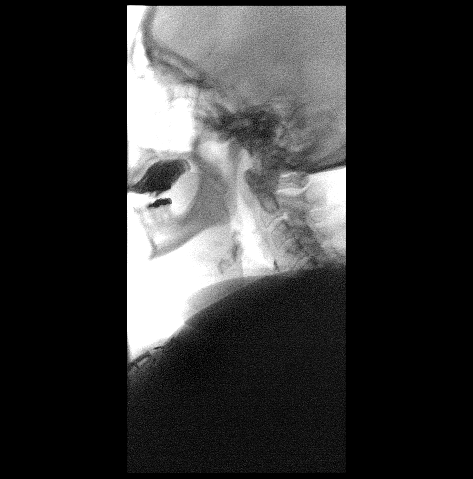
[frame 14/87]
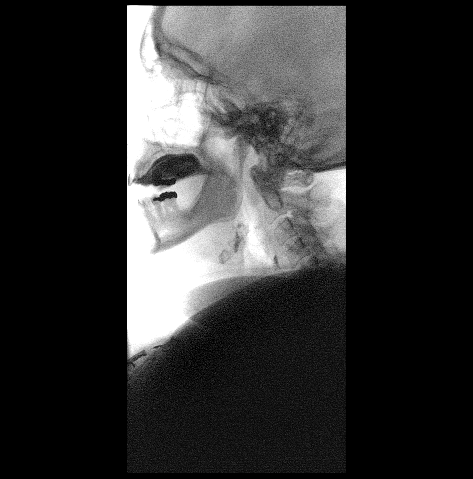
[frame 74/87]
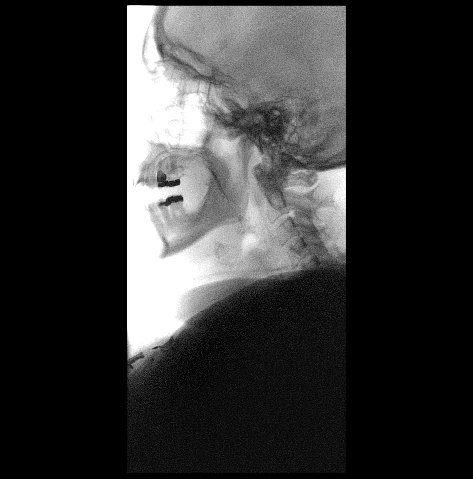

[Series 2: cp_standard · 0.51mm/px · 3 of 61 frames shown (2 of 8)]
[frame 10/61]
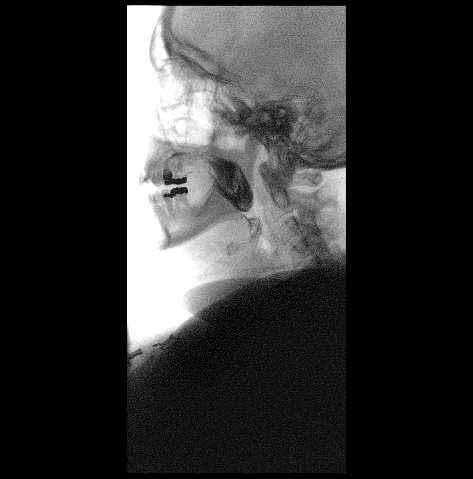
[frame 31/61]
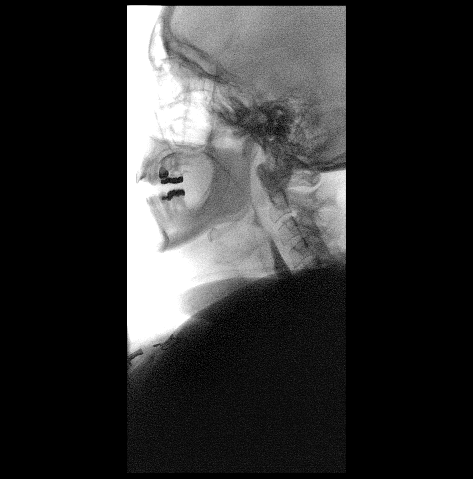
[frame 52/61]
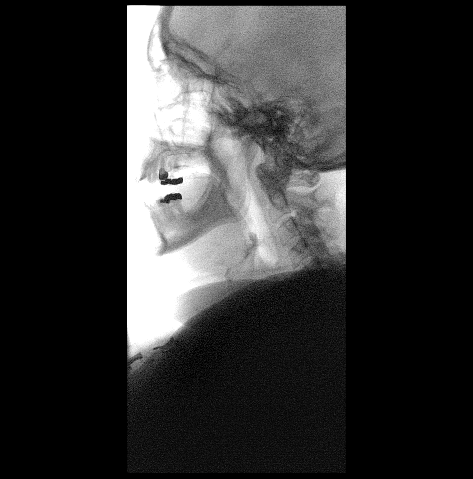

[Series 3: cp_standard · 0.51mm/px · 3 of 79 frames shown (3 of 8)]
[frame 12/79]
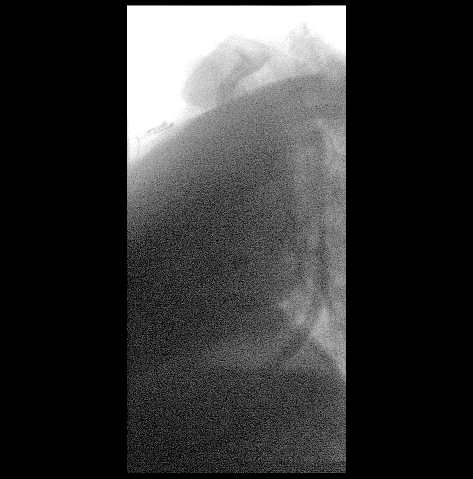
[frame 40/79]
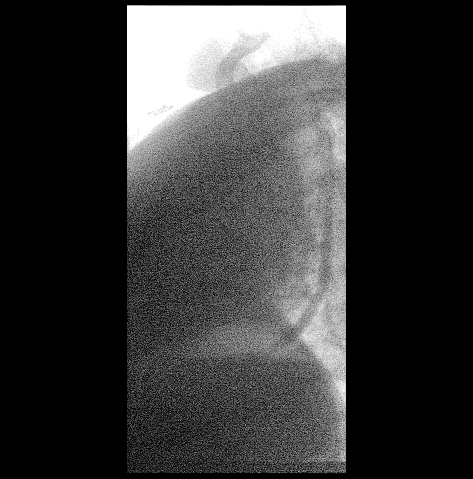
[frame 77/79]
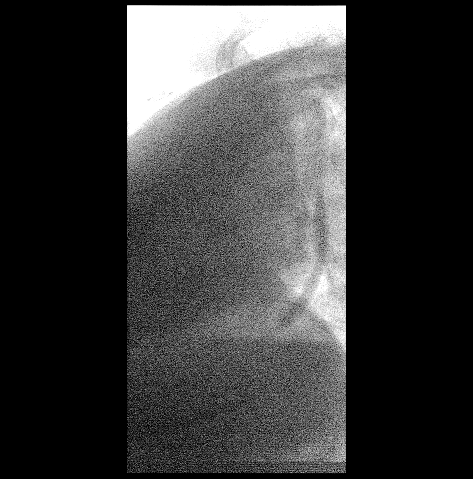

[Series 4: cp_standard · 0.51mm/px · 3 of 312 frames shown (4 of 8)]
[frame 47/312]
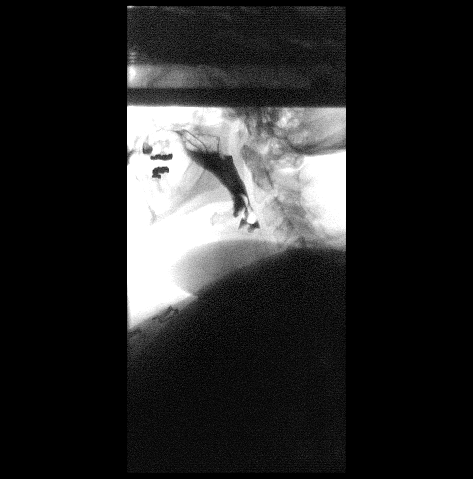
[frame 49/312]
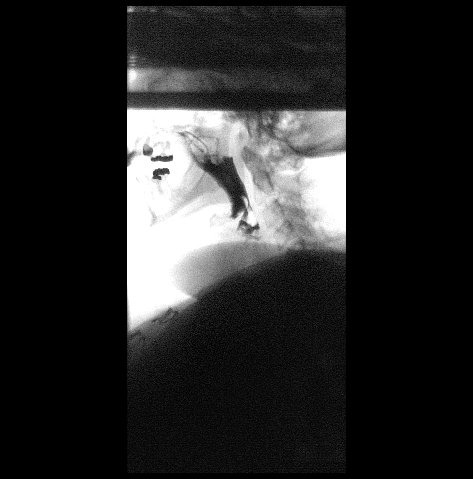
[frame 266/312]
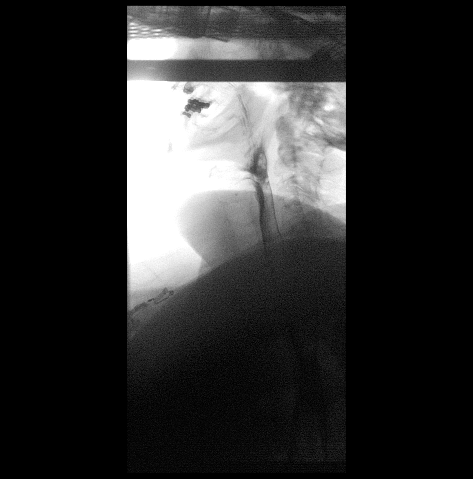

[Series 5: cp_standard · 0.51mm/px · 3 of 198 frames shown (5 of 8)]
[frame 30/198]
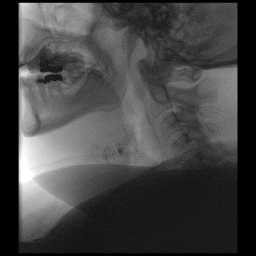
[frame 130/198]
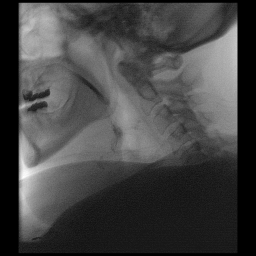
[frame 169/198]
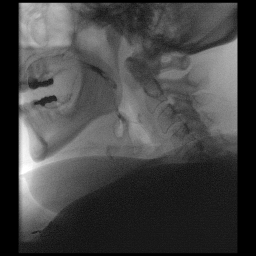

[Series 6: cp_standard · 0.51mm/px · 3 of 282 frames shown (6 of 8)]
[frame 43/282]
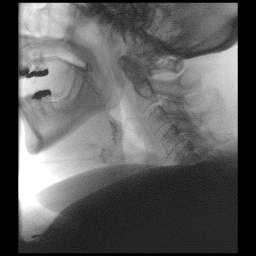
[frame 240/282]
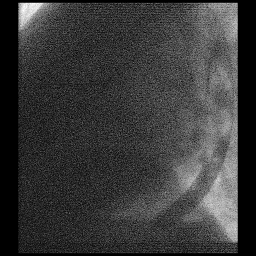
[frame 249/282]
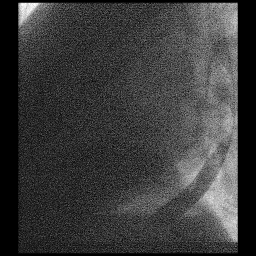

[Series 7: cp_standard · 0.51mm/px · 3 of 432 frames shown (7 of 8)]
[frame 65/432]
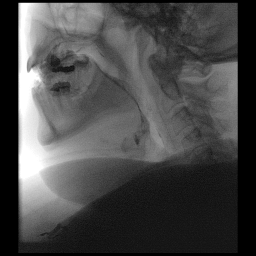
[frame 217/432]
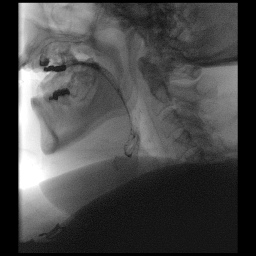
[frame 368/432]
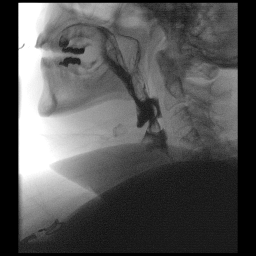

[Series 8: cp_standard · 0.51mm/px · 3 of 307 frames shown (8 of 8)]
[frame 28/307]
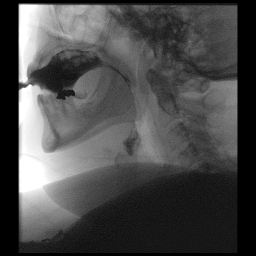
[frame 154/307]
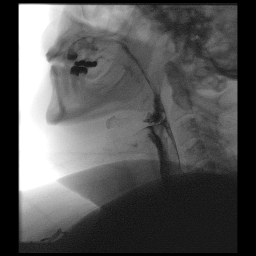
[frame 261/307]
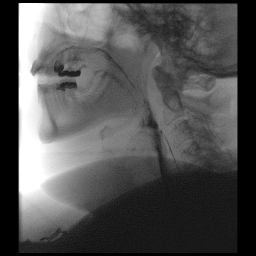

[24 of 24 positions shown; findings below may reference images not displayed]

FLUOROSCOPY FOR SWALLOWING FUNCTION STUDY:
Fluoroscopy was provided for swallowing function study, which was administered by a speech pathologist.  Final results and recommendations from this study are contained within the speech pathology report.

## 2018-04-10 ENCOUNTER — Emergency Department (HOSPITAL_COMMUNITY): Payer: Medicare Other

## 2018-04-10 ENCOUNTER — Inpatient Hospital Stay (HOSPITAL_COMMUNITY)
Admission: EM | Admit: 2018-04-10 | Discharge: 2018-04-16 | DRG: 190 | Disposition: A | Discharge status: Home Health | Payer: Medicare Other | Attending: Family Medicine | Admitting: Family Medicine

## 2018-04-10 ENCOUNTER — Encounter (HOSPITAL_COMMUNITY): Payer: Self-pay | Admitting: Emergency Medicine

## 2018-04-10 DIAGNOSIS — Z881 Allergy status to other antibiotic agents status: Secondary | ICD-10-CM

## 2018-04-10 DIAGNOSIS — F1390 Sedative, hypnotic, or anxiolytic use, unspecified, uncomplicated: Secondary | ICD-10-CM | POA: Diagnosis present

## 2018-04-10 DIAGNOSIS — K8681 Exocrine pancreatic insufficiency: Secondary | ICD-10-CM | POA: Diagnosis present

## 2018-04-10 DIAGNOSIS — J440 Chronic obstructive pulmonary disease with acute lower respiratory infection: Secondary | ICD-10-CM | POA: Diagnosis present

## 2018-04-10 DIAGNOSIS — F419 Anxiety disorder, unspecified: Secondary | ICD-10-CM | POA: Diagnosis present

## 2018-04-10 DIAGNOSIS — G8929 Other chronic pain: Secondary | ICD-10-CM | POA: Diagnosis present

## 2018-04-10 DIAGNOSIS — S0181XA Laceration without foreign body of other part of head, initial encounter: Secondary | ICD-10-CM | POA: Diagnosis present

## 2018-04-10 DIAGNOSIS — Z23 Encounter for immunization: Secondary | ICD-10-CM

## 2018-04-10 DIAGNOSIS — F17210 Nicotine dependence, cigarettes, uncomplicated: Secondary | ICD-10-CM | POA: Diagnosis present

## 2018-04-10 DIAGNOSIS — J441 Chronic obstructive pulmonary disease with (acute) exacerbation: Principal | ICD-10-CM | POA: Diagnosis present

## 2018-04-10 DIAGNOSIS — E669 Obesity, unspecified: Secondary | ICD-10-CM | POA: Diagnosis present

## 2018-04-10 DIAGNOSIS — Z79899 Other long term (current) drug therapy: Secondary | ICD-10-CM

## 2018-04-10 DIAGNOSIS — K449 Diaphragmatic hernia without obstruction or gangrene: Secondary | ICD-10-CM | POA: Diagnosis present

## 2018-04-10 DIAGNOSIS — T483X1A Poisoning by antitussives, accidental (unintentional), initial encounter: Secondary | ICD-10-CM | POA: Diagnosis present

## 2018-04-10 DIAGNOSIS — K861 Other chronic pancreatitis: Secondary | ICD-10-CM | POA: Diagnosis present

## 2018-04-10 DIAGNOSIS — I959 Hypotension, unspecified: Secondary | ICD-10-CM | POA: Diagnosis present

## 2018-04-10 DIAGNOSIS — J9601 Acute respiratory failure with hypoxia: Secondary | ICD-10-CM | POA: Diagnosis present

## 2018-04-10 DIAGNOSIS — I119 Hypertensive heart disease without heart failure: Secondary | ICD-10-CM | POA: Diagnosis present

## 2018-04-10 DIAGNOSIS — Z880 Allergy status to penicillin: Secondary | ICD-10-CM

## 2018-04-10 DIAGNOSIS — I10 Essential (primary) hypertension: Secondary | ICD-10-CM | POA: Diagnosis not present

## 2018-04-10 DIAGNOSIS — E663 Overweight: Secondary | ICD-10-CM | POA: Diagnosis present

## 2018-04-10 DIAGNOSIS — R0902 Hypoxemia: Secondary | ICD-10-CM | POA: Diagnosis not present

## 2018-04-10 DIAGNOSIS — Z7982 Long term (current) use of aspirin: Secondary | ICD-10-CM

## 2018-04-10 DIAGNOSIS — Z888 Allergy status to other drugs, medicaments and biological substances status: Secondary | ICD-10-CM

## 2018-04-10 DIAGNOSIS — G4700 Insomnia, unspecified: Secondary | ICD-10-CM | POA: Diagnosis present

## 2018-04-10 DIAGNOSIS — E7849 Other hyperlipidemia: Secondary | ICD-10-CM | POA: Diagnosis not present

## 2018-04-10 DIAGNOSIS — F319 Bipolar disorder, unspecified: Secondary | ICD-10-CM | POA: Diagnosis present

## 2018-04-10 DIAGNOSIS — E785 Hyperlipidemia, unspecified: Secondary | ICD-10-CM | POA: Diagnosis present

## 2018-04-10 DIAGNOSIS — F314 Bipolar disorder, current episode depressed, severe, without psychotic features: Secondary | ICD-10-CM | POA: Diagnosis present

## 2018-04-10 DIAGNOSIS — G9340 Encephalopathy, unspecified: Secondary | ICD-10-CM | POA: Diagnosis present

## 2018-04-10 DIAGNOSIS — Z8249 Family history of ischemic heart disease and other diseases of the circulatory system: Secondary | ICD-10-CM

## 2018-04-10 DIAGNOSIS — W19XXXA Unspecified fall, initial encounter: Secondary | ICD-10-CM | POA: Diagnosis not present

## 2018-04-10 DIAGNOSIS — M797 Fibromyalgia: Secondary | ICD-10-CM | POA: Diagnosis present

## 2018-04-10 DIAGNOSIS — G9349 Other encephalopathy: Secondary | ICD-10-CM | POA: Diagnosis present

## 2018-04-10 DIAGNOSIS — Z6827 Body mass index (BMI) 27.0-27.9, adult: Secondary | ICD-10-CM

## 2018-04-10 DIAGNOSIS — F3170 Bipolar disorder, currently in remission, most recent episode unspecified: Secondary | ICD-10-CM | POA: Diagnosis not present

## 2018-04-10 DIAGNOSIS — J209 Acute bronchitis, unspecified: Secondary | ICD-10-CM | POA: Diagnosis present

## 2018-04-10 DIAGNOSIS — K219 Gastro-esophageal reflux disease without esophagitis: Secondary | ICD-10-CM | POA: Diagnosis present

## 2018-04-10 DIAGNOSIS — Z716 Tobacco abuse counseling: Secondary | ICD-10-CM

## 2018-04-10 DIAGNOSIS — Z8673 Personal history of transient ischemic attack (TIA), and cerebral infarction without residual deficits: Secondary | ICD-10-CM

## 2018-04-10 LAB — CBC WITH DIFFERENTIAL/PLATELET
Abs Immature Granulocytes: 0.04 10*3/uL (ref 0.00–0.07)
Basophils Absolute: 0.1 10*3/uL (ref 0.0–0.1)
Basophils Relative: 1 %
EOS ABS: 0.1 10*3/uL (ref 0.0–0.5)
Eosinophils Relative: 1 %
HCT: 38.5 % (ref 36.0–46.0)
Hemoglobin: 11 g/dL — ABNORMAL LOW (ref 12.0–15.0)
Immature Granulocytes: 0 %
Lymphocytes Relative: 17 %
Lymphs Abs: 1.9 10*3/uL (ref 0.7–4.0)
MCH: 25.4 pg — ABNORMAL LOW (ref 26.0–34.0)
MCHC: 28.6 g/dL — ABNORMAL LOW (ref 30.0–36.0)
MCV: 88.9 fL (ref 80.0–100.0)
Monocytes Absolute: 1 10*3/uL (ref 0.1–1.0)
Monocytes Relative: 9 %
Neutro Abs: 8.3 10*3/uL — ABNORMAL HIGH (ref 1.7–7.7)
Neutrophils Relative %: 72 %
Platelets: 214 10*3/uL (ref 150–400)
RBC: 4.33 MIL/uL (ref 3.87–5.11)
RDW: 15.1 % (ref 11.5–15.5)
WBC: 11.4 10*3/uL — ABNORMAL HIGH (ref 4.0–10.5)
nRBC: 0 % (ref 0.0–0.2)

## 2018-04-10 LAB — URINALYSIS, ROUTINE W REFLEX MICROSCOPIC
Bacteria, UA: NONE SEEN
Bilirubin Urine: NEGATIVE
Glucose, UA: NEGATIVE mg/dL
HGB URINE DIPSTICK: NEGATIVE
Ketones, ur: NEGATIVE mg/dL
LEUKOCYTES UA: NEGATIVE
NITRITE: NEGATIVE
PROTEIN: 100 mg/dL — AB
SPECIFIC GRAVITY, URINE: 1.024 (ref 1.005–1.030)
pH: 5 (ref 5.0–8.0)

## 2018-04-10 LAB — COMPREHENSIVE METABOLIC PANEL
ALBUMIN: 2.9 g/dL — AB (ref 3.5–5.0)
ALT: 18 U/L (ref 0–44)
AST: 23 U/L (ref 15–41)
Alkaline Phosphatase: 79 U/L (ref 38–126)
Anion gap: 6 (ref 5–15)
BUN: 16 mg/dL (ref 6–20)
CHLORIDE: 104 mmol/L (ref 98–111)
CO2: 26 mmol/L (ref 22–32)
Calcium: 8.7 mg/dL — ABNORMAL LOW (ref 8.9–10.3)
Creatinine, Ser: 1.09 mg/dL — ABNORMAL HIGH (ref 0.44–1.00)
GFR calc Af Amer: >60 mL/min (ref >60)
GFR, EST NON AFRICAN AMERICAN: 56 mL/min — AB (ref >60)
GLUCOSE: 119 mg/dL — AB (ref 70–99)
POTASSIUM: 4.4 mmol/L (ref 3.5–5.1)
SODIUM: 136 mmol/L (ref 135–145)
Total Bilirubin: 0.1 mg/dL — ABNORMAL LOW (ref 0.3–1.2)
Total Protein: 6.5 g/dL (ref 6.5–8.1)

## 2018-04-10 LAB — RAPID URINE DRUG SCREEN, HOSP PERFORMED
Amphetamines: NOT DETECTED
Barbiturates: NOT DETECTED
Benzodiazepines: POSITIVE — AB
Cocaine: NOT DETECTED
Opiates: POSITIVE — AB
Tetrahydrocannabinol: NOT DETECTED

## 2018-04-10 LAB — I-STAT ARTERIAL BLOOD GAS, ED
Bicarbonate: 27 mmol/L (ref 20.0–28.0)
O2 Saturation: 94 %
PH ART: 7.307 — AB (ref 7.350–7.450)
Patient temperature: 98.6
TCO2: 29 mmol/L (ref 22–32)
pCO2 arterial: 53.9 mmHg — ABNORMAL HIGH (ref 32.0–48.0)
pO2, Arterial: 78 mmHg — ABNORMAL LOW (ref 83.0–108.0)

## 2018-04-10 LAB — I-STAT TROPONIN, ED: Troponin i, poc: 0.01 ng/mL (ref 0.00–0.08)

## 2018-04-10 LAB — BRAIN NATRIURETIC PEPTIDE: B NATRIURETIC PEPTIDE 5: 81.6 pg/mL (ref 0.0–100.0)

## 2018-04-10 LAB — INFLUENZA PANEL BY PCR (TYPE A & B)
Influenza A By PCR: NEGATIVE
Influenza B By PCR: NEGATIVE

## 2018-04-10 LAB — AMMONIA: Ammonia: 33 umol/L (ref 9–35)

## 2018-04-10 LAB — I-STAT CG4 LACTIC ACID, ED: Lactic Acid, Venous: 1.83 mmol/L (ref 0.5–1.9)

## 2018-04-10 LAB — ETHANOL: Alcohol, Ethyl (B): <10 mg/dL (ref <10)

## 2018-04-10 MED ORDER — PREDNISONE 20 MG PO TABS
40.0000 mg | ORAL_TABLET | Freq: Every day | ORAL | Status: DC
  Administered 2018-04-11 – 2018-04-12 (×3): 40 mg via ORAL
  Filled 2018-04-10 (×3): qty 2

## 2018-04-10 MED ORDER — ASPIRIN EC 325 MG PO TBEC
325.0000 mg | DELAYED_RELEASE_TABLET | Freq: Every day | ORAL | Status: DC
  Administered 2018-04-11 – 2018-04-16 (×6): 325 mg via ORAL
  Filled 2018-04-10 (×6): qty 1

## 2018-04-10 MED ORDER — PANCRELIPASE (LIP-PROT-AMYL) 36000-114000 UNITS PO CPEP
36000.0000 [IU] | ORAL_CAPSULE | Freq: Three times a day (TID) | ORAL | Status: DC
  Administered 2018-04-12 – 2018-04-16 (×14): 36000 [IU] via ORAL
  Filled 2018-04-10 (×18): qty 1

## 2018-04-10 MED ORDER — SODIUM CHLORIDE 0.9 % IV BOLUS
2000.0000 mL | Freq: Once | INTRAVENOUS | Status: AC
  Administered 2018-04-10: 2000 mL via INTRAVENOUS

## 2018-04-10 MED ORDER — NALOXONE HCL 0.4 MG/ML IJ SOLN
0.4000 mg | Freq: Once | INTRAMUSCULAR | Status: AC
  Administered 2018-04-10: 0.4 mg via INTRAVENOUS
  Filled 2018-04-10: qty 1

## 2018-04-10 MED ORDER — DICYCLOMINE HCL 10 MG PO CAPS
10.0000 mg | ORAL_CAPSULE | Freq: Three times a day (TID) | ORAL | Status: DC
  Administered 2018-04-11 – 2018-04-16 (×16): 10 mg via ORAL
  Filled 2018-04-10 (×18): qty 1

## 2018-04-10 MED ORDER — ALBUTEROL SULFATE (2.5 MG/3ML) 0.083% IN NEBU: 2.5000 mg | INHALATION_SOLUTION | RESPIRATORY_TRACT | Status: DC | PRN

## 2018-04-10 MED ORDER — ALBUTEROL SULFATE (2.5 MG/3ML) 0.083% IN NEBU
5.0000 mg | INHALATION_SOLUTION | Freq: Once | RESPIRATORY_TRACT | Status: AC
  Administered 2018-04-10: 5 mg via RESPIRATORY_TRACT

## 2018-04-10 MED ORDER — HYDROXYZINE HCL 25 MG PO TABS
25.0000 mg | ORAL_TABLET | Freq: Three times a day (TID) | ORAL | Status: DC
  Administered 2018-04-11 – 2018-04-12 (×6): 25 mg via ORAL
  Filled 2018-04-10 (×7): qty 1

## 2018-04-10 MED ORDER — IOPAMIDOL (ISOVUE-370) INJECTION 76%
INTRAVENOUS | Status: AC
  Filled 2018-04-10: qty 100

## 2018-04-10 MED ORDER — OXYCODONE-ACETAMINOPHEN 5-325 MG PO TABS
1.0000 | ORAL_TABLET | Freq: Three times a day (TID) | ORAL | Status: DC | PRN
  Administered 2018-04-12 – 2018-04-16 (×10): 2 via ORAL
  Filled 2018-04-10 (×11): qty 2

## 2018-04-10 MED ORDER — ALPRAZOLAM 0.25 MG PO TABS
1.0000 mg | ORAL_TABLET | Freq: Three times a day (TID) | ORAL | Status: DC | PRN
  Administered 2018-04-12: 2 mg via ORAL
  Administered 2018-04-13: 1 mg via ORAL
  Administered 2018-04-13 – 2018-04-14 (×4): 2 mg via ORAL
  Administered 2018-04-14: 1 mg via ORAL
  Administered 2018-04-15 – 2018-04-16 (×3): 2 mg via ORAL
  Filled 2018-04-10 (×2): qty 8
  Filled 2018-04-10: qty 4
  Filled 2018-04-10: qty 8
  Filled 2018-04-10: qty 4
  Filled 2018-04-10 (×6): qty 8

## 2018-04-10 MED ORDER — FLUOXETINE HCL 20 MG PO CAPS
40.0000 mg | ORAL_CAPSULE | Freq: Every day | ORAL | Status: DC
  Administered 2018-04-12 – 2018-04-16 (×5): 40 mg via ORAL
  Filled 2018-04-10 (×6): qty 2

## 2018-04-10 MED ORDER — IOPAMIDOL (ISOVUE-370) INJECTION 76%
100.0000 mL | Freq: Once | INTRAVENOUS | Status: AC | PRN
  Administered 2018-04-10: 100 mL via INTRAVENOUS

## 2018-04-10 MED ORDER — ASENAPINE MALEATE 5 MG SL SUBL
10.0000 mg | SUBLINGUAL_TABLET | Freq: Every evening | SUBLINGUAL | Status: DC | PRN
  Administered 2018-04-11 – 2018-04-16 (×6): 10 mg via SUBLINGUAL
  Filled 2018-04-10 (×8): qty 2

## 2018-04-10 MED ORDER — NAPROXEN 250 MG PO TABS
500.0000 mg | ORAL_TABLET | Freq: Two times a day (BID) | ORAL | Status: DC
  Administered 2018-04-11 – 2018-04-12 (×3): 500 mg via ORAL
  Filled 2018-04-10 (×5): qty 2

## 2018-04-10 MED ORDER — SODIUM CHLORIDE 0.9 % IV SOLN
1.0000 g | Freq: Every day | INTRAVENOUS | Status: AC
  Administered 2018-04-11 – 2018-04-14 (×5): 1 g via INTRAVENOUS
  Filled 2018-04-10 (×5): qty 10

## 2018-04-10 MED ORDER — OXYBUTYNIN CHLORIDE ER 15 MG PO TB24
15.0000 mg | ORAL_TABLET | Freq: Every day | ORAL | Status: DC
  Administered 2018-04-11 – 2018-04-15 (×5): 15 mg via ORAL
  Filled 2018-04-10 (×6): qty 1

## 2018-04-10 MED ORDER — LACTATED RINGERS IV SOLN
INTRAVENOUS | Status: DC
  Administered 2018-04-10: 21:00:00 via INTRAVENOUS

## 2018-04-10 MED ORDER — BUPROPION HCL ER (XL) 150 MG PO TB24
150.0000 mg | ORAL_TABLET | Freq: Every day | ORAL | Status: DC
  Administered 2018-04-11 – 2018-04-16 (×6): 150 mg via ORAL
  Filled 2018-04-10 (×6): qty 1

## 2018-04-10 MED ORDER — IPRATROPIUM-ALBUTEROL 0.5-2.5 (3) MG/3ML IN SOLN
3.0000 mL | Freq: Four times a day (QID) | RESPIRATORY_TRACT | Status: DC
  Administered 2018-04-11 – 2018-04-15 (×17): 3 mL via RESPIRATORY_TRACT
  Filled 2018-04-10 (×19): qty 3

## 2018-04-10 MED ORDER — MOMETASONE FURO-FORMOTEROL FUM 200-5 MCG/ACT IN AERO
1.0000 | INHALATION_SPRAY | Freq: Two times a day (BID) | RESPIRATORY_TRACT | Status: DC
  Administered 2018-04-11 – 2018-04-16 (×9): 1 via RESPIRATORY_TRACT
  Filled 2018-04-10 (×2): qty 8.8

## 2018-04-10 MED ORDER — SIMVASTATIN 20 MG PO TABS
40.0000 mg | ORAL_TABLET | Freq: Every day | ORAL | Status: DC
  Administered 2018-04-11 – 2018-04-15 (×6): 40 mg via ORAL
  Filled 2018-04-10 (×6): qty 2

## 2018-04-10 MED ORDER — ALBUTEROL (5 MG/ML) CONTINUOUS INHALATION SOLN
15.0000 mg/h | INHALATION_SOLUTION | RESPIRATORY_TRACT | Status: DC
  Administered 2018-04-10: 15 mg/h via RESPIRATORY_TRACT
  Filled 2018-04-10: qty 20
  Filled 2018-04-10: qty 40

## 2018-04-10 MED ORDER — GABAPENTIN 400 MG PO CAPS
800.0000 mg | ORAL_CAPSULE | Freq: Four times a day (QID) | ORAL | Status: DC
  Administered 2018-04-11 – 2018-04-12 (×6): 800 mg via ORAL
  Filled 2018-04-10 (×7): qty 2

## 2018-04-10 MED ORDER — IPRATROPIUM-ALBUTEROL 0.5-2.5 (3) MG/3ML IN SOLN
3.0000 mL | Freq: Once | RESPIRATORY_TRACT | Status: AC
  Administered 2018-04-10: 3 mL via RESPIRATORY_TRACT
  Filled 2018-04-10: qty 3

## 2018-04-10 MED ORDER — CLOMIPRAMINE HCL 25 MG PO CAPS
50.0000 mg | ORAL_CAPSULE | Freq: Every day | ORAL | Status: DC
  Administered 2018-04-11 – 2018-04-15 (×5): 50 mg via ORAL
  Filled 2018-04-10 (×7): qty 2

## 2018-04-10 MED ORDER — PANTOPRAZOLE SODIUM 40 MG PO TBEC
40.0000 mg | DELAYED_RELEASE_TABLET | Freq: Every day | ORAL | Status: DC
  Administered 2018-04-11 – 2018-04-16 (×6): 40 mg via ORAL
  Filled 2018-04-10: qty 1
  Filled 2018-04-10: qty 2
  Filled 2018-04-10 (×4): qty 1

## 2018-04-10 MED ORDER — ENOXAPARIN SODIUM 40 MG/0.4ML ~~LOC~~ SOLN
40.0000 mg | Freq: Every day | SUBCUTANEOUS | Status: DC
  Administered 2018-04-12 – 2018-04-16 (×5): 40 mg via SUBCUTANEOUS
  Filled 2018-04-10 (×6): qty 0.4

## 2018-04-10 MED ORDER — ATENOLOL 25 MG PO TABS
50.0000 mg | ORAL_TABLET | Freq: Two times a day (BID) | ORAL | Status: DC
  Administered 2018-04-11 – 2018-04-16 (×12): 50 mg via ORAL
  Filled 2018-04-10: qty 1
  Filled 2018-04-10 (×2): qty 2
  Filled 2018-04-10: qty 1
  Filled 2018-04-10 (×8): qty 2

## 2018-04-10 NOTE — ED Notes (Signed)
Patient refuses to leave nasal cannula on, "states it hurts my nose". Given venturi mask at 8L.

## 2018-04-10 NOTE — ED Notes (Addendum)
Patient O2 sats 87%, refuses to keep venturi mask on. States "it hurts my nose". Asked patient to please at least hold it up to her face since her oxygen saturation is low.   Patient non-compliant. MD made aware.

## 2018-04-10 NOTE — ED Provider Notes (Signed)
Emergency Department Provider Note   I have reviewed the triage vital signs and the nursing notes.   HISTORY  Chief Complaint Fall and Shortness of Breath   HPI Tammy Hines is a 58 y.o. female with PMH of asthma, Bipolar disorder, COPD, Hep B, GERD, HLD, and HTN presents to the emergency department for evaluation of bronchitis type symptoms for the past several days and acute onset lightheadedness with 2 syncope events.  Patient arrives by private vehicle.  She is somewhat somnolent on arrival which limits an extensive history but she is able to tell me that she is having cough and congestion symptoms over the past several days.  She fell twice at home once landing on her buttocks and once striking her face.  She did take oxycodone 10 mg at noon but that is her typical dose.  She denies using other drugs or drinking alcohol.  She denies fevers or chills.  She is not having chest pain or abdominal discomfort.   Level 5 caveat: Somnolence.   Past Medical History:  Diagnosis Date  . Anxiety   . Arthritis    "right knee, left hip, going down my neck into my right shoulder" (06/24/2016)  . Asthma   . Bipolar 1 disorder (HCC)   . Chronic lower back pain   . Chronic neck pain   . COPD (chronic obstructive pulmonary disease) (HCC)   . Depression    [name]/notes 06/24/2016  . Family history of adverse reaction to anesthesia    "makes my mom throw up"   . Fibromyalgia   . GERD (gastroesophageal reflux disease)    [name]/notes 06/24/2016  . Hepatitis B   . History of hiatal hernia   . History of stomach ulcers   . History of transient ischemic attack (TIA)    /RN 06/24/2016  . Hyperlipidemia   . Hypertension   . Ischemic stroke (HCC)    acute/notes 06/24/2016; "left sided weakness"/RN (06/24/2016  . Migraine    "a few/year now" (06/24/2016)  . Obesity   . PONV (postoperative nausea and vomiting)     Patient Active Problem List   Diagnosis Date Noted  . Acute respiratory failure with  hypoxia (HCC) 04/10/2018  . Hammer toes of both feet 02/28/2018  . MDD (major depressive disorder), recurrent severe, without psychosis (HCC) 03/11/2017  . Bipolar 1 disorder, depressed, severe (HCC) 03/11/2017  . Acute encephalopathy 11/28/2016  . Stroke (cerebrum) (HCC)   . Acute ischemic stroke (HCC) 06/24/2016  . Bipolar disorder (HCC) 06/24/2016  . Hypertension 06/24/2016  . Hyperlipidemia 06/24/2016  . Anxiety 06/24/2016  . Facial droop   . GERD without esophagitis   . SORE THROAT 12/04/2008  . VIRAL URI 03/04/2008  . BLURRED VISION 10/22/2007  . FATIGUE 10/22/2007  . DISC DISEASE, LUMBAR 09/06/2007  . COUGH 04/20/2007  . Depression 11/19/2006  . Asthma 11/19/2006  . GERD 11/19/2006  . HEADACHE 11/19/2006  . HEPATITIS B, HX OF 11/19/2006    Past Surgical History:  Procedure Laterality Date  . ANAL FISSURE REPAIR    . FRACTURE SURGERY    . LOOP RECORDER INSERTION N/A 06/26/2016   Procedure: Loop Recorder Insertion;  Surgeon: Hillis RangeJames Allred, MD;  Location: [facility] INVASIVE [facility];  Service: Cardiovascular;  Laterality: N/A;  . MULTIPLE TOOTH EXTRACTIONS  2018   "I had 6 teeth pulled"  . SHOULDER ARTHROSCOPY W/ ROTATOR CUFF REPAIR Right 2000  . SHOULDER SURGERY Right 1999  . TEE WITHOUT CARDIOVERSION N/A 06/26/2016  Procedure: TRANSESOPHAGEAL ECHOCARDIOGRAM (TEE);  Surgeon: Thurmon Fair, MD;  Location: [facility] ENDOSCOPY;  Service: Cardiovascular;  Laterality: N/A;    Allergies Ampicillin; Cleocin [clindamycin hcl]; Clonazepam; Other; and Penicillins  Family History  Problem Relation Age of Onset  . Hypertension Mother   . Cancer Father        esophageal  . Heart attack Maternal Grandfather     Social History Social History   Tobacco Use  . Smoking status: Current Every Day Smoker    Packs/day: 0.25    Years: 25.00    Pack years: 6.25    Types: Cigarettes  . Smokeless tobacco: Never Used  Substance Use Topics  . Alcohol use: Yes    Alcohol/week: 1.0 standard  drinks    Types: 1 Glasses of wine per week    Comment: "I drank one smironoff ice before I came) 12/04  . Drug use: No    Review of Systems  Constitutional: No fever/chills. Positive lightheadedness.  Eyes: No visual changes. ENT: No sore throat. Cardiovascular: Denies chest pain. Respiratory: Positive shortness of breath and cough/congestion symptoms.  Gastrointestinal: No abdominal pain.  No nausea, no vomiting.  No diarrhea.  No constipation. Genitourinary: Negative for dysuria. Musculoskeletal: Negative for back pain. Skin: Negative for rash. Neurological: Negative for headaches, focal weakness or numbness.  10-point ROS otherwise negative.  ____________________________________________   PHYSICAL EXAM:  VITAL SIGNS: ED Triage Vitals  Enc Vitals Group     BP 04/10/18 1507 108/70     Pulse Rate 04/10/18 1507 70     Resp 04/10/18 1507 16     Temp 04/10/18 1507 98.1 F (36.7 C)     Temp Source 04/10/18 1507 Oral     SpO2 04/10/18 1507 (!) 80 %     Weight 04/10/18 1521 173 lb (78.5 kg)     Height 04/10/18 1521 5\' 7"  (1.702 m)     Pain Score 04/10/18 1521 0   Constitutional: Somnolent but arouses to voice and provides a brief history.  Eyes: Conjunctivae are normal. Pupils are slightly dilated bilaterally at 41mm and reactive.  Head: Atraumatic. Nose: No congestion/rhinnorhea. Mouth/Throat: Mucous membranes are dry.  Neck: No stridor.  Cardiovascular: Normal rate, regular rhythm. Good peripheral circulation. Grossly normal heart sounds.   Respiratory: Normal respiratory effort.  No retractions. Lungs CTAB. Gastrointestinal: Soft and nontender. No distention.  Musculoskeletal: No lower extremity tenderness nor edema. No gross deformities of extremities. Neurologic: Drowsy but arouses to voice. Normal strength in the upper and lower extremities. No face droop. CN exam 2-12 normal.  Skin:  Skin is warm, dry and intact. No rash  noted.  ____________________________________________   LABS (all labs ordered are listed, but only abnormal results are displayed)  Labs Reviewed  COMPREHENSIVE METABOLIC PANEL - Abnormal; Notable for the following components:      Result Value   Glucose, Bld 119 (*)    Creatinine, Ser 1.09 (*)    Calcium 8.7 (*)    Albumin 2.9 (*)    Total Bilirubin 0.1 (*)    GFR calc non Af Amer 56 (*)    All other components within normal limits  URINALYSIS, ROUTINE W REFLEX MICROSCOPIC - Abnormal; Notable for the following components:   Color, Urine AMBER (*)    APPearance CLOUDY (*)    Protein, ur 100 (*)    All other components within normal limits  RAPID URINE DRUG SCREEN, HOSP PERFORMED - Abnormal; Notable for the following components:   Opiates POSITIVE (*)  Benzodiazepines POSITIVE (*)    All other components within normal limits  CBC WITH DIFFERENTIAL/PLATELET - Abnormal; Notable for the following components:   WBC 11.4 (*)    Hemoglobin 11.0 (*)    MCH 25.4 (*)    MCHC 28.6 (*)    Neutro Abs 8.3 (*)    All other components within normal limits  I-STAT ARTERIAL BLOOD GAS, ED - Abnormal; Notable for the following components:   pH, Arterial 7.307 (*)    pCO2 arterial 53.9 (*)    pO2, Arterial 78.0 (*)    All other components within normal limits  CULTURE, BLOOD (ROUTINE X 2)  CULTURE, BLOOD (ROUTINE X 2)  URINE CULTURE  BRAIN NATRIURETIC PEPTIDE  AMMONIA  ETHANOL  INFLUENZA PANEL BY PCR (TYPE A & B)  CBC WITH DIFFERENTIAL/PLATELET  HIV ANTIBODY (ROUTINE TESTING W REFLEX)  CBC  BASIC METABOLIC PANEL  I-STAT CG4 LACTIC ACID, ED  I-STAT TROPONIN, ED   ____________________________________________  EKG   EKG Interpretation  Date/Time:  Saturday April 10 2018 15:38:10 EST Ventricular Rate:  65 PR Interval:    QRS Duration: 105 QT Interval:  452 QTC Calculation: 470 R Axis:   -23 Text Interpretation:  Sinus rhythm Borderline left axis deviation Low voltage,  precordial leads No STEMI.  Confirmed by Alona BeneLong, Joshua [phone]([id number]) on 04/10/2018 4:01:09 PM       ____________________________________________  RADIOLOGY  Ct Head Wo Contrast  Result Date: 04/10/2018 CLINICAL DATA:  Larey SeatFell at home.  Facial lacerations. EXAM: CT HEAD WITHOUT CONTRAST CT CERVICAL SPINE WITHOUT CONTRAST TECHNIQUE: Multidetector CT imaging of the head and cervical spine was performed following the standard protocol without intravenous contrast. Multiplanar CT image reconstructions of the cervical spine were also generated. COMPARISON:  CT HEAD November 27, 2016 FINDINGS: CT HEAD FINDINGS BRAIN: No intraparenchymal hemorrhage, mass effect nor midline shift. The ventricles and sulci are normal. No acute large vascular territory infarcts. No abnormal extra-axial fluid collections. Basal cisterns are patent. Small parafalcine dural calcifications are unchanged. VASCULAR: Unremarkable. SKULL/SOFT TISSUES: No skull fracture. Small LEFT frontal scalp hematoma without subcutaneous gas or radiopaque foreign bodies. ORBITS/SINUSES: The included ocular globes and orbital contents are normal.Mild paranasal sinus mucosal thickening. Mastoid air cells are well aerated. OTHER: Patient is edentulous. CT CERVICAL SPINE FINDINGS ALIGNMENT: Straightened lordosis.  Grade 1 C4-5 anterolisthesis. SKULL BASE AND VERTEBRAE: Cervical vertebral bodies and posterior elements are intact. Moderate to severe C5-6 and C6-7 disc height loss, mild at C4-5 with proportional endplate spurring. Moderate to severe for cervical facet arthropathy. Calcified longus coli insertion. Moderate C1-2 osteoarthrosis. No destructive bony lesions. SOFT TISSUES AND SPINAL CANAL: Nonacute. 10 mm RIGHT thyroid nodule, below size follow-up recommendation by consensus criteria. DISC LEVELS: Mild C5-6 canal stenosis. Multilevel severe neural foraminal narrowing. UPPER CHEST: Biapical ground-glass opacities. OTHER: None. IMPRESSION: CT HEAD: 1. No acute  intracranial process. Small LEFT frontal scalp hematoma. 2. Otherwise negative noncontrast CT HEAD. CT CERVICAL SPINE: 1. No acute fracture. Grade 1 C4-5 anterolisthesis on a degenerative basis. 2. Multilevel severe neural foraminal narrowing. 3. Biapical lung ground-glass opacities seen with pulmonary edema or infection. Electronically Signed   By: Awilda Metroourtnay  Bloomer M.D.   On: 04/10/2018 20:32   Ct Cervical Spine Wo Contrast  Result Date: 04/10/2018 CLINICAL DATA:  Larey SeatFell at home.  Facial lacerations. EXAM: CT HEAD WITHOUT CONTRAST CT CERVICAL SPINE WITHOUT CONTRAST TECHNIQUE: Multidetector CT imaging of the head and cervical spine was performed following the standard protocol without intravenous contrast. Multiplanar CT image  reconstructions of the cervical spine were also generated. COMPARISON:  CT HEAD November 27, 2016 FINDINGS: CT HEAD FINDINGS BRAIN: No intraparenchymal hemorrhage, mass effect nor midline shift. The ventricles and sulci are normal. No acute large vascular territory infarcts. No abnormal extra-axial fluid collections. Basal cisterns are patent. Small parafalcine dural calcifications are unchanged. VASCULAR: Unremarkable. SKULL/SOFT TISSUES: No skull fracture. Small LEFT frontal scalp hematoma without subcutaneous gas or radiopaque foreign bodies. ORBITS/SINUSES: The included ocular globes and orbital contents are normal.Mild paranasal sinus mucosal thickening. Mastoid air cells are well aerated. OTHER: Patient is edentulous. CT CERVICAL SPINE FINDINGS ALIGNMENT: Straightened lordosis.  Grade 1 C4-5 anterolisthesis. SKULL BASE AND VERTEBRAE: Cervical vertebral bodies and posterior elements are intact. Moderate to severe C5-6 and C6-7 disc height loss, mild at C4-5 with proportional endplate spurring. Moderate to severe for cervical facet arthropathy. Calcified longus coli insertion. Moderate C1-2 osteoarthrosis. No destructive bony lesions. SOFT TISSUES AND SPINAL CANAL: Nonacute. 10 mm RIGHT  thyroid nodule, below size follow-up recommendation by consensus criteria. DISC LEVELS: Mild C5-6 canal stenosis. Multilevel severe neural foraminal narrowing. UPPER CHEST: Biapical ground-glass opacities. OTHER: None. IMPRESSION: CT HEAD: 1. No acute intracranial process. Small LEFT frontal scalp hematoma. 2. Otherwise negative noncontrast CT HEAD. CT CERVICAL SPINE: 1. No acute fracture. Grade 1 C4-5 anterolisthesis on a degenerative basis. 2. Multilevel severe neural foraminal narrowing. 3. Biapical lung ground-glass opacities seen with pulmonary edema or infection. Electronically Signed   By: Awilda Metro M.D.   On: 04/10/2018 20:32   Dg Chest Port 1 View  Result Date: 04/10/2018 CLINICAL DATA:  Cough for 2 days, fell onto buttocks trying to get into car then fell onto face trying to stand up again, history stroke, hypertension, COPD, asthma, smoker EXAM: PORTABLE CHEST 1 VIEW COMPARISON:  Portable exam 1546 hours compared to 08/02/2017 FINDINGS: Loop recorder projects over LEFT heart. Enlargement of cardiac silhouette. Mediastinal contours and pulmonary vascularity normal. Bibasilar atelectasis. Lungs otherwise clear. No pulmonary infiltrate, pleural effusion or pneumothorax. No additional 8 fracture identified at the anterior LEFT fourth rib. IMPRESSION: Enlargement of cardiac silhouette. Bibasilar atelectasis. Fractured anterior LEFT fourth rib. Electronically Signed   By: Ulyses Southward M.D.   On: 04/10/2018 16:08    ____________________________________________   PROCEDURES  Procedure(s) performed:   Procedures  CRITICAL CARE Performed by: Maia Plan Total critical care time: 75 minutes Critical care time was exclusive of separately billable procedures and treating other patients. Critical care was necessary to treat or prevent imminent or life-threatening deterioration. Critical care was time spent personally by me on the following activities: development of treatment plan with  patient and/or surrogate as well as nursing, discussions with consultants, evaluation of patient's response to treatment, examination of patient, obtaining history from patient or surrogate, ordering and performing treatments and interventions, ordering and review of laboratory studies, ordering and review of radiographic studies, pulse oximetry and re-evaluation of patient's condition.  Alona Bene, MD Emergency Medicine    Emergency Ultrasound Study:   Angiocath insertion Performed by: Maia Plan  Consent: Verbal consent obtained. Risks and benefits: risks, benefits and alternatives were discussed Immediately prior to procedure the correct patient, procedure, equipment, support staff and site/side marked as needed.  Indication: difficult IV access Preparation: Patient was prepped and draped in the usual sterile fashion. Vein Location: Right AC was visualized during assessment for potential access sites and was found to be patent/ easily compressed with linear ultrasound.  The needle was visualized with real-time ultrasound and guided into  the vein. Gauge: 20G long   Normal blood return.  Patient tolerance: Patient tolerated the procedure well with no immediate complications.   ____________________________________________   INITIAL IMPRESSION / ASSESSMENT AND PLAN / ED COURSE  Pertinent labs & imaging results that were available during my care of the patient were reviewed by me and considered in my medical decision making (see chart for details).  Patient presents to the emergency department with altered mental status.  She is hypoxic and hypotensive on arrival.  She tells me that she was having rhonchi this type symptoms over the past several days.  Patient is somnolent and slurring her words.  She does have hypoxemia and hypotension on arrival.  No fever.  Initial lactate is normal.  I will not start broad-spectrum antibiotics but have obtained blood culture.  Patient is  arousable and protecting her airway at this time.  She has a presentation most consistent with global encephalopathy as opposed to stroke.  In review of the record the patient had several ED presentations in 2018 with altered mental status ultimately thought to be due to polypharmacy.  Patient does have dilated pupils here.  I will be aggressive about ruling out other emergent medical conditions.  When the patient is more stable I do plan for CT imaging of the head, neck, and chest.   09:13 PM Patient's mental status is improving with time in the ED.  Patient lost her IV and was unable to obtain a CT angio.  Given her improved mental status and breathing I do not feel this is an emergent study and will defer at this time.  Her CT imaging of the head and cervical spine are negative.  Lactate is normal. Doubt sepsis or other infectious process. Patient hypotension is responding to IVF. No sign on exam of volume overload. CBC had to be re-sent so that is pending but plan on admit afterwards.   CBC with mild leukocytosis. No sepsis. No clinical concern for PNA. Symptoms improving with IVF and O2. Nebs helped earlier in the ED course. Patient without over fluid overload or increased WOB. No BiPAP at this time. Patient removing her Celoron O2 saying that it is hurting her nose. Will try mask O2 and admit for further monitoring. No CP or other symptoms to strongly suspect PE as above.   Discussed patient's case with Hospitalist, Dr. Julian Reil to request admission. Patient and family (if present) updated with plan. Care transferred to Hospitalist service.  I reviewed all nursing notes, vitals, pertinent old records, EKGs, labs, imaging (as available).  ____________________________________________  FINAL CLINICAL IMPRESSION(S) / ED DIAGNOSES  Final diagnoses:  Encephalopathy acute  Hypoxia  Hypotension, unspecified hypotension type     MEDICATIONS GIVEN DURING THIS VISIT:  Medications  albuterol  (PROVENTIL,VENTOLIN) solution continuous neb (15 mg/hr Nebulization New Bag/Given 04/10/18 1702)  iopamidol (ISOVUE-370) 76 % injection (has no administration in time range)  ALPRAZolam (XANAX) tablet 1-2 mg (has no administration in time range)  oxyCODONE-acetaminophen (PERCOCET/ROXICET) 5-325 MG per tablet 1-2 tablet (has no administration in time range)  enoxaparin (LOVENOX) injection 40 mg (has no administration in time range)  cefTRIAXone (ROCEPHIN) 1 g in sodium chloride 0.9 % 100 mL IVPB (has no administration in time range)  predniSONE (DELTASONE) tablet 40 mg (has no administration in time range)  mometasone-formoterol (DULERA) 200-5 MCG/ACT inhaler 1 puff (1 puff Inhalation Not Given 04/10/18 2348)  ipratropium-albuterol (DUONEB) 0.5-2.5 (3) MG/3ML nebulizer solution 3 mL (has no administration in time range)  albuterol (  PROVENTIL) (2.5 MG/3ML) 0.083% nebulizer solution 2.5 mg (has no administration in time range)  aspirin EC tablet 325 mg (has no administration in time range)  atenolol (TENORMIN) tablet 50 mg (has no administration in time range)  buPROPion (WELLBUTRIN XL) 24 hr tablet 150 mg (has no administration in time range)  lipase/protease/amylase (CREON) capsule 36,000 Units (has no administration in time range)  gabapentin (NEURONTIN) capsule 800 mg (has no administration in time range)  oxybutynin (DITROPAN XL) 24 hr tablet 15 mg (has no administration in time range)  FLUoxetine (PROZAC) capsule 40 mg (has no administration in time range)  clomiPRAMINE (ANAFRANIL) capsule 50 mg (has no administration in time range)  dicyclomine (BENTYL) capsule 10 mg (has no administration in time range)  hydrOXYzine (ATARAX/VISTARIL) tablet 25 mg (has no administration in time range)  naproxen (NAPROSYN) tablet 500 mg (has no administration in time range)  asenapine (SAPHRIS) sublingual tablet 10 mg (has no administration in time range)  pantoprazole (PROTONIX) EC tablet 40 mg (has no  administration in time range)  simvastatin (ZOCOR) tablet 40 mg (has no administration in time range)  ipratropium-albuterol (DUONEB) 0.5-2.5 (3) MG/3ML nebulizer solution 3 mL (3 mLs Nebulization Given 04/10/18 1609)  sodium chloride 0.9 % bolus 2,000 mL (0 mLs Intravenous Stopped 04/10/18 1723)  naloxone (NARCAN) injection 0.4 mg (0.4 mg Intravenous Given 04/10/18 1658)  iopamidol (ISOVUE-370) 76 % injection 100 mL (100 mLs Intravenous Contrast Given 04/10/18 1852)  albuterol (PROVENTIL) (2.5 MG/3ML) 0.083% nebulizer solution 5 mg (5 mg Nebulization Given 04/10/18 2256)    Note:  This document was prepared using Dragon voice recognition software and may include unintentional dictation errors.  Alona Bene, MD Emergency Medicine    Long, Arlyss Repress, MD 04/10/18 2352

## 2018-04-10 NOTE — ED Triage Notes (Signed)
Pt arrives with O2 sats in 80s on RA and reports that she fell at her house before coming here. Pt has abrasions to nose and face. Reports multiple falls. Pt reports taking her oxycodone today. Pt lethargic but responding to questions.

## 2018-04-10 NOTE — ED Notes (Signed)
Pt friend at bedside states she was called by pt to bring her here for a cough. Friend reports the pt fell on her butt trying to get to the car and then fell again on her face trying to stand up. EMS responded to pt but she refused for them to bring her in.

## 2018-04-10 NOTE — ED Notes (Signed)
Patient transported to CT 

## 2018-04-10 NOTE — H&P (Addendum)
History and Physical    Tammy Hines ZOX:[id number] DOB: 1961-02-19 DOA: 04/10/2018  PCP: Quitman Livings, MD  Patient coming from: Home  I have personally briefly reviewed patient's old medical records in [facility]  Chief Complaint: Syncope, bronchitis  HPI: Tammy Hines is a 58 y.o. female with medical history significant of COPD, fibromyalgia, BPD.  Patient presents to the ED with c/o bronchitis type symptoms for the past 2 days and today acute onset of light headedness with 2 syncope events.  She arrived by private vehicle rather than EMS.   ED Course: Patient was somewhat somnolent on arrival.  Mental status has improved with time during the ED stay.  She correctly identifies to me that her passing out today is likely because she took "a half bottle of delsym today" to treat her cough that she has had the past 2 days.  Worth noting she also takes other chronic meds with CNS depressant effects including large doses of Xanax, Percocet, and neurontin.  Regardless, patient now more alert and awake, though has new O2 requirement of 8L to maintain sats.  BNP is nl, CXR neg.   Review of Systems: As per HPI otherwise 10 point review of systems negative.   Past Medical History:  Diagnosis Date  . Anxiety   . Arthritis    "right knee, left hip, going down my neck into my right shoulder" (06/24/2016)  . Asthma   . Bipolar 1 disorder (HCC)   . Chronic lower back pain   . Chronic neck pain   . COPD (chronic obstructive pulmonary disease) (HCC)   . Depression    [name] 06/24/2016  . Family history of adverse reaction to anesthesia    "makes my mom throw up"   . Fibromyalgia   . GERD (gastroesophageal reflux disease)    [name] 06/24/2016  . Hepatitis B   . History of hiatal hernia   . History of stomach ulcers   . History of transient ischemic attack (TIA)    /RN 06/24/2016  . Hyperlipidemia   . Hypertension   . Ischemic stroke (HCC)    acute/notes 06/24/2016; "left  sided weakness"/RN (06/24/2016  . Migraine    "a few/year now" (06/24/2016)  . Obesity   . PONV (postoperative nausea and vomiting)     Past Surgical History:  Procedure Laterality Date  . ANAL FISSURE REPAIR    . FRACTURE SURGERY    . LOOP RECORDER INSERTION N/A 06/26/2016   Procedure: Loop Recorder Insertion;  Surgeon: Hillis Range, MD;  Location: [facility] INVASIVE CV LAB;  Service: Cardiovascular;  Laterality: N/A;  . MULTIPLE TOOTH EXTRACTIONS  2018   "I had 6 teeth pulled"  . SHOULDER ARTHROSCOPY W/ ROTATOR CUFF REPAIR Right 2000  . SHOULDER SURGERY Right 1999  . TEE WITHOUT CARDIOVERSION N/A 06/26/2016   Procedure: TRANSESOPHAGEAL ECHOCARDIOGRAM (TEE);  Surgeon: Thurmon Fair, MD;  Location: [facility] ENDOSCOPY;  Service: Cardiovascular;  Laterality: N/A;     reports that she has been smoking cigarettes. She has a 6.25 pack-year smoking history. She has never used smokeless tobacco. She reports current alcohol use of about 1.0 standard drinks of alcohol per week. She reports that she does not use drugs.  Allergies  Allergen Reactions  . Ampicillin Hives and Nausea And Vomiting    Has patient had a PCN reaction causing immediate rash, facial/tongue/throat swelling, SOB or lightheadedness with hypotension: No Has patient had a PCN reaction causing severe rash involving mucus membranes or skin necrosis: No  Has patient had a PCN reaction that required hospitalization No Has patient had a PCN reaction occurring within the last 10 years: No If all of the above answers are "NO", then may proceed with Cephalosporin use.   Marland Kitchen Cleocin [Clindamycin Hcl] Other (See Comments)    "deathly sick"  . Clonazepam Other (See Comments)    Unknown reaction  . Other Other (See Comments)    Allergy to mold, down and feathers per allergy test  . Penicillins Other (See Comments)    Has patient had a PCN reaction causing immediate rash, facial/tongue/throat swelling, SOB or lightheadedness with hypotension: No Has  patient had a PCN reaction causing severe rash involving mucus membranes or skin necrosis: No Has patient had a PCN reaction that required hospitalization No Has patient had a PCN reaction occurring within the last 10 years: No If all of the above answers are "NO", then may proceed with Cephalosporin use.   Pt told not to take because of reaction to ampicillin - no    Family History  Problem Relation Age of Onset  . Hypertension Mother   . Cancer Father        esophageal  . Heart attack Maternal Grandfather      Prior to Admission medications   Medication Sig Start Date End Date Taking? Authorizing Provider  albuterol (PROVENTIL HFA;VENTOLIN HFA) 108 (90 Base) MCG/ACT inhaler Inhale 2 puffs into the lungs every 6 (six) hours as needed for wheezing or shortness of breath.   Yes [provider]  alprazolam Prudy Feeler) 2 MG tablet Take 2 mg by mouth 3 (three) times daily.  02/18/18  Yes [provider]  aspirin EC 325 MG tablet Take 325 mg by mouth daily.   Yes [provider]  atenolol (TENORMIN) 50 MG tablet Take 100 mg by mouth at bedtime.  02/12/18  Yes [provider]  buPROPion (WELLBUTRIN XL) 150 MG 24 hr tablet Take 150 mg by mouth daily. 02/12/18  Yes [provider]  ciclopirox (PENLAC) 8 % solution Apply topically at bedtime. Apply over nail and surrounding skin. Apply daily over previous coat. After seven (7) days, may remove with alcohol and continue cycle. 02/26/18  Yes Vivi Barrack, DPM  clomiPRAMINE (ANAFRANIL) 50 MG capsule Take 50 mg by mouth at bedtime. 02/12/18  Yes [provider]  CREON 36000 units CPEP capsule Take 36,000 Units by mouth 3 (three) times daily with meals.  10/20/16  Yes [provider]  dicyclomine (BENTYL) 10 MG capsule Take 10 mg by mouth 3 (three) times daily before meals.    Yes [provider]  FLUoxetine (PROZAC) 40 MG capsule Take 40 mg by mouth daily. 02/12/18  Yes [provider]  gabapentin (NEURONTIN) 800 MG tablet Take 800 mg by mouth 4 (four) times daily.   Yes [provider]  hydrOXYzine (VISTARIL) 25 MG capsule Take 25 mg by mouth 4 (four) times daily. 02/12/18  Yes [provider]  Mirabegron (MYRBETRIQ PO) Take 1 tablet by mouth every evening.   Yes [provider]  naproxen (NAPROSYN) 500 MG tablet Take 500 mg by mouth 2 (two) times daily. 02/12/18  Yes [provider]  oxybutynin (DITROPAN XL) 15 MG 24 hr tablet Take 15 mg by mouth at bedtime.  10/20/16  Yes [provider]  oxyCODONE-acetaminophen (PERCOCET) 10-325 MG tablet Take 1 tablet by mouth 3 (three) times daily. 02/18/18  Yes [provider]  pantoprazole (PROTONIX) 40 MG tablet Take 40  mg by mouth daily.  10/20/16  Yes [provider]  SAPHRIS 10 MG SUBL Take 10 mg by mouth See admin instructions. Place one tablet (10 mg) under the tongue twice during the night 02/12/18  Yes [provider]  simvastatin (ZOCOR) 40 MG tablet Take 40 mg by mouth at bedtime.  11/17/16  Yes [provider]    Physical Exam: Vitals:   04/10/18 2130 04/10/18 2200 04/10/18 2230 04/10/18 2245  BP: 120/63 117/67 117/67 133/80  Pulse: 79 76 78 83  Resp: 20 19 (!) 21 20  Temp:      TempSrc:      SpO2: 93% 92% 90% 94%  Weight:      Height:        Constitutional: NAD, calm, comfortable Eyes: PERRL, lids and conjunctivae normal ENMT: Mucous membranes are moist. Posterior pharynx clear of any exudate or lesions.Normal dentition.  Neck: normal, supple, no masses, no thyromegaly Respiratory: Diffuse wheezes and rhonchi. Cardiovascular: Regular rate and rhythm, no murmurs / rubs / gallops. No extremity edema. 2+ pedal pulses. No carotid bruits.  Abdomen: no tenderness, no masses palpated. No hepatosplenomegaly. Bowel sounds positive.  Musculoskeletal: no clubbing / cyanosis. No joint deformity upper and lower extremities. Good ROM, no  contractures. Normal muscle tone.  Skin: no rashes, lesions, ulcers. No induration Neurologic: CN 2-12 grossly intact. Sensation intact, DTR normal. Strength 5/5 in all 4.  Psychiatric: Normal judgment and insight. Alert and oriented x 3. Normal mood.    Labs on Admission: I have personally reviewed following labs and imaging studies  CBC: Recent Labs  Lab 04/10/18 2115  WBC 11.4*  NEUTROABS 8.3*  HGB 11.0*  HCT 38.5  MCV 88.9  PLT 214   Basic Metabolic Panel: Recent Labs  Lab 04/10/18 1553  NA 136  K 4.4  CL 104  CO2 26  GLUCOSE 119*  BUN 16  CREATININE 1.09*  CALCIUM 8.7*   GFR: Estimated Creatinine Clearance: 60.7 mL/min (A) (by C-G formula based on SCr of 1.09 mg/dL (H)). Liver Function Tests: Recent Labs  Lab 04/10/18 1553  AST 23  ALT 18  ALKPHOS 79  BILITOT 0.1*  PROT 6.5  ALBUMIN 2.9*   No results for input(s): LIPASE, AMYLASE in the last 168 hours. Recent Labs  Lab 04/10/18 1608  AMMONIA 33   Coagulation Profile: No results for input(s): INR, PROTIME in the last 168 hours. Cardiac Enzymes: No results for input(s): CKTOTAL, CKMB, CKMBINDEX, TROPONINI in the last 168 hours. BNP (last 3 results) No results for input(s): PROBNP in the last 8760 hours. HbA1C: No results for input(s): HGBA1C in the last 72 hours. CBG: No results for input(s): GLUCAP in the last 168 hours. Lipid Profile: No results for input(s): CHOL, HDL, LDLCALC, TRIG, CHOLHDL, LDLDIRECT in the last 72 hours. Thyroid Function Tests: No results for input(s): TSH, T4TOTAL, FREET4, T3FREE, THYROIDAB in the last 72 hours. Anemia Panel: No results for input(s): VITAMINB12, FOLATE, FERRITIN, TIBC, IRON, RETICCTPCT in the last 72 hours. Urine analysis:    Component Value Date/Time   COLORURINE AMBER (A) 04/10/2018 1639   APPEARANCEUR CLOUDY (A) 04/10/2018 1639   LABSPEC 1.024 04/10/2018 1639   PHURINE 5.0 04/10/2018 1639   GLUCOSEU NEGATIVE 04/10/2018 1639   HGBUR NEGATIVE  04/10/2018 1639   BILIRUBINUR NEGATIVE 04/10/2018 1639   KETONESUR NEGATIVE 04/10/2018 1639   PROTEINUR 100 (A) 04/10/2018 1639   NITRITE NEGATIVE 04/10/2018 1639   LEUKOCYTESUR NEGATIVE 04/10/2018 1639    Radiological Exams on  Admission: Ct Head Wo Contrast  Result Date: 04/10/2018 CLINICAL DATA:  Larey Seat at home.  Facial lacerations. EXAM: CT HEAD WITHOUT CONTRAST CT CERVICAL SPINE WITHOUT CONTRAST TECHNIQUE: Multidetector CT imaging of the head and cervical spine was performed following the standard protocol without intravenous contrast. Multiplanar CT image reconstructions of the cervical spine were also generated. COMPARISON:  CT HEAD November 27, 2016 FINDINGS: CT HEAD FINDINGS BRAIN: No intraparenchymal hemorrhage, mass effect nor midline shift. The ventricles and sulci are normal. No acute large vascular territory infarcts. No abnormal extra-axial fluid collections. Basal cisterns are patent. Small parafalcine dural calcifications are unchanged. VASCULAR: Unremarkable. SKULL/SOFT TISSUES: No skull fracture. Small LEFT frontal scalp hematoma without subcutaneous gas or radiopaque foreign bodies. ORBITS/SINUSES: The included ocular globes and orbital contents are normal.Mild paranasal sinus mucosal thickening. Mastoid air cells are well aerated. OTHER: Patient is edentulous. CT CERVICAL SPINE FINDINGS ALIGNMENT: Straightened lordosis.  Grade 1 C4-5 anterolisthesis. SKULL BASE AND VERTEBRAE: Cervical vertebral bodies and posterior elements are intact. Moderate to severe C5-6 and C6-7 disc height loss, mild at C4-5 with proportional endplate spurring. Moderate to severe for cervical facet arthropathy. Calcified longus coli insertion. Moderate C1-2 osteoarthrosis. No destructive bony lesions. SOFT TISSUES AND SPINAL CANAL: Nonacute. 10 mm RIGHT thyroid nodule, below size follow-up recommendation by consensus criteria. DISC LEVELS: Mild C5-6 canal stenosis. Multilevel severe neural foraminal narrowing. UPPER  CHEST: Biapical ground-glass opacities. OTHER: None. IMPRESSION: CT HEAD: 1. No acute intracranial process. Small LEFT frontal scalp hematoma. 2. Otherwise negative noncontrast CT HEAD. CT CERVICAL SPINE: 1. No acute fracture. Grade 1 C4-5 anterolisthesis on a degenerative basis. 2. Multilevel severe neural foraminal narrowing. 3. Biapical lung ground-glass opacities seen with pulmonary edema or infection. Electronically Signed   By: Awilda Metro M.D.   On: 04/10/2018 20:32   Ct Cervical Spine Wo Contrast  Result Date: 04/10/2018 CLINICAL DATA:  Larey Seat at home.  Facial lacerations. EXAM: CT HEAD WITHOUT CONTRAST CT CERVICAL SPINE WITHOUT CONTRAST TECHNIQUE: Multidetector CT imaging of the head and cervical spine was performed following the standard protocol without intravenous contrast. Multiplanar CT image reconstructions of the cervical spine were also generated. COMPARISON:  CT HEAD November 27, 2016 FINDINGS: CT HEAD FINDINGS BRAIN: No intraparenchymal hemorrhage, mass effect nor midline shift. The ventricles and sulci are normal. No acute large vascular territory infarcts. No abnormal extra-axial fluid collections. Basal cisterns are patent. Small parafalcine dural calcifications are unchanged. VASCULAR: Unremarkable. SKULL/SOFT TISSUES: No skull fracture. Small LEFT frontal scalp hematoma without subcutaneous gas or radiopaque foreign bodies. ORBITS/SINUSES: The included ocular globes and orbital contents are normal.Mild paranasal sinus mucosal thickening. Mastoid air cells are well aerated. OTHER: Patient is edentulous. CT CERVICAL SPINE FINDINGS ALIGNMENT: Straightened lordosis.  Grade 1 C4-5 anterolisthesis. SKULL BASE AND VERTEBRAE: Cervical vertebral bodies and posterior elements are intact. Moderate to severe C5-6 and C6-7 disc height loss, mild at C4-5 with proportional endplate spurring. Moderate to severe for cervical facet arthropathy. Calcified longus coli insertion. Moderate C1-2  osteoarthrosis. No destructive bony lesions. SOFT TISSUES AND SPINAL CANAL: Nonacute. 10 mm RIGHT thyroid nodule, below size follow-up recommendation by consensus criteria. DISC LEVELS: Mild C5-6 canal stenosis. Multilevel severe neural foraminal narrowing. UPPER CHEST: Biapical ground-glass opacities. OTHER: None. IMPRESSION: CT HEAD: 1. No acute intracranial process. Small LEFT frontal scalp hematoma. 2. Otherwise negative noncontrast CT HEAD. CT CERVICAL SPINE: 1. No acute fracture. Grade 1 C4-5 anterolisthesis on a degenerative basis. 2. Multilevel severe neural foraminal narrowing. 3. Biapical lung ground-glass opacities seen with  pulmonary edema or infection. Electronically Signed   By: Awilda Metroourtnay  Bloomer M.D.   On: 04/10/2018 20:32   Dg Chest Port 1 View  Result Date: 04/10/2018 CLINICAL DATA:  Cough for 2 days, fell onto buttocks trying to get into car then fell onto face trying to stand up again, history stroke, hypertension, COPD, asthma, smoker EXAM: PORTABLE CHEST 1 VIEW COMPARISON:  Portable exam 1546 hours compared to 08/02/2017 FINDINGS: Loop recorder projects over LEFT heart. Enlargement of cardiac silhouette. Mediastinal contours and pulmonary vascularity normal. Bibasilar atelectasis. Lungs otherwise clear. No pulmonary infiltrate, pleural effusion or pneumothorax. No additional 8 fracture identified at the anterior LEFT fourth rib. IMPRESSION: Enlargement of cardiac silhouette. Bibasilar atelectasis. Fractured anterior LEFT fourth rib. Electronically Signed   By: Ulyses SouthwardMark  Boles M.D.   On: 04/10/2018 16:08    EKG: Independently reviewed.  Assessment/Plan Principal Problem:   Acute respiratory failure with hypoxia (HCC) Active Problems:   Bipolar disorder (HCC)   COPD with acute exacerbation (HCC)    1. Acute resp failure with hypoxia - 1. Significant new O2 requirement 2. Acute bronchitis / COPD exacerbation vs underlying PNA 3. CXR neg for PNA 4. Will put on COPD  pathway 5. Prednisone 6. Rocephin 7. PRN and scheduled nebs 8. Adult wheeze protocol 9. Cont pulse ox and tele monitor 10. Check RVP - influenza is neg 11. Repeat CXR in AM 2. BPD - Cont home meds 3. Chronic pain - 1. Cont home meds, will put the xanax and opiates as a PRN including option for half dose given the presentation today 4. Accidental OD - 1. Suspected to be due to taking "half a bottle of delsym" today, this on top of other CNS depressants.  I see Delsym has a warning that it can cause "loss of consciousness" 2. Will hold off on further Delsym during admission  DVT prophylaxis: Lovenox Code Status: Full Family Communication: No family in room Disposition Plan: Home after admit Consults called: None Admission status: Admit to inpatient  Severity of Illness: The appropriate patient status for this patient is INPATIENT. Inpatient status is judged to be reasonable and necessary in order to provide the required intensity of service to ensure the patient's safety. The patient's presenting symptoms, physical exam findings, and initial radiographic and laboratory data in the context of their chronic comorbidities is felt to place them at high risk for further clinical deterioration. Furthermore, it is not anticipated that the patient will be medically stable for discharge from the hospital within 2 midnights of admission. The following factors support the patient status of inpatient.   " The patient's presenting symptoms include AMS, cough. " The worrisome physical exam findings include Hypoxia with new O2 requirement, wheezing. " The initial radiographic and laboratory data are worrisome because of New O2 requirement of 8L to maintain sats in the upper 80s low 90s. " The chronic co-morbidities include Chronic opiate and benzo use, chronic gabapentin use.   * I certify that at the point of admission it is my clinical judgment that the patient will require inpatient hospital care  spanning beyond 2 midnights from the point of admission due to high intensity of service, high risk for further deterioration and high frequency of surveillance required.Hillary Bow*    Chrisie Jankovich M. DO [facility] Pager [phone] Only works nights!  If 7AM-7PM, please contact the primary day team physician taking care of patient  [email or web address] Password [email or web address]  04/10/2018, 11:46 PM

## 2018-04-10 NOTE — ED Notes (Signed)
Patient is very difficult stick, IV started in hand but unable to obtain blood work. Dr. Jacqulyn Bath bedside to obtain blood and ultrasound IV.

## 2018-04-10 NOTE — ED Notes (Signed)
Pt provided blankets because now she is cold and shaky, pt noted to be anxious and diaphoretic but significantly more alert then prior to narcan administration.

## 2018-04-10 NOTE — ED Notes (Signed)
Called lab CBC clotted; asked phlebotomy to recollect.

## 2018-04-11 ENCOUNTER — Other Ambulatory Visit: Payer: Self-pay

## 2018-04-11 ENCOUNTER — Inpatient Hospital Stay (HOSPITAL_COMMUNITY): Payer: Medicare Other

## 2018-04-11 ENCOUNTER — Encounter (HOSPITAL_COMMUNITY): Payer: Self-pay | Admitting: Emergency Medicine

## 2018-04-11 LAB — URINE CULTURE
CULTURE: NO GROWTH
Special Requests: NORMAL

## 2018-04-11 LAB — BASIC METABOLIC PANEL
Anion gap: 10 (ref 5–15)
BUN: 13 mg/dL (ref 6–20)
CO2: 21 mmol/L — ABNORMAL LOW (ref 22–32)
Calcium: 8.5 mg/dL — ABNORMAL LOW (ref 8.9–10.3)
Chloride: 104 mmol/L (ref 98–111)
Creatinine, Ser: 0.68 mg/dL (ref 0.44–1.00)
GLUCOSE: 162 mg/dL — AB (ref 70–99)
Potassium: 4.6 mmol/L (ref 3.5–5.1)
Sodium: 135 mmol/L (ref 135–145)

## 2018-04-11 LAB — RESPIRATORY PANEL BY PCR
Adenovirus: NOT DETECTED
Bordetella pertussis: NOT DETECTED
CHLAMYDOPHILA PNEUMONIAE-RVPPCR: NOT DETECTED
Coronavirus 229E: NOT DETECTED
Coronavirus HKU1: NOT DETECTED
Coronavirus NL63: NOT DETECTED
Coronavirus OC43: NOT DETECTED
Influenza A: NOT DETECTED
Influenza B: NOT DETECTED
Metapneumovirus: NOT DETECTED
Mycoplasma pneumoniae: NOT DETECTED
PARAINFLUENZA VIRUS 3-RVPPCR: NOT DETECTED
Parainfluenza Virus 1: NOT DETECTED
Parainfluenza Virus 2: NOT DETECTED
Parainfluenza Virus 4: NOT DETECTED
Respiratory Syncytial Virus: NOT DETECTED
Rhinovirus / Enterovirus: NOT DETECTED

## 2018-04-11 LAB — CBC
HCT: 35.7 % — ABNORMAL LOW (ref 36.0–46.0)
Hemoglobin: 10.6 g/dL — ABNORMAL LOW (ref 12.0–15.0)
MCH: 25.8 pg — AB (ref 26.0–34.0)
MCHC: 29.7 g/dL — ABNORMAL LOW (ref 30.0–36.0)
MCV: 86.9 fL (ref 80.0–100.0)
Platelets: 221 10*3/uL (ref 150–400)
RBC: 4.11 MIL/uL (ref 3.87–5.11)
RDW: 15.2 % (ref 11.5–15.5)
WBC: 12.3 10*3/uL — ABNORMAL HIGH (ref 4.0–10.5)
nRBC: 0 % (ref 0.0–0.2)

## 2018-04-11 LAB — HIV ANTIBODY (ROUTINE TESTING W REFLEX): HIV Screen 4th Generation wRfx: NONREACTIVE

## 2018-04-11 MED ORDER — INFLUENZA VAC SPLIT QUAD 0.5 ML IM SUSY
0.5000 mL | PREFILLED_SYRINGE | INTRAMUSCULAR | Status: AC
  Administered 2018-04-13: 0.5 mL via INTRAMUSCULAR
  Filled 2018-04-11: qty 0.5

## 2018-04-11 MED ORDER — ORAL CARE MOUTH RINSE
15.0000 mL | Freq: Two times a day (BID) | OROMUCOSAL | Status: DC
  Administered 2018-04-12 – 2018-04-16 (×6): 15 mL via OROMUCOSAL

## 2018-04-11 MED ORDER — BENZONATATE 100 MG PO CAPS
200.0000 mg | ORAL_CAPSULE | Freq: Three times a day (TID) | ORAL | Status: DC | PRN
  Administered 2018-04-11 – 2018-04-16 (×9): 200 mg via ORAL
  Filled 2018-04-11 (×9): qty 2

## 2018-04-11 MED ORDER — ONDANSETRON HCL 4 MG/2ML IJ SOLN
4.0000 mg | Freq: Four times a day (QID) | INTRAMUSCULAR | Status: DC | PRN
  Administered 2018-04-11: 4 mg via INTRAVENOUS
  Filled 2018-04-11: qty 2

## 2018-04-11 MED ORDER — PNEUMOCOCCAL VAC POLYVALENT 25 MCG/0.5ML IJ INJ
0.5000 mL | INJECTION | INTRAMUSCULAR | Status: AC
  Administered 2018-04-14: 0.5 mL via INTRAMUSCULAR
  Filled 2018-04-11: qty 0.5

## 2018-04-11 NOTE — ED Notes (Signed)
Pt alert. Titrated O2 down to RA. Pt desat to 88%. Placed pt on 5lnc >90%

## 2018-04-11 NOTE — Progress Notes (Signed)
[facility]                                                                              Patient Demographics  Tammy Hines, is a 58 y.o. female, DOB - 01/18/1961, ZOX:[id number]  Admit date - 04/10/2018   Admitting Physician No admitting provider for patient encounter.  Outpatient Primary MD for the patient is Quitman LivingsHassan, Sami, MD  Outpatient specialists:   LOS - 1  days    Chief Complaint  Patient presents with  . Fall  . Shortness of Breath       Brief summary   Tammy Hines is a 58 y.o. female with medical history significant for but not limited to COPD, Asthma,and fibromyalgia, presenting with 2 day h/o productive cough and fever associated with 2 episodes of "syncope" after taking delsym.    Assessment & Plan    Principal Problem:   Acute respiratory failure with hypoxia (HCC) Active Problems:   Bipolar disorder (HCC)   COPD with acute exacerbation (HCC)   1. Acute resp failure with hypoxia 1. Due to Acute bronchitis  COPD exacerbation  2. CXR neg for PNA 3. Cont COPD pathway 4. Prednisone 5. Rocephin 6. PRN and scheduled nebs 7. Adult wheeze protocol 8. Cont pulse ox and tele monitor 9. Check RVP - influenza is neg 10. Repeat CXR  11.   2. Accidental OD 1. Suspected to be due to taking "half a bottle of delsym" in addition to other CNS depressants.   2. Counselled on proper use of medication dosing 3. Will hold off on further Delsym during admission  Code Status: Full DVT Prophylaxis:  Lovenox  Family Communication: Discussed in detail with the patient, all imaging results, lab results explained to the patient    Disposition Plan: Home  Time Spent in minutes 32 minutes  Procedures:    Consultants:     Antimicrobials:      Medications  Scheduled Meds: . aspirin EC  325 mg Oral Daily  . atenolol  50 mg Oral BID  . buPROPion  150 mg Oral Daily  . clomiPRAMINE  50 mg Oral QHS  . dicyclomine  10 mg Oral TID AC    . enoxaparin (LOVENOX) injection  40 mg Subcutaneous Daily  . FLUoxetine  40 mg Oral Daily  . gabapentin  800 mg Oral QID  . hydrOXYzine  25 mg Oral TID AC & HS  . ipratropium-albuterol  3 mL Nebulization Q6H  . lipase/protease/amylase  36,000 Units Oral TID WC  . mometasone-formoterol  1 puff Inhalation BID  . naproxen  500 mg Oral BID WC  . oxybutynin  15 mg Oral QHS  . pantoprazole  40 mg Oral Daily  . predniSONE  40 mg Oral Q breakfast  . simvastatin  40 mg Oral QHS   Continuous Infusions: . albuterol 15 mg/hr (04/10/18 1702)  . cefTRIAXone (ROCEPHIN)  IV Stopped (04/11/18 0108)   PRN Meds:.albuterol, ALPRAZolam, asenapine, benzonatate, ondansetron (ZOFRAN) IV, oxyCODONE-acetaminophen   Antibiotics   Anti-infectives (From admission, onward)   Start     Dose/Rate Route Frequency Ordered Stop   04/10/18 2330  cefTRIAXone (ROCEPHIN)  1 g in sodium chloride 0.9 % 100 mL IVPB     1 g 200 mL/hr over 30 Minutes Intravenous Daily at bedtime 04/10/18 2303 04/15/18 2159        Subjective:   Tammy Hines was seen and examined today. Patient denies dizziness, chest pain, and shortness of breath is better. No acute events overnight.    Objective:   Vitals:   04/11/18 0915 04/11/18 0930 04/11/18 1000 04/11/18 1015  BP: 115/79 123/68 108/87 110/61  Pulse: 76 80 76 81  Resp: 14     Temp:      TempSrc:      SpO2: 90% 95% 94% 94%  Weight:      Height:        Intake/Output Summary (Last 24 hours) at 04/11/2018 1143 Last data filed at 04/11/2018 0800 Gross per 24 hour  Intake -  Output 800 ml  Net -800 ml     Wt Readings from Last 3 Encounters:  04/10/18 78.5 kg  02/22/18 90.7 kg  03/10/17 99.3 kg     Exam  General: NAD  HEENT: NCAT,  PERRL,MMM  Neck: SUPPLE, (-) JVD  Cardiovascular: RRR, (-) GALLOP, (-) MURMUR  Respiratory: Diffuse wheezes b/l  Gastrointestinal: SOFT, (-) DISTENSION, BS(+), (_) TENDERNESS  Ext: (-) CYANOSIS, (-) EDEMA  Neuro: A, OX  3  Skin:(-) RASH  Psych:NORMAL AFFECT/MOOD   Data Reviewed:  I have personally reviewed following labs and imaging studies  Micro Results Recent Results (from the past 240 hour(s))  Blood Culture (routine x 2)     Status: None (Preliminary result)   Collection Time: 04/10/18  5:38 PM  Result Value Ref Range Status   Specimen Description BLOOD RIGHT HAND  Final   Special Requests   Final    BOTTLES DRAWN AEROBIC AND ANAEROBIC Blood Culture adequate volume   Culture NO GROWTH < 24 HOURS  Final   Report Status PENDING  Incomplete  Blood Culture (routine x 2)     Status: None (Preliminary result)   Collection Time: 04/10/18  5:38 PM  Result Value Ref Range Status   Specimen Description BLOOD RIGHT ANTECUBITAL  Final   Special Requests AEROBIC BOTTLE ONLY Blood Culture adequate volume  Final   Culture NO GROWTH < 24 HOURS  Final   Report Status PENDING  Incomplete  Respiratory Panel by PCR     Status: None   Collection Time: 04/11/18 12:04 AM  Result Value Ref Range Status   Adenovirus NOT DETECTED NOT DETECTED Final   Coronavirus 229E NOT DETECTED NOT DETECTED Final   Coronavirus HKU1 NOT DETECTED NOT DETECTED Final   Coronavirus NL63 NOT DETECTED NOT DETECTED Final   Coronavirus OC43 NOT DETECTED NOT DETECTED Final   Metapneumovirus NOT DETECTED NOT DETECTED Final   Rhinovirus / Enterovirus NOT DETECTED NOT DETECTED Final   Influenza A NOT DETECTED NOT DETECTED Final   Influenza B NOT DETECTED NOT DETECTED Final   Parainfluenza Virus 1 NOT DETECTED NOT DETECTED Final   Parainfluenza Virus 2 NOT DETECTED NOT DETECTED Final   Parainfluenza Virus 3 NOT DETECTED NOT DETECTED Final   Parainfluenza Virus 4 NOT DETECTED NOT DETECTED Final   Respiratory Syncytial Virus NOT DETECTED NOT DETECTED Final   Bordetella pertussis NOT DETECTED NOT DETECTED Final   Chlamydophila pneumoniae NOT DETECTED NOT DETECTED Final   Mycoplasma pneumoniae NOT DETECTED NOT DETECTED Final    Radiology  Reports Ct Head Wo Contrast  Result Date: 04/10/2018 CLINICAL DATA:  Larey SeatFell at  home.  Facial lacerations. EXAM: CT HEAD WITHOUT CONTRAST CT CERVICAL SPINE WITHOUT CONTRAST TECHNIQUE: Multidetector CT imaging of the head and cervical spine was performed following the standard protocol without intravenous contrast. Multiplanar CT image reconstructions of the cervical spine were also generated. COMPARISON:  CT HEAD November 27, 2016 FINDINGS: CT HEAD FINDINGS BRAIN: No intraparenchymal hemorrhage, mass effect nor midline shift. The ventricles and sulci are normal. No acute large vascular territory infarcts. No abnormal extra-axial fluid collections. Basal cisterns are patent. Small parafalcine dural calcifications are unchanged. VASCULAR: Unremarkable. SKULL/SOFT TISSUES: No skull fracture. Small LEFT frontal scalp hematoma without subcutaneous gas or radiopaque foreign bodies. ORBITS/SINUSES: The included ocular globes and orbital contents are normal.Mild paranasal sinus mucosal thickening. Mastoid air cells are well aerated. OTHER: Patient is edentulous. CT CERVICAL SPINE FINDINGS ALIGNMENT: Straightened lordosis.  Grade 1 C4-5 anterolisthesis. SKULL BASE AND VERTEBRAE: Cervical vertebral bodies and posterior elements are intact. Moderate to severe C5-6 and C6-7 disc height loss, mild at C4-5 with proportional endplate spurring. Moderate to severe for cervical facet arthropathy. Calcified longus coli insertion. Moderate C1-2 osteoarthrosis. No destructive bony lesions. SOFT TISSUES AND SPINAL CANAL: Nonacute. 10 mm RIGHT thyroid nodule, below size follow-up recommendation by consensus criteria. DISC LEVELS: Mild C5-6 canal stenosis. Multilevel severe neural foraminal narrowing. UPPER CHEST: Biapical ground-glass opacities. OTHER: None. IMPRESSION: CT HEAD: 1. No acute intracranial process. Small LEFT frontal scalp hematoma. 2. Otherwise negative noncontrast CT HEAD. CT CERVICAL SPINE: 1. No acute fracture. Grade 1 C4-5  anterolisthesis on a degenerative basis. 2. Multilevel severe neural foraminal narrowing. 3. Biapical lung ground-glass opacities seen with pulmonary edema or infection. Electronically Signed   By: Awilda Metro M.D.   On: 04/10/2018 20:32   Ct Cervical Spine Wo Contrast  Result Date: 04/10/2018 CLINICAL DATA:  Larey Seat at home.  Facial lacerations. EXAM: CT HEAD WITHOUT CONTRAST CT CERVICAL SPINE WITHOUT CONTRAST TECHNIQUE: Multidetector CT imaging of the head and cervical spine was performed following the standard protocol without intravenous contrast. Multiplanar CT image reconstructions of the cervical spine were also generated. COMPARISON:  CT HEAD November 27, 2016 FINDINGS: CT HEAD FINDINGS BRAIN: No intraparenchymal hemorrhage, mass effect nor midline shift. The ventricles and sulci are normal. No acute large vascular territory infarcts. No abnormal extra-axial fluid collections. Basal cisterns are patent. Small parafalcine dural calcifications are unchanged. VASCULAR: Unremarkable. SKULL/SOFT TISSUES: No skull fracture. Small LEFT frontal scalp hematoma without subcutaneous gas or radiopaque foreign bodies. ORBITS/SINUSES: The included ocular globes and orbital contents are normal.Mild paranasal sinus mucosal thickening. Mastoid air cells are well aerated. OTHER: Patient is edentulous. CT CERVICAL SPINE FINDINGS ALIGNMENT: Straightened lordosis.  Grade 1 C4-5 anterolisthesis. SKULL BASE AND VERTEBRAE: Cervical vertebral bodies and posterior elements are intact. Moderate to severe C5-6 and C6-7 disc height loss, mild at C4-5 with proportional endplate spurring. Moderate to severe for cervical facet arthropathy. Calcified longus coli insertion. Moderate C1-2 osteoarthrosis. No destructive bony lesions. SOFT TISSUES AND SPINAL CANAL: Nonacute. 10 mm RIGHT thyroid nodule, below size follow-up recommendation by consensus criteria. DISC LEVELS: Mild C5-6 canal stenosis. Multilevel severe neural foraminal  narrowing. UPPER CHEST: Biapical ground-glass opacities. OTHER: None. IMPRESSION: CT HEAD: 1. No acute intracranial process. Small LEFT frontal scalp hematoma. 2. Otherwise negative noncontrast CT HEAD. CT CERVICAL SPINE: 1. No acute fracture. Grade 1 C4-5 anterolisthesis on a degenerative basis. 2. Multilevel severe neural foraminal narrowing. 3. Biapical lung ground-glass opacities seen with pulmonary edema or infection. Electronically Signed   By: Michel Santee.D.  On: 04/10/2018 20:32   Dg Chest Port 1 View  Result Date: 04/11/2018 CLINICAL DATA:  Acute respiratory failure with hypoxia. EXAM: PORTABLE CHEST 1 VIEW COMPARISON:  AP view yesterday. FINDINGS: Lower lung volumes from prior exam. Unchanged cardiomegaly and mediastinal contours. Implanted loop recorder projects over the left chest. Peribronchial thickening. Peripheral opacity in the left mid lung is likely secondary to osseous overlap and known rib fracture. No pleural effusion or pneumothorax. IMPRESSION: Lower lung volumes from prior exam. Peribronchial thickening consistent with bronchitis. Electronically Signed   By: Narda Rutherford M.D.   On: 04/11/2018 00:36   Dg Chest Port 1 View  Result Date: 04/10/2018 CLINICAL DATA:  Cough for 2 days, fell onto buttocks trying to get into car then fell onto face trying to stand up again, history stroke, hypertension, COPD, asthma, smoker EXAM: PORTABLE CHEST 1 VIEW COMPARISON:  Portable exam 1546 hours compared to 08/02/2017 FINDINGS: Loop recorder projects over LEFT heart. Enlargement of cardiac silhouette. Mediastinal contours and pulmonary vascularity normal. Bibasilar atelectasis. Lungs otherwise clear. No pulmonary infiltrate, pleural effusion or pneumothorax. No additional 8 fracture identified at the anterior LEFT fourth rib. IMPRESSION: Enlargement of cardiac silhouette. Bibasilar atelectasis. Fractured anterior LEFT fourth rib. Electronically Signed   By: Ulyses Southward M.D.   On:  04/10/2018 16:08    Lab Data:  CBC: Recent Labs  Lab 04/10/18 2115 04/11/18 0521  WBC 11.4* 12.3*  NEUTROABS 8.3*  --   HGB 11.0* 10.6*  HCT 38.5 35.7*  MCV 88.9 86.9  PLT 214 221   Basic Metabolic Panel: Recent Labs  Lab 04/10/18 1553 04/11/18 0521  NA 136 135  K 4.4 4.6  CL 104 104  CO2 26 21*  GLUCOSE 119* 162*  BUN 16 13  CREATININE 1.09* 0.68  CALCIUM 8.7* 8.5*   GFR: Estimated Creatinine Clearance: 82.8 mL/min (by C-G formula based on SCr of 0.68 mg/dL). Liver Function Tests: Recent Labs  Lab 04/10/18 1553  AST 23  ALT 18  ALKPHOS 79  BILITOT 0.1*  PROT 6.5  ALBUMIN 2.9*   No results for input(s): LIPASE, AMYLASE in the last 168 hours. Recent Labs  Lab 04/10/18 1608  AMMONIA 33   Coagulation Profile: No results for input(s): INR, PROTIME in the last 168 hours. Cardiac Enzymes: No results for input(s): CKTOTAL, CKMB, CKMBINDEX, TROPONINI in the last 168 hours. BNP (last 3 results) No results for input(s): PROBNP in the last 8760 hours. HbA1C: No results for input(s): HGBA1C in the last 72 hours. CBG: No results for input(s): GLUCAP in the last 168 hours. Lipid Profile: No results for input(s): CHOL, HDL, LDLCALC, TRIG, CHOLHDL, LDLDIRECT in the last 72 hours. Thyroid Function Tests: No results for input(s): TSH, T4TOTAL, FREET4, T3FREE, THYROIDAB in the last 72 hours. Anemia Panel: No results for input(s): VITAMINB12, FOLATE, FERRITIN, TIBC, IRON, RETICCTPCT in the last 72 hours. Urine analysis:    Component Value Date/Time   COLORURINE AMBER (A) 04/10/2018 1639   APPEARANCEUR CLOUDY (A) 04/10/2018 1639   LABSPEC 1.024 04/10/2018 1639   PHURINE 5.0 04/10/2018 1639   GLUCOSEU NEGATIVE 04/10/2018 1639   HGBUR NEGATIVE 04/10/2018 1639   BILIRUBINUR NEGATIVE 04/10/2018 1639   KETONESUR NEGATIVE 04/10/2018 1639   PROTEINUR 100 (A) 04/10/2018 1639   NITRITE NEGATIVE 04/10/2018 1639   LEUKOCYTESUR NEGATIVE 04/10/2018 1639     Greggory Stallion  Osei-Bonsu M.D. [facility] 04/11/2018, 11:43 AM  Pager: [phone] Between 7am to 7pm - call Pager - [phone]  After 7pm go to  [email or web address] - password [email or web address]  Call night coverage person covering after 7pm

## 2018-04-11 NOTE — ED Notes (Signed)
BFAST TRAY ORDERED 

## 2018-04-11 NOTE — Evaluation (Signed)
Physical Therapy Evaluation Patient Details Name: Tammy Hines MRN: [id number] DOB: [birth year] Today's Date: 04/11/2018   History of Present Illness  Pt is a 58 y.o. F with significant PMH of COPD, fibromyalgia, bipolar disorder who presents with acute onset of lightheadendess, bronchitis type symptoms, and 2 syncopal events.  Clinical Impression  Prior to admission, patient lives alone, ambulates with Rollator, and reports history of several recent falls. On PT evaluation, patient performing transfers with a walker minimal assistance, deferring ambulation at this time secondary to fatigue. Presents with decreased endurance, balance impairments, difficulty breathing, and stress urinary incontinence. Pt received soaked in urine and bed linens and gown changed. SpO2 96% via Venturi mask on 10L O2. Based on deficits and decreased caregiver support, recommending SNF at this time. Pt acknowledges deficits but states she might want to go home and take care of her cats. Will follow acutely.     Follow Up Recommendations SNF    Equipment Recommendations  None recommended by PT    Recommendations for Other Services       Precautions / Restrictions Precautions Precautions: Fall Restrictions Weight Bearing Restrictions: No      Mobility  Bed Mobility Overal bed mobility: Needs Assistance Bed Mobility: Supine to Sit     Supine to sit: Min assist     General bed mobility comments: Min assist to scoot left hip out to edge of bed  Transfers Overall transfer level: Needs assistance Equipment used: Rolling walker (2 wheeled) Transfers: Sit to/from UGI CorporationStand;Stand Pivot Transfers Sit to Stand: Min guard Stand pivot transfers: Min assist       General transfer comment: MinA for stand pivot transfer from recliner <> bed  Ambulation/Gait                Stairs            Wheelchair Mobility    Modified Rankin (Stroke Patients Only)       Balance Overall balance  assessment: Needs assistance Sitting-balance support: Feet supported Sitting balance-Leahy Scale: Good     Standing balance support: Bilateral upper extremity supported Standing balance-Leahy Scale: Poor                               Pertinent Vitals/Pain Pain Assessment: No/denies pain    Home Living Family/patient expects to be discharged to:: Private residence Living Arrangements: Alone Available Help at Discharge: Friend(s);Available PRN/intermittently Type of Home: Mobile home Home Access: Ramped entrance     Home Layout: One level Home Equipment: Walker - 4 wheels      Prior Function Level of Independence: Independent with assistive device(s)         Comments: Ambulates with Rollator, takes 1 bath per week, reports she only drives "if she has to."     Hand Dominance        Extremity/Trunk Assessment   Upper Extremity Assessment Upper Extremity Assessment: Overall WFL for tasks assessed    Lower Extremity Assessment Lower Extremity Assessment: Generalized weakness    Cervical / Trunk Assessment Cervical / Trunk Assessment: Other exceptions Cervical / Trunk Exceptions: increased body habitus  Communication   Communication: No difficulties  Cognition Arousal/Alertness: Awake/alert Behavior During Therapy: WFL for tasks assessed/performed Overall Cognitive Status: Within Functional Limits for tasks assessed  General Comments      Exercises     Assessment/Plan    PT Assessment Patient needs continued PT services  PT Problem List Decreased strength;Decreased activity tolerance;Decreased balance;Decreased mobility;Cardiopulmonary status limiting activity       PT Treatment Interventions DME instruction;Gait training;Functional mobility training;Therapeutic activities;Therapeutic exercise;Balance training;Patient/family education    PT Goals (Current goals can be found in the Care  Plan section)  Acute Rehab PT Goals Patient Stated Goal: "take care of my cats." PT Goal Formulation: With patient Time For Goal Achievement: 04/25/18 Potential to Achieve Goals: Good    Frequency Min 3X/week   Barriers to discharge Decreased caregiver support      Co-evaluation               AM-PAC PT "6 Clicks" Mobility  Outcome Measure Help needed turning from your back to your side while in a flat bed without using bedrails?: None Help needed moving from lying on your back to sitting on the side of a flat bed without using bedrails?: A Little Help needed moving to and from a bed to a chair (including a wheelchair)?: A Little Help needed standing up from a chair using your arms (e.g., wheelchair or bedside chair)?: A Little Help needed to walk in hospital room?: A Little Help needed climbing 3-5 steps with a railing? : A Lot 6 Click Score: 18    End of Session Equipment Utilized During Treatment: Gait belt;Oxygen Activity Tolerance: Patient limited by fatigue Patient left: in bed;with call bell/phone within reach;with family/visitor present Nurse Communication: Mobility status;Other (comment)(faulty purewick) PT Visit Diagnosis: Unsteadiness on feet (R26.81);History of falling (Z91.81);Difficulty in walking, not elsewhere classified (R26.2)    Time: [id number] PT Time Calculation (min) (ACUTE ONLY): 29 min   Charges:   PT Evaluation $PT Eval Moderate Complexity: 1 Mod PT Treatments $Therapeutic Activity: 8-22 mins      Tammy Hines, PT, DPT Acute Rehabilitation Services Pager [phone] Office [phone]   Tammy Hines 04/11/2018, 5:16 PM

## 2018-04-11 NOTE — ED Notes (Signed)
Patient continues to take venturi-mask off because "it irritates her nose". O2 sats drop in the 80s when off of the mask but when she leaves the mask on they will come back up to 92%.   MD Julian Reil made aware.

## 2018-04-11 NOTE — ED Notes (Signed)
Tray ordered for pt.

## 2018-04-11 NOTE — ED Notes (Signed)
Lab unable to process CBC due to inadequate amount. Re-ordered and will try and recollect.

## 2018-04-12 DIAGNOSIS — J9601 Acute respiratory failure with hypoxia: Secondary | ICD-10-CM

## 2018-04-12 DIAGNOSIS — J441 Chronic obstructive pulmonary disease with (acute) exacerbation: Principal | ICD-10-CM

## 2018-04-12 DIAGNOSIS — F3170 Bipolar disorder, currently in remission, most recent episode unspecified: Secondary | ICD-10-CM

## 2018-04-12 MED ORDER — HYDROXYZINE HCL 25 MG PO TABS: 25.0000 mg | ORAL_TABLET | Freq: Three times a day (TID) | ORAL | Status: DC | PRN

## 2018-04-12 MED ORDER — METHYLPREDNISOLONE SODIUM SUCC 125 MG IJ SOLR
60.0000 mg | Freq: Four times a day (QID) | INTRAMUSCULAR | Status: DC
  Administered 2018-04-13 – 2018-04-15 (×10): 60 mg via INTRAVENOUS
  Filled 2018-04-12 (×10): qty 2

## 2018-04-12 MED ORDER — GABAPENTIN 300 MG PO CAPS
600.0000 mg | ORAL_CAPSULE | Freq: Four times a day (QID) | ORAL | Status: DC
  Administered 2018-04-12 – 2018-04-16 (×15): 600 mg via ORAL
  Filled 2018-04-12 (×15): qty 2

## 2018-04-12 MED ORDER — NAPROXEN 250 MG PO TABS: 500.0000 mg | ORAL_TABLET | Freq: Two times a day (BID) | ORAL | Status: DC | PRN

## 2018-04-12 MED ORDER — GUAIFENESIN ER 600 MG PO TB12
600.0000 mg | ORAL_TABLET | Freq: Once | ORAL | Status: AC
  Administered 2018-04-12: 600 mg via ORAL
  Filled 2018-04-12: qty 1

## 2018-04-12 MED ORDER — METHYLPREDNISOLONE SODIUM SUCC 125 MG IJ SOLR
125.0000 mg | Freq: Once | INTRAMUSCULAR | Status: AC
  Administered 2018-04-12: 125 mg via INTRAVENOUS
  Filled 2018-04-12: qty 2

## 2018-04-12 NOTE — Progress Notes (Signed)
Nutrition Brief Note  Patient identified by physician consult.  Wt Readings from Last 15 Encounters:  04/10/18 78.5 kg  02/22/18 90.7 kg  03/10/17 99.3 kg  11/28/16 122.5 kg  11/28/16 122.5 kg  11/27/16 122.5 kg  11/06/16 126.1 kg  06/27/16 134.9 kg  12/31/15 (!) 137 kg  10/02/15 133.8 kg  08/30/15 132 kg  11/19/14 130.3 kg    Body mass index is 27.1 kg/m. Patient meets criteria for overweight based on current BMI.   Current diet order is Heart Healthy, patient is consuming approximately 100% of meals at this time. Labs and medications reviewed. Pt takes creon TID with meals.  Noted 27# weight loss in chart - per pt, this weight loss is intentional. Describes making healthier food choices and monitoring portion sizes. No nutrition interventions warranted at this time. If nutrition issues arise, please consult RD.   Jolaine Artist, MS, RDN, LDN Pager: [phone] Available Mondays and Fridays, 9am-2pm

## 2018-04-12 NOTE — Progress Notes (Signed)
PROGRESS NOTE  Tammy Hines  ZOX:[id number] DOB: 05/14/1960 DOA: 04/10/2018 PCP: Quitman LivingsHassan, Sami, MD   Brief Narrative: Tammy Hines a 58 y.o.femalewith medical history significant for but not limited toCOPD, Asthma,and fibromyalgia, presenting with 2 day h/o productive cough and fever associated with 2 episodes of "syncope" after taking delsym.  Assessment & Plan: Principal Problem:   Acute respiratory failure with hypoxia (HCC) Active Problems:   Bipolar disorder (HCC)   COPD with acute exacerbation (HCC)  Acute hypoxic respiratory failure due to COPD exacerbation: Worsening - Continue supplemental oxygen, up to 8L today - Continue q6h duoneb and albuterol q2h prn.  - Augment steroids, increase back to IV steroids - Continue ceftriaxone - Continue dulera - Consider ABG for ?hypercarbia vs. hypoventilation due to sedation.  HTN:  - Continue atenolol  Chronic pancreatitis: No pain - Continue creon  OAB:  - Continue oxybutynin  GERD:  - Continue PPI  Hyperlipidemia:  - Continue statin  Sedation:  - Decrease sedating medications as below. If worsens, will need ABG.   Unintentional overdose of antitussive: Some CNS depression following overuse of delsym.  - Counseled patient on improper use of medications, prescription and OTC. - Continue home medications, which include many sedating medications. Will decrease gabapentin 800mg  QID > 600mg  QID. Will change hydroxyzine TID to prn.   Bipolar disorder, insomnia:  - Continue wellbutrin, clomipramine, xanax, SSRI, gabapentin, atarax, asenapine.   Overweight: BMI 27. - Weight loss recommended  DVT prophylaxis: Lovenox Code Status: Full Family Communication: None at bedside Disposition Plan: Uncertain. Guarded prognosis.  Consultants:   None  Procedures:   None  Antimicrobials:  Ceftriaxone   Subjective: Shortness of breath is stable, no better from yesterday. Very weak, not wanting to get up with  therapy due to weakness and dyspnea with minimal exertion. No chest pain.   Objective: Vitals:   04/12/18 0754 04/12/18 0827 04/12/18 1428 04/12/18 1813  BP:  113/87  133/82  Pulse: 80 86 85 73  Resp: 18 20 20 20   Temp:  98.1 F (36.7 C)  98.3 F (36.8 C)  TempSrc:  Oral  Oral  SpO2: 96% 91% 93% 97%  Weight:      Height:        Intake/Output Summary (Last 24 hours) at 04/12/2018 1856 Last data filed at 04/12/2018 1000 Gross per 24 hour  Intake 440 ml  Output 800 ml  Net -360 ml   Filed Weights   04/10/18 1521  Weight: 78.5 kg    Gen: 58 y.o. female in no distress  Pulm: Non-labored tachypnea. Coarse wheezing bilaterally CV: Regular rate and rhythm. No murmur, rub, or gallop. No JVD, no pedal edema. GI: Abdomen soft, non-tender, non-distended, with normoactive bowel sounds. No organomegaly or masses felt. Ext: Warm, no deformities Skin: No rashes, lesions or ulcers Neuro: Drowsy but rousable and conversant, oriented. No focal neurological deficits. Psych: Judgement and insight appear normal. Mood & affect appropriate.   Data Reviewed: I have personally reviewed following labs and imaging studies  CBC: Recent Labs  Lab 04/10/18 2115 04/11/18 0521  WBC 11.4* 12.3*  NEUTROABS 8.3*  --   HGB 11.0* 10.6*  HCT 38.5 35.7*  MCV 88.9 86.9  PLT 214 221   Basic Metabolic Panel: Recent Labs  Lab 04/10/18 1553 04/11/18 0521  NA 136 135  K 4.4 4.6  CL 104 104  CO2 26 21*  GLUCOSE 119* 162*  BUN 16 13  CREATININE 1.09* 0.68  CALCIUM 8.7* 8.5*  GFR: Estimated Creatinine Clearance: 82.8 mL/min (by C-G formula based on SCr of 0.68 mg/dL). Liver Function Tests: Recent Labs  Lab 04/10/18 1553  AST 23  ALT 18  ALKPHOS 79  BILITOT 0.1*  PROT 6.5  ALBUMIN 2.9*   No results for input(s): LIPASE, AMYLASE in the last 168 hours. Recent Labs  Lab 04/10/18 1608  AMMONIA 33   Coagulation Profile: No results for input(s): INR, PROTIME in the last 168 hours. Cardiac  Enzymes: No results for input(s): CKTOTAL, CKMB, CKMBINDEX, TROPONINI in the last 168 hours. BNP (last 3 results) No results for input(s): PROBNP in the last 8760 hours. HbA1C: No results for input(s): HGBA1C in the last 72 hours. CBG: No results for input(s): GLUCAP in the last 168 hours. Lipid Profile: No results for input(s): CHOL, HDL, LDLCALC, TRIG, CHOLHDL, LDLDIRECT in the last 72 hours. Thyroid Function Tests: No results for input(s): TSH, T4TOTAL, FREET4, T3FREE, THYROIDAB in the last 72 hours. Anemia Panel: No results for input(s): VITAMINB12, FOLATE, FERRITIN, TIBC, IRON, RETICCTPCT in the last 72 hours. Urine analysis:    Component Value Date/Time   COLORURINE AMBER (A) 04/10/2018 1639   APPEARANCEUR CLOUDY (A) 04/10/2018 1639   LABSPEC 1.024 04/10/2018 1639   PHURINE 5.0 04/10/2018 1639   GLUCOSEU NEGATIVE 04/10/2018 1639   HGBUR NEGATIVE 04/10/2018 1639   BILIRUBINUR NEGATIVE 04/10/2018 1639   KETONESUR NEGATIVE 04/10/2018 1639   PROTEINUR 100 (A) 04/10/2018 1639   NITRITE NEGATIVE 04/10/2018 1639   LEUKOCYTESUR NEGATIVE 04/10/2018 1639   Recent Results (from the past 240 hour(s))  Urine culture     Status: None   Collection Time: 04/10/18  4:39 PM  Result Value Ref Range Status   Specimen Description URINE, CLEAN CATCH  Final   Special Requests Normal  Final   Culture   Final    NO GROWTH Performed at [facility], [address], [place], [place] [address]    Report Status 04/11/2018 FINAL  Final  Blood Culture (routine x 2)     Status: None (Preliminary result)   Collection Time: 04/10/18  5:38 PM  Result Value Ref Range Status   Specimen Description BLOOD RIGHT HAND  Final   Special Requests   Final    BOTTLES DRAWN AEROBIC AND ANAEROBIC Blood Culture adequate volume   Culture NO GROWTH 2 DAYS  Final   Report Status PENDING  Incomplete  Blood Culture (routine x 2)     Status: None (Preliminary result)   Collection Time: 04/10/18  5:38 PM    Result Value Ref Range Status   Specimen Description BLOOD RIGHT ANTECUBITAL  Final   Special Requests AEROBIC BOTTLE ONLY Blood Culture adequate volume  Final   Culture NO GROWTH 2 DAYS  Final   Report Status PENDING  Incomplete  Respiratory Panel by PCR     Status: None   Collection Time: 04/11/18 12:04 AM  Result Value Ref Range Status   Adenovirus NOT DETECTED NOT DETECTED Final   Coronavirus 229E NOT DETECTED NOT DETECTED Final   Coronavirus HKU1 NOT DETECTED NOT DETECTED Final   Coronavirus NL63 NOT DETECTED NOT DETECTED Final   Coronavirus OC43 NOT DETECTED NOT DETECTED Final   Metapneumovirus NOT DETECTED NOT DETECTED Final   Rhinovirus / Enterovirus NOT DETECTED NOT DETECTED Final   Influenza A NOT DETECTED NOT DETECTED Final   Influenza B NOT DETECTED NOT DETECTED Final   Parainfluenza Virus 1 NOT DETECTED NOT DETECTED Final   Parainfluenza Virus 2 NOT  DETECTED NOT DETECTED Final   Parainfluenza Virus 3 NOT DETECTED NOT DETECTED Final   Parainfluenza Virus 4 NOT DETECTED NOT DETECTED Final   Respiratory Syncytial Virus NOT DETECTED NOT DETECTED Final   Bordetella pertussis NOT DETECTED NOT DETECTED Final   Chlamydophila pneumoniae NOT DETECTED NOT DETECTED Final   Mycoplasma pneumoniae NOT DETECTED NOT DETECTED Final      Radiology Studies: Ct Head Wo Contrast  Result Date: 04/10/2018 CLINICAL DATA:  Larey Seat at home.  Facial lacerations. EXAM: CT HEAD WITHOUT CONTRAST CT CERVICAL SPINE WITHOUT CONTRAST TECHNIQUE: Multidetector CT imaging of the head and cervical spine was performed following the standard protocol without intravenous contrast. Multiplanar CT image reconstructions of the cervical spine were also generated. COMPARISON:  CT HEAD November 27, 2016 FINDINGS: CT HEAD FINDINGS BRAIN: No intraparenchymal hemorrhage, mass effect nor midline shift. The ventricles and sulci are normal. No acute large vascular territory infarcts. No abnormal extra-axial fluid collections.  Basal cisterns are patent. Small parafalcine dural calcifications are unchanged. VASCULAR: Unremarkable. SKULL/SOFT TISSUES: No skull fracture. Small LEFT frontal scalp hematoma without subcutaneous gas or radiopaque foreign bodies. ORBITS/SINUSES: The included ocular globes and orbital contents are normal.Mild paranasal sinus mucosal thickening. Mastoid air cells are well aerated. OTHER: Patient is edentulous. CT CERVICAL SPINE FINDINGS ALIGNMENT: Straightened lordosis.  Grade 1 C4-5 anterolisthesis. SKULL BASE AND VERTEBRAE: Cervical vertebral bodies and posterior elements are intact. Moderate to severe C5-6 and C6-7 disc height loss, mild at C4-5 with proportional endplate spurring. Moderate to severe for cervical facet arthropathy. Calcified longus coli insertion. Moderate C1-2 osteoarthrosis. No destructive bony lesions. SOFT TISSUES AND SPINAL CANAL: Nonacute. 10 mm RIGHT thyroid nodule, below size follow-up recommendation by consensus criteria. DISC LEVELS: Mild C5-6 canal stenosis. Multilevel severe neural foraminal narrowing. UPPER CHEST: Biapical ground-glass opacities. OTHER: None. IMPRESSION: CT HEAD: 1. No acute intracranial process. Small LEFT frontal scalp hematoma. 2. Otherwise negative noncontrast CT HEAD. CT CERVICAL SPINE: 1. No acute fracture. Grade 1 C4-5 anterolisthesis on a degenerative basis. 2. Multilevel severe neural foraminal narrowing. 3. Biapical lung ground-glass opacities seen with pulmonary edema or infection. Electronically Signed   By: Awilda Metro M.D.   On: 04/10/2018 20:32   Ct Cervical Spine Wo Contrast  Result Date: 04/10/2018 CLINICAL DATA:  Larey Seat at home.  Facial lacerations. EXAM: CT HEAD WITHOUT CONTRAST CT CERVICAL SPINE WITHOUT CONTRAST TECHNIQUE: Multidetector CT imaging of the head and cervical spine was performed following the standard protocol without intravenous contrast. Multiplanar CT image reconstructions of the cervical spine were also generated.  COMPARISON:  CT HEAD November 27, 2016 FINDINGS: CT HEAD FINDINGS BRAIN: No intraparenchymal hemorrhage, mass effect nor midline shift. The ventricles and sulci are normal. No acute large vascular territory infarcts. No abnormal extra-axial fluid collections. Basal cisterns are patent. Small parafalcine dural calcifications are unchanged. VASCULAR: Unremarkable. SKULL/SOFT TISSUES: No skull fracture. Small LEFT frontal scalp hematoma without subcutaneous gas or radiopaque foreign bodies. ORBITS/SINUSES: The included ocular globes and orbital contents are normal.Mild paranasal sinus mucosal thickening. Mastoid air cells are well aerated. OTHER: Patient is edentulous. CT CERVICAL SPINE FINDINGS ALIGNMENT: Straightened lordosis.  Grade 1 C4-5 anterolisthesis. SKULL BASE AND VERTEBRAE: Cervical vertebral bodies and posterior elements are intact. Moderate to severe C5-6 and C6-7 disc height loss, mild at C4-5 with proportional endplate spurring. Moderate to severe for cervical facet arthropathy. Calcified longus coli insertion. Moderate C1-2 osteoarthrosis. No destructive bony lesions. SOFT TISSUES AND SPINAL CANAL: Nonacute. 10 mm RIGHT thyroid nodule, below size follow-up recommendation by  consensus criteria. DISC LEVELS: Mild C5-6 canal stenosis. Multilevel severe neural foraminal narrowing. UPPER CHEST: Biapical ground-glass opacities. OTHER: None. IMPRESSION: CT HEAD: 1. No acute intracranial process. Small LEFT frontal scalp hematoma. 2. Otherwise negative noncontrast CT HEAD. CT CERVICAL SPINE: 1. No acute fracture. Grade 1 C4-5 anterolisthesis on a degenerative basis. 2. Multilevel severe neural foraminal narrowing. 3. Biapical lung ground-glass opacities seen with pulmonary edema or infection. Electronically Signed   By: Awilda Metroourtnay  Bloomer M.D.   On: 04/10/2018 20:32   Dg Chest Port 1 View  Result Date: 04/11/2018 CLINICAL DATA:  Acute respiratory failure with hypoxia. EXAM: PORTABLE CHEST 1 VIEW COMPARISON:  AP  view yesterday. FINDINGS: Lower lung volumes from prior exam. Unchanged cardiomegaly and mediastinal contours. Implanted loop recorder projects over the left chest. Peribronchial thickening. Peripheral opacity in the left mid lung is likely secondary to osseous overlap and known rib fracture. No pleural effusion or pneumothorax. IMPRESSION: Lower lung volumes from prior exam. Peribronchial thickening consistent with bronchitis. Electronically Signed   By: Narda RutherfordMelanie  Sanford M.D.   On: 04/11/2018 00:36    Scheduled Meds: . aspirin EC  325 mg Oral Daily  . atenolol  50 mg Oral BID  . buPROPion  150 mg Oral Daily  . clomiPRAMINE  50 mg Oral QHS  . dicyclomine  10 mg Oral TID AC  . enoxaparin (LOVENOX) injection  40 mg Subcutaneous Daily  . FLUoxetine  40 mg Oral Daily  . gabapentin  800 mg Oral QID  . hydrOXYzine  25 mg Oral TID AC & HS  . Influenza vac split quadrivalent PF  0.5 mL Intramuscular Tomorrow-1000  . ipratropium-albuterol  3 mL Nebulization Q6H  . lipase/protease/amylase  36,000 Units Oral TID WC  . mouth rinse  15 mL Mouth Rinse BID  . mometasone-formoterol  1 puff Inhalation BID  . naproxen  500 mg Oral BID WC  . oxybutynin  15 mg Oral QHS  . pantoprazole  40 mg Oral Daily  . pneumococcal 23 valent vaccine  0.5 mL Intramuscular Tomorrow-1000  . predniSONE  40 mg Oral Q breakfast  . simvastatin  40 mg Oral QHS   Continuous Infusions: . albuterol 15 mg/hr (04/10/18 1702)  . cefTRIAXone (ROCEPHIN)  IV Stopped (04/12/18 0801)     LOS: 2 days   Time spent: 35 minutes.  Tyrone Nineyan B Simrah Chatham, MD [facility] [email or web address] Password [email or web address] 04/12/2018, 6:56 PM

## 2018-04-12 NOTE — NC FL2 (Signed)
[place] MEDICAID FL2 LEVEL OF CARE SCREENING TOOL     IDENTIFICATION  Patient Name: Tammy Hines Birthdate: [birth year] Sex: female Admission Date (Current Location): 04/10/2018  [facility] and [place] Number:  Producer, television/film/video and Address:  [facility], [address] [place], [place] [address]      Provider Number: [id number]  Attending Physician Name and Address:  Tyrone Nine, MD  Relative Name and Phone Number:       Current Level of Care: Hospital Recommended Level of Care: Skilled Nursing Facility Prior Approval Number:    Date Approved/Denied:   PASRR Number: pending  Discharge Plan: SNF    Current Diagnoses: Patient Active Problem List   Diagnosis Date Noted  . Acute respiratory failure with hypoxia (HCC) 04/10/2018  . COPD with acute exacerbation (HCC) 04/10/2018  . Hammer toes of both feet 02/28/2018  . MDD (major depressive disorder), recurrent severe, without psychosis (HCC) 03/11/2017  . Bipolar 1 disorder, depressed, severe (HCC) 03/11/2017  . Acute encephalopathy 11/28/2016  . Stroke (cerebrum) (HCC)   . Acute ischemic stroke (HCC) 06/24/2016  . Bipolar disorder (HCC) 06/24/2016  . Hypertension 06/24/2016  . Hyperlipidemia 06/24/2016  . Anxiety 06/24/2016  . Facial droop   . GERD without esophagitis   . SORE THROAT 12/04/2008  . VIRAL URI 03/04/2008  . BLURRED VISION 10/22/2007  . FATIGUE 10/22/2007  . DISC DISEASE, LUMBAR 09/06/2007  . COUGH 04/20/2007  . Depression 11/19/2006  . Asthma 11/19/2006  . GERD 11/19/2006  . HEADACHE 11/19/2006  . HEPATITIS B, HX OF 11/19/2006    Orientation RESPIRATION BLADDER Height & Weight     Self, Time, Situation, Place  Other (Comment)(Venturi Mask, 8L) External catheter, Incontinent(placed 04/10/2018) Weight: 173 lb (78.5 kg) Height:  5\' 7"  (170.2 cm)  BEHAVIORAL SYMPTOMS/MOOD NEUROLOGICAL BOWEL NUTRITION STATUS      Continent Diet(heart healthy/thin liquids)   AMBULATORY STATUS COMMUNICATION OF NEEDS Skin   Limited Assist Verbally Normal                       Personal Care Assistance Level of Assistance  Dressing, Feeding, Bathing Bathing Assistance: Limited assistance Feeding assistance: Independent Dressing Assistance: Limited assistance     Functional Limitations Info  Sight, Hearing, Speech Sight Info: Adequate Hearing Info: Adequate Speech Info: Adequate    SPECIAL CARE FACTORS FREQUENCY  PT (By licensed PT), OT (By licensed OT)     PT Frequency: 2x OT Frequency: 2x            Contractures Contractures Info: Not present    Additional Factors Info  Allergies, Code Status Code Status Info: Full Code Allergies Info: Ampicillin, Cleocin Clindamycin Hcl, Clonazepam, Other, Penicillins           Current Medications (04/12/2018):  This is the current hospital active medication list Current Facility-Administered Medications  Medication Dose Route Frequency Provider Last Rate Last Dose  . albuterol (PROVENTIL) (2.5 MG/3ML) 0.083% nebulizer solution 2.5 mg  2.5 mg Nebulization Q2H PRN Hillary Bow, DO      . albuterol (PROVENTIL,VENTOLIN) solution continuous neb  15 mg/hr Nebulization Continuous Long, Arlyss Repress, MD 3 mL/hr at 04/10/18 1702 15 mg/hr at 04/10/18 1702  . ALPRAZolam Prudy Feeler) tablet 1-2 mg  1-2 mg Oral TID PRN Hillary Bow, DO      . asenapine (SAPHRIS) sublingual tablet 10 mg  10 mg Sublingual QHS PRN,MR X 1 Hillary Bow, DO   10  mg at 04/12/18 0038  . aspirin EC tablet 325 mg  325 mg Oral Daily Hillary BowGardner, Jared M, DO   325 mg at 04/12/18 [id number]  . atenolol (TENORMIN) tablet 50 mg  50 mg Oral BID Lyda PeroneGardner, Jared M, DO   50 mg at 04/12/18 0820  . benzonatate (TESSALON) capsule 200 mg  200 mg Oral TID PRN Hillary BowGardner, Jared M, DO   200 mg at 04/12/18 0819  . buPROPion (WELLBUTRIN XL) 24 hr tablet 150 mg  150 mg Oral Daily Lyda PeroneGardner, Jared M, DO   150 mg at 04/12/18 0820  . cefTRIAXone (ROCEPHIN) 1 g in sodium  chloride 0.9 % 100 mL IVPB  1 g Intravenous QHS Hillary BowGardner, Jared M, DO   Stopped at 04/12/18 0801  . clomiPRAMINE (ANAFRANIL) capsule 50 mg  50 mg Oral QHS Lyda PeroneGardner, Jared M, DO   50 mg at 04/11/18 2327  . dicyclomine (BENTYL) capsule 10 mg  10 mg Oral TID Leeann MustAC Gardner, Jared M, DO   10 mg at 04/12/18 0825  . enoxaparin (LOVENOX) injection 40 mg  40 mg Subcutaneous Daily Lyda PeroneGardner, Jared M, DO   40 mg at 04/12/18 0825  . FLUoxetine (PROZAC) capsule 40 mg  40 mg Oral Daily Hillary BowGardner, Jared M, DO   40 mg at 04/12/18 [id number]  . gabapentin (NEURONTIN) capsule 800 mg  800 mg Oral QID Lyda PeroneGardner, Jared M, DO   800 mg at 04/12/18 0820  . hydrOXYzine (ATARAX/VISTARIL) tablet 25 mg  25 mg Oral TID AC & HS Hillary BowGardner, Jared M, DO   25 mg at 04/12/18 0820  . Influenza vac split quadrivalent PF (FLUARIX) injection 0.5 mL  0.5 mL Intramuscular Tomorrow-1000 Osei-Bonsu, George, MD      . ipratropium-albuterol (DUONEB) 0.5-2.5 (3) MG/3ML nebulizer solution 3 mL  3 mL Nebulization Q6H Lyda PeroneGardner, Jared M, DO   3 mL at 04/12/18 0753  . lipase/protease/amylase (CREON) capsule 36,000 Units  36,000 Units Oral TID WC Hillary BowGardner, Jared M, DO   36,000 Units at 04/12/18 0825  . MEDLINE mouth rinse  15 mL Mouth Rinse BID Osei-Bonsu, Greggory StallionGeorge, MD   15 mL at 04/12/18 0825  . mometasone-formoterol (DULERA) 200-5 MCG/ACT inhaler 1 puff  1 puff Inhalation BID Hillary BowGardner, Jared M, DO   1 puff at 04/12/18 0754  . naproxen (NAPROSYN) tablet 500 mg  500 mg Oral BID WC Lyda PeroneGardner, Jared M, DO   500 mg at 04/12/18 0820  . ondansetron (ZOFRAN) injection 4 mg  4 mg Intravenous Q6H PRN Hillary BowGardner, Jared M, DO   4 mg at 04/11/18 0108  . oxybutynin (DITROPAN XL) 24 hr tablet 15 mg  15 mg Oral QHS Lyda PeroneGardner, Jared M, DO   15 mg at 04/11/18 2327  . oxyCODONE-acetaminophen (PERCOCET/ROXICET) 5-325 MG per tablet 1-2 tablet  1-2 tablet Oral TID PRN Hillary BowGardner, Jared M, DO      . pantoprazole (PROTONIX) EC tablet 40 mg  40 mg Oral Daily Lyda PeroneGardner, Jared M, DO   40 mg at 04/12/18 [id number]  .  pneumococcal 23 valent vaccine (PNU-IMMUNE) injection 0.5 mL  0.5 mL Intramuscular Tomorrow-1000 Osei-Bonsu, George, MD      . predniSONE (DELTASONE) tablet 40 mg  40 mg Oral Q breakfast Lyda PeroneGardner, Jared M, DO   40 mg at 04/12/18 0820  . simvastatin (ZOCOR) tablet 40 mg  40 mg Oral QHS Lyda PeroneGardner, Jared M, DO   40 mg at 04/11/18 2327     Discharge Medications: Please see discharge summary for a list of discharge medications.  Relevant Imaging Results:  Relevant Lab Results:   Additional Information SSN: [id number]  Maree Krabbe, LCSW

## 2018-04-12 NOTE — Care Management Note (Signed)
Case Management Note  Patient Details  Name: Tammy JarvisGretchen K Hines MRN: [id number] Date of Birth: 1961-02-08  Subjective/Objective:   COPD exac                 Action/Plan:  NCM spoke to pt and offered choice for HH/CMS list provided/placed on chart. Pt requested [facility], states she had them in the past[facility]. States she has RW and shower chair at home.  Will need oxygen and neb machine for home. Will need qualify oxygen sat. And HH RN, PT and aide orders with [facility].   Expected Discharge Date:  04/15/18               Expected Discharge Plan:  Home w Home Health Services  In-House Referral:  Clinical Social Work  Discharge planning Services  CM Consult  Post Acute Care Choice:  Home Health Choice offered to:  Patient  DME Arranged:    DME Agency:     HH Arranged:  RN, PT, OT, Nurse's Aide HH Agency:  [facility]  Status of Service:  In process, will continue to follow  If discussed at Long Length of Stay Meetings, dates discussed:    Additional Comments:  Elliot CousinShavis, Rika Daughdrill Ellen, RN 04/12/2018, 1:03 PM

## 2018-04-12 NOTE — Evaluation (Signed)
Occupational Therapy Evaluation Patient Details Name: Tammy JarvisGretchen K Bednarczyk MRN: [id number] DOB: 1960-04-27 Today's Date: 04/12/2018    History of Present Illness Pt is a 58 y.o. F with significant PMH of COPD, fibromyalgia, bipolar disorder who presents with acute onset of lightheadendess, bronchitis type symptoms, and 2 syncopal events.   Clinical Impression   Patient presenting with decreased I in self care, balance, functional transfers/mobility, strength, endurance, and safety awareness. Patient reports being mod I with rollator PTA. Patient currently functioning at min - mod A overall. Fatigues very quickly with self care tasks. OT provided pt with green resistive theraband for B UE strengthening exercises. Pt performed 10 reps of bicep curls, shoulder diagonals, shoulder flexion, chest pulls and alternating punches rest breaks as needed. Pt has intermittent assist from friends and neighbors and would benefit from continued short term rehab stay for safety.  Patient will benefit from acute OT to increase overall independence in the areas of ADLs, functional mobility, safety in order to safely discharge yo next venue of care.    Follow Up Recommendations  SNF    Equipment Recommendations  Other (comment)(defer to next to venue of care)    Recommendations for Other Services       Precautions / Restrictions Precautions Precautions: Fall Restrictions Weight Bearing Restrictions: No      Mobility Bed Mobility Overal bed mobility: Needs Assistance Bed Mobility: Supine to Sit     Supine to sit: Min guard     General bed mobility comments: increased time with min cuing for proper technique  Transfers Overall transfer level: Needs assistance Equipment used: Rolling walker (2 wheeled) Transfers: Sit to/from UGI CorporationStand;Stand Pivot Transfers Sit to Stand: Min guard Stand pivot transfers: Min assist            Balance Overall balance assessment: Needs assistance Sitting-balance  support: Feet supported Sitting balance-Leahy Scale: Good     Standing balance support: Bilateral upper extremity supported Standing balance-Leahy Scale: Poor Standing balance comment: rely on RW        ADL either performed or assessed with clinical judgement   ADL Overall ADL's : Needs assistance/impaired Eating/Feeding: Modified independent   Grooming: Set up;Wash/dry hands;Wash/dry face;Oral care   Upper Body Bathing: Set up;Sitting   Lower Body Bathing: Minimal assistance;Sit to/from stand   Upper Body Dressing : Set up   Lower Body Dressing: Minimal assistance;Sit to/from stand   Toilet Transfer: Minimal assistance;RW;BSC   Toileting- ArchitectClothing Manipulation and Hygiene: Sit to/from stand;Minimal assistance               Vision Baseline Vision/History: Wears glasses Wears Glasses: Reading only Patient Visual Report: No change from baseline              Pertinent Vitals/Pain Pain Assessment: No/denies pain     Hand Dominance Right   Extremity/Trunk Assessment Upper Extremity Assessment Upper Extremity Assessment: Generalized weakness   Lower Extremity Assessment Lower Extremity Assessment: Generalized weakness       Communication Communication Communication: No difficulties   Cognition Arousal/Alertness: Awake/alert Behavior During Therapy: WFL for tasks assessed/performed Overall Cognitive Status: Within Functional Limits for tasks assessed                                                Home Living Family/patient expects to be discharged to:: Private residence Living Arrangements: Alone Available Help at Discharge:  Friend(s);Available PRN/intermittently Type of Home: Mobile home Home Access: Ramped entrance     Home Layout: One level     Bathroom Shower/Tub: Chief Strategy Officer: Standard     Home Equipment: Walker - 4 wheels          Prior Functioning/Environment Level of Independence:  Independent with assistive device(s)        Comments: Ambulates with Rollator, takes 1 bath per week, reports she only drives "if she has to."        OT Problem List: Decreased strength;Impaired balance (sitting and/or standing);Pain;Decreased safety awareness;Decreased activity tolerance      OT Treatment/Interventions: Self-care/ADL training;Therapeutic exercise;Patient/family education;Neuromuscular education;Balance training;Energy conservation;Therapeutic activities;DME and/or AE instruction    OT Goals(Current goals can be found in the care plan section) Acute Rehab OT Goals Patient Stated Goal: to get up and feel better OT Goal Formulation: With patient Time For Goal Achievement: 04/26/18 Potential to Achieve Goals: Good ADL Goals Pt Will Perform Lower Body Bathing: with supervision Pt Will Perform Lower Body Dressing: with supervision Pt Will Transfer to Toilet: with supervision Pt Will Perform Toileting - Clothing Manipulation and hygiene: with supervision Pt Will Perform Tub/Shower Transfer: with supervision  OT Frequency: Min 2X/week   Barriers to D/C: Other (comment)  decreased caregiver support       Co-evaluation              AM-PAC OT "6 Clicks" Daily Activity     Outcome Measure Help from another person eating meals?: None Help from another person taking care of personal grooming?: A Little Help from another person toileting, which includes using toliet, bedpan, or urinal?: A Little Help from another person bathing (including washing, rinsing, drying)?: A Little Help from another person to put on and taking off regular upper body clothing?: A Little Help from another person to put on and taking off regular lower body clothing?: A Little 6 Click Score: 19   End of Session Equipment Utilized During Treatment: Oxygen Nurse Communication: Mobility status  Activity Tolerance: Patient tolerated treatment well Patient left: in chair;with nursing/sitter in  room  OT Visit Diagnosis: Unsteadiness on feet (R26.81);Repeated falls (R29.6);History of falling (Z91.81);Muscle weakness (generalized) (M62.81)                Time: 2353-6144 OT Time Calculation (min): 23 min Charges:  OT General Charges $OT Visit: 1 Visit OT Evaluation $OT Eval Low Complexity: 1 Low OT Treatments $Therapeutic Exercise: 23-37 mins    Alen Bleacher 04/12/2018, 12:26 PM

## 2018-04-13 ENCOUNTER — Encounter (HOSPITAL_COMMUNITY): Payer: Self-pay | Admitting: Family Medicine

## 2018-04-13 LAB — BASIC METABOLIC PANEL
Anion gap: 5 (ref 5–15)
BUN: 18 mg/dL (ref 6–20)
CALCIUM: 8.9 mg/dL (ref 8.9–10.3)
CHLORIDE: 104 mmol/L (ref 98–111)
CO2: 29 mmol/L (ref 22–32)
Creatinine, Ser: 0.67 mg/dL (ref 0.44–1.00)
GFR calc Af Amer: >60 mL/min (ref >60)
GFR calc non Af Amer: >60 mL/min (ref >60)
Glucose, Bld: 158 mg/dL — ABNORMAL HIGH (ref 70–99)
Potassium: 4.8 mmol/L (ref 3.5–5.1)
Sodium: 138 mmol/L (ref 135–145)

## 2018-04-13 LAB — CBC
HCT: 33.6 % — ABNORMAL LOW (ref 36.0–46.0)
HEMOGLOBIN: 10.1 g/dL — AB (ref 12.0–15.0)
MCH: 25.4 pg — ABNORMAL LOW (ref 26.0–34.0)
MCHC: 30.1 g/dL (ref 30.0–36.0)
MCV: 84.4 fL (ref 80.0–100.0)
Platelets: 253 10*3/uL (ref 150–400)
RBC: 3.98 MIL/uL (ref 3.87–5.11)
RDW: 15.1 % (ref 11.5–15.5)
WBC: 11.2 10*3/uL — ABNORMAL HIGH (ref 4.0–10.5)
nRBC: 0 % (ref 0.0–0.2)

## 2018-04-13 MED ORDER — OLOPATADINE HCL 0.1 % OP SOLN
1.0000 [drp] | Freq: Two times a day (BID) | OPHTHALMIC | Status: DC
  Administered 2018-04-14 – 2018-04-15 (×2): 1 [drp] via OPHTHALMIC
  Filled 2018-04-13: qty 5

## 2018-04-13 MED ORDER — POLYVINYL ALCOHOL 1.4 % OP SOLN
1.0000 [drp] | OPHTHALMIC | Status: DC | PRN
  Administered 2018-04-13 – 2018-04-14 (×2): 1 [drp] via OPHTHALMIC
  Filled 2018-04-13: qty 15

## 2018-04-13 NOTE — Progress Notes (Signed)
Physical Therapy Treatment Patient Details Name: Tammy Hines MRN: [id number] DOB: Sep 30, 1960 Today's Date: 04/13/2018    History of Present Illness Pt is a 58 y.o. F with significant PMH of COPD, fibromyalgia, bipolar disorder who presents with acute onset of lightheadendess, bronchitis type symptoms, and 2 syncopal events.    PT Comments    Pt finishing breakfast upon entry, agreeable and motivated to participate, AMB this date. Pt recently moved to [place] at 4L/min per RN, received at 91%, moved to 5L/min for AMB c terminal SpO2 91%. It is not clear that pt is benefiting significantly from this as she is a mouth breather at baseline, reports she is unable to perform nasal inhalation during AMB. Pt AMB 2x21ft with 2 minutes trest between. AMB very slow, but without any frank gait instability. Pt left up in chair with call bell/tray in place, pt made comfortable.    Follow Up Recommendations  SNF     Equipment Recommendations  None recommended by PT    Recommendations for Other Services       Precautions / Restrictions Precautions Precautions: Fall Restrictions Weight Bearing Restrictions: No    Mobility  Bed Mobility Overal bed mobility: Modified Independent Bed Mobility: Supine to Sit           General bed mobility comments: additional time, but appears safe and steady  Transfers Overall transfer level: Needs assistance Equipment used: Rolling walker (2 wheeled) Transfers: Sit to/from Stand Sit to Stand: Supervision         General transfer comment: moving slow, but good safety awareness.   Ambulation/Gait Ambulation/Gait assistance: Supervision Gait Distance (Feet): 24 Feet Assistive device: Rolling walker (2 wheeled)       General Gait Details: very slow, small movements, audible crepitus from Left hip (per patient); no blantant instability   Stairs             Wheelchair Mobility    Modified Rankin (Stroke Patients Only)       Balance  Overall balance assessment: Mild deficits observed, not formally tested;Modified Independent   Sitting balance-Leahy Scale: Normal       Standing balance-Leahy Scale: Good                              Cognition Arousal/Alertness: Awake/alert Behavior During Therapy: WFL for tasks assessed/performed Overall Cognitive Status: Within Functional Limits for tasks assessed                                        Exercises      General Comments        Pertinent Vitals/Pain Pain Assessment: No/denies pain    Home Living                      Prior Function            PT Goals (current goals can now be found in the care plan section) Acute Rehab PT Goals Patient Stated Goal: to get up and feel better PT Goal Formulation: With patient Time For Goal Achievement: 04/25/18 Potential to Achieve Goals: Good Progress towards PT goals: Progressing toward goals    Frequency    Min 3X/week      PT Plan Current plan remains appropriate    Co-evaluation  AM-PAC PT "6 Clicks" Mobility   Outcome Measure  Help needed turning from your back to your side while in a flat bed without using bedrails?: A Little Help needed moving from lying on your back to sitting on the side of a flat bed without using bedrails?: A Little Help needed moving to and from a bed to a chair (including a wheelchair)?: A Little Help needed standing up from a chair using your arms (e.g., wheelchair or bedside chair)?: A Little Help needed to walk in hospital room?: A Little Help needed climbing 3-5 steps with a railing? : A Lot 6 Click Score: 17    End of Session Equipment Utilized During Treatment: Gait belt;Oxygen Activity Tolerance: Patient limited by fatigue;Patient tolerated treatment well Patient left: with call bell/phone within reach;in chair Nurse Communication: Mobility status PT Visit Diagnosis: Unsteadiness on feet (R26.81);History of  falling (Z91.81);Difficulty in walking, not elsewhere classified (R26.2)     Time: [id number] PT Time Calculation (min) (ACUTE ONLY): 24 min  Charges:  $Therapeutic Activity: 23-37 mins                     9:59 AM, 04/13/18 Rosamaria LintsAllan C Buccola, PT, DPT Physical Therapist - [place] [phone] (Pager)  [phone] (Office)      Buccola,Allan C 04/13/2018, 9:57 AM

## 2018-04-13 NOTE — Progress Notes (Addendum)
PROGRESS NOTE  Tammy Hines  NFA:[id number] DOB: 1960-09-15 DOA: 04/10/2018 PCP: Tammy LivingsHassan, Sami, MD   Brief Narrative: Tammy KrillGretchen K Hines a 58 y.o.femalewith medical history significant for but not limited toCOPD, Asthma,and fibromyalgia, presenting with 2 day h/o productive cough and fever associated with 2 episodes of "syncope" after taking delsym.  Assessment & Plan: Principal Problem:   Acute respiratory failure with hypoxia (HCC) Active Problems:   Bipolar disorder (HCC)   Hypertension   Hyperlipidemia   GERD without esophagitis   Bipolar 1 disorder, depressed, severe (HCC)   COPD with acute exacerbation (HCC)  Acute hypoxic respiratory failure due to COPD exacerbation: Has improved with augmentation of IV steroids. Has been on venturi mask due to being a mouth breather, down to [place] today. BNP 81, RVP and influenza negative. Has history of COPD and asthma on problem list, has been long term tobacco user. - Continue supplemental oxygen prn. - Continue q6h duoneb and albuterol q2h prn.  - Plan to continue IV steroids as is for 24 hours, then can deescalate based on exam. - Continue ceftriaxone - Continue dulera - Smoking cessation counseling  Unintentional overdose of antitussive: Some CNS depression following overuse of delsym.  - Counseled patient on improper use of medications, prescription and OTC. - Continue home medications, which include many sedating medications. Decreased gabapentin 800mg  QID > 600mg  QID and changed hydroxyzine TID to prn.  HTN:  - Continue atenolol  Chronic pancreatitis: No pain - Continue creon  OAB:  - Continue oxybutynin  GERD:  - Continue PPI  Hyperlipidemia:  - Continue statin  Sedation: Resolved.  Bipolar disorder, insomnia:  - Continue wellbutrin, clomipramine, xanax, SSRI, gabapentin, atarax, asenapine.   Overweight: BMI 27. - Weight loss recommended  DVT prophylaxis: Lovenox Code Status: Full Family Communication:  None at bedside Disposition Plan: If improvement continues (still no where near baseline), may be stable for discharge to SNF by 1/9-1/10.   Consultants:   None  Procedures:   None  Antimicrobials:  Ceftriaxone 1/4 >>   Subjective: States she slept well last night because her breathing has improved in the past 24 hours. She still has a hacking cough that is persistent, severe, associated with dyspnea worse on exertion and improved with breathing treatments.   Objective: Vitals:   04/12/18 2300 04/13/18 0225 04/13/18 0753 04/13/18 0940  BP: 119/72  (!) 158/99   Pulse: 71  70 85  Resp: 20  20   Temp: 98.4 F (36.9 C)  98.2 F (36.8 C)   TempSrc: Oral  Oral   SpO2: 95% 96%  91%  Weight:      Height:        Intake/Output Summary (Last 24 hours) at 04/13/2018 1412 Last data filed at 04/13/2018 0953 Gross per 24 hour  Intake 360 ml  Output 1700 ml  Net -1340 ml   Filed Weights   04/10/18 1521  Weight: 78.5 kg   Gen: 58 y.o. female in no distress Pulm: Nonlabored with 5LPM supplemental oxygen. Hacking cough with deep breaths, coarse wheezing toward the latter half of expiration diffusely. CV: Regular rate and rhythm. No murmur, rub, or gallop. No JVD, no dependent edema. GI: Abdomen soft, non-tender, non-distended, with normoactive bowel sounds.  Ext: Warm, no deformities Skin: No rashes, lesions or ulcers on visualized skin.  Neuro: More alert this morning and oriented. No focal neurological deficits. Psych: Judgement and insight appear fair. Mood euthymic & affect congruent. Behavior is appropriate.    Data Reviewed: I  have personally reviewed following labs and imaging studies  CBC: Recent Labs  Lab 04/10/18 2115 04/11/18 0521 04/13/18 0407  WBC 11.4* 12.3* 11.2*  NEUTROABS 8.3*  --   --   HGB 11.0* 10.6* 10.1*  HCT 38.5 35.7* 33.6*  MCV 88.9 86.9 84.4  PLT 214 221 253   Basic Metabolic Panel: Recent Labs  Lab 04/10/18 1553 04/11/18 0521 04/13/18 0407    NA 136 135 138  K 4.4 4.6 4.8  CL 104 104 104  CO2 26 21* 29  GLUCOSE 119* 162* 158*  BUN 16 13 18   CREATININE 1.09* 0.68 0.67  CALCIUM 8.7* 8.5* 8.9   GFR: Estimated Creatinine Clearance: 82.8 mL/min (by C-G formula based on SCr of 0.67 mg/dL). Liver Function Tests: Recent Labs  Lab 04/10/18 1553  AST 23  ALT 18  ALKPHOS 79  BILITOT 0.1*  PROT 6.5  ALBUMIN 2.9*   Recent Labs  Lab 04/10/18 1608  AMMONIA 33   Urine analysis:    Component Value Date/Time   COLORURINE AMBER (A) 04/10/2018 1639   APPEARANCEUR CLOUDY (A) 04/10/2018 1639   LABSPEC 1.024 04/10/2018 1639   PHURINE 5.0 04/10/2018 1639   GLUCOSEU NEGATIVE 04/10/2018 1639   HGBUR NEGATIVE 04/10/2018 1639   BILIRUBINUR NEGATIVE 04/10/2018 1639   KETONESUR NEGATIVE 04/10/2018 1639   PROTEINUR 100 (A) 04/10/2018 1639   NITRITE NEGATIVE 04/10/2018 1639   LEUKOCYTESUR NEGATIVE 04/10/2018 1639   Recent Results (from the past 240 hour(s))  Urine culture     Status: None   Collection Time: 04/10/18  4:39 PM  Result Value Ref Range Status   Specimen Description URINE, CLEAN CATCH  Final   Special Requests Normal  Final   Culture   Final    NO GROWTH Performed at [facility], [address] [place], [place] [address]    Report Status 04/11/2018 FINAL  Final  Blood Culture (routine x 2)     Status: None (Preliminary result)   Collection Time: 04/10/18  5:38 PM  Result Value Ref Range Status   Specimen Description BLOOD RIGHT HAND  Final   Special Requests   Final    BOTTLES DRAWN AEROBIC AND ANAEROBIC Blood Culture adequate volume   Culture   Final    NO GROWTH 3 DAYS Performed at [facility], [address], [place], [place] [address]    Report Status PENDING  Incomplete  Blood Culture (routine x 2)     Status: None (Preliminary result)   Collection Time: 04/10/18  5:38 PM  Result Value Ref Range Status   Specimen Description BLOOD RIGHT ANTECUBITAL  Final   Special Requests AEROBIC  BOTTLE ONLY Blood Culture adequate volume  Final   Culture   Final    NO GROWTH 3 DAYS Performed at [facility], [address], [place], [place] [address]    Report Status PENDING  Incomplete  Respiratory Panel by PCR     Status: None   Collection Time: 04/11/18 12:04 AM  Result Value Ref Range Status   Adenovirus NOT DETECTED NOT DETECTED Final   Coronavirus 229E NOT DETECTED NOT DETECTED Final   Coronavirus HKU1 NOT DETECTED NOT DETECTED Final   Coronavirus NL63 NOT DETECTED NOT DETECTED Final   Coronavirus OC43 NOT DETECTED NOT DETECTED Final   Metapneumovirus NOT DETECTED NOT DETECTED Final   Rhinovirus / Enterovirus NOT DETECTED NOT DETECTED Final   Influenza A NOT DETECTED NOT DETECTED Final   Influenza B  NOT DETECTED NOT DETECTED Final   Parainfluenza Virus 1 NOT DETECTED NOT DETECTED Final   Parainfluenza Virus 2 NOT DETECTED NOT DETECTED Final   Parainfluenza Virus 3 NOT DETECTED NOT DETECTED Final   Parainfluenza Virus 4 NOT DETECTED NOT DETECTED Final   Respiratory Syncytial Virus NOT DETECTED NOT DETECTED Final   Bordetella pertussis NOT DETECTED NOT DETECTED Final   Chlamydophila pneumoniae NOT DETECTED NOT DETECTED Final   Mycoplasma pneumoniae NOT DETECTED NOT DETECTED Final      Radiology Studies: No results found.  Scheduled Meds: . aspirin EC  325 mg Oral Daily  . atenolol  50 mg Oral BID  . buPROPion  150 mg Oral Daily  . clomiPRAMINE  50 mg Oral QHS  . dicyclomine  10 mg Oral TID AC  . enoxaparin (LOVENOX) injection  40 mg Subcutaneous Daily  . FLUoxetine  40 mg Oral Daily  . gabapentin  600 mg Oral QID  . ipratropium-albuterol  3 mL Nebulization Q6H  . lipase/protease/amylase  36,000 Units Oral TID WC  . mouth rinse  15 mL Mouth Rinse BID  . methylPREDNISolone (SOLU-MEDROL) injection  60 mg Intravenous Q6H  . mometasone-formoterol  1 puff Inhalation BID  . oxybutynin  15 mg Oral QHS  . pantoprazole  40 mg Oral Daily  . pneumococcal 23 valent  vaccine  0.5 mL Intramuscular Tomorrow-1000  . simvastatin  40 mg Oral QHS   Continuous Infusions: . cefTRIAXone (ROCEPHIN)  IV 1 g (04/12/18 2210)     LOS: 3 days   Time spent: 35 minutes.  Tyrone Nineyan B Naamah Boggess, MD [facility] [email or web address] Password [email or web address] 04/13/2018, 2:12 PM

## 2018-04-14 DIAGNOSIS — I10 Essential (primary) hypertension: Secondary | ICD-10-CM

## 2018-04-14 DIAGNOSIS — F314 Bipolar disorder, current episode depressed, severe, without psychotic features: Secondary | ICD-10-CM

## 2018-04-14 DIAGNOSIS — K219 Gastro-esophageal reflux disease without esophagitis: Secondary | ICD-10-CM

## 2018-04-14 DIAGNOSIS — E7849 Other hyperlipidemia: Secondary | ICD-10-CM

## 2018-04-14 MED ORDER — MENTHOL 3 MG MT LOZG
1.0000 | LOZENGE | OROMUCOSAL | Status: DC | PRN
  Filled 2018-04-14 (×2): qty 9

## 2018-04-14 NOTE — Progress Notes (Signed)
Occupational Therapy Treatment Patient Details Name: Tammy Hines MRN: [id number] DOB: 11-Nov-1960 Today's Date: 04/14/2018    History of present illness Pt is a 58 y.o. F with significant PMH of COPD, fibromyalgia, bipolar disorder who presents with acute onset of lightheadendess, bronchitis type symptoms, and 2 syncopal events.   OT comments  Progressing towards acute OT goals. Focus of session was bed mobility, simulated toilet transfer (EOB>recliner), and energy conservation strategies for ADLs. Pt reporting chest discomfort due to "all the coughing I've been doing." D/c plan remains appropriate.    Follow Up Recommendations  SNF    Equipment Recommendations  Other (comment)(defer to next to venue of care)    Recommendations for Other Services      Precautions / Restrictions Precautions Precautions: Fall Restrictions Weight Bearing Restrictions: No       Mobility Bed Mobility Overal bed mobility: Modified Independent Bed Mobility: Supine to Sit     Supine to sit: Min guard     General bed mobility comments: additional time, but appears safe and steady  Transfers Overall transfer level: Needs assistance Equipment used: Rolling walker (2 wheeled) Transfers: Sit to/from Stand Sit to Stand: Min guard Stand pivot transfers: Min guard;Min assist       General transfer comment: some steadying assist during pivoting    Balance Overall balance assessment: Mild deficits observed, not formally tested;Modified Independent Sitting-balance support: Feet supported Sitting balance-Leahy Scale: Normal     Standing balance support: Bilateral upper extremity supported Standing balance-Leahy Scale: Good Standing balance comment: rely on RW                           ADL either performed or assessed with clinical judgement   ADL Overall ADL's : Needs assistance/impaired                         Toilet Transfer: Minimal assistance;RW;BSC Toilet  Transfer Details (indicate cue type and reason): simulated with pivotal stepsEOB to recliner         Functional mobility during ADLs: Minimal assistance;Rolling walker General ADL Comments: Pt completed bed mobility, and pivotal steps to recliner. Declined further mobility in the room, plans to walk with nursing later this morning. Pt mentioned difficulty with LB ADLs due to ongoing hip issues. Discussed AE and strategies.     Vision       Perception     Praxis      Cognition Arousal/Alertness: Awake/alert Behavior During Therapy: WFL for tasks assessed/performed Overall Cognitive Status: Within Functional Limits for tasks assessed                                          Exercises     Shoulder Instructions       General Comments On 2L O2 throughout session. O2 sats in chair at end of session was 95, pulse 67    Pertinent Vitals/ Pain       Pain Assessment: Faces Faces Pain Scale: Hurts little more Pain Location: chest "from all the coughing" Pain Descriptors / Indicators: Constant Pain Intervention(s): Monitored during session;Other (comment);Limited activity within patient's tolerance(spoke with RN and NT about heat pack)  Home Living  Prior Functioning/Environment              Frequency  Min 2X/week        Progress Toward Goals  OT Goals(current goals can now be found in the care plan section)  Progress towards OT goals: Progressing toward goals  Acute Rehab OT Goals Patient Stated Goal: to get up and feel better OT Goal Formulation: With patient Time For Goal Achievement: 04/26/18 Potential to Achieve Goals: Good ADL Goals Pt Will Perform Lower Body Bathing: with supervision Pt Will Perform Lower Body Dressing: with supervision Pt Will Transfer to Toilet: with supervision Pt Will Perform Toileting - Clothing Manipulation and hygiene: with supervision Pt Will Perform  Tub/Shower Transfer: with supervision  Plan Discharge plan remains appropriate    Co-evaluation                 AM-PAC OT "6 Clicks" Daily Activity     Outcome Measure   Help from another person eating meals?: None Help from another person taking care of personal grooming?: A Little Help from another person toileting, which includes using toliet, bedpan, or urinal?: A Little Help from another person bathing (including washing, rinsing, drying)?: A Little Help from another person to put on and taking off regular upper body clothing?: A Little Help from another person to put on and taking off regular lower body clothing?: A Little 6 Click Score: 19    End of Session Equipment Utilized During Treatment: Oxygen;Rolling walker  OT Visit Diagnosis: Unsteadiness on feet (R26.81);Repeated falls (R29.6);History of falling (Z91.81);Muscle weakness (generalized) (M62.81)   Activity Tolerance Patient tolerated treatment well   Patient Left in chair;with call bell/phone within reach   Nurse Communication Other (comment)(heat pack for chest soreness)        Time: 1287-8676 OT Time Calculation (min): 32 min  Charges: OT General Charges $OT Visit: 1 Visit OT Treatments $Self Care/Home Management : 23-37 mins  Raynald Kemp, OT Acute Rehabilitation Services Pager: [phone] Office: [phone]    Pilar Grammes 04/14/2018, 12:31 PM

## 2018-04-14 NOTE — Progress Notes (Signed)
PROGRESS NOTE  Tammy Hines  LZJ:[id number] DOB: 1960-11-04 DOA: 04/10/2018 PCP: Quitman Livings, MD   Brief Narrative: Tammy Hines a 58 y.o.femalewith medical history significant for but not limited toCOPD, Asthma,and fibromyalgia, presenting with 2 day h/o productive cough and fever associated with 2 episodes of "syncope" after taking delsym.  Assessment & Plan: Principal Problem:   Acute respiratory failure with hypoxia (HCC) Active Problems:   Bipolar disorder (HCC)   Hypertension   Hyperlipidemia   GERD without esophagitis   Bipolar 1 disorder, depressed, severe (HCC)   COPD with acute exacerbation (HCC)  Acute hypoxic respiratory failure due to COPD exacerbation: Has improved with augmentation of IV steroids. Has been on venturi mask due to being a mouth breather, down to [place] today. BNP 81, RVP and influenza negative. Has history of COPD and asthma on problem list, has been long term tobacco user. - Continue supplemental oxygen to maintain SpO2 >90%. Will need ambulatory pulse oximetry closer to discharge.  - Continue q6h duoneb and albuterol q2h prn.  - Continue IV steroids with ongoing wheezing. - Continue ceftriaxone - Continue dulera - Smoking cessation counseling  Unintentional overdose of antitussive: Some CNS depression following overuse of delsym.  - Counseled patient on improper use of medications, prescription and OTC. - Continue home medications, which include many sedating medications. Decreased gabapentin 800mg  QID > 600mg  QID and changed hydroxyzine TID to prn. - Lozenges prn cough  HTN:  - Continue atenolol  Chronic pancreatitis: No pain - Continue creon  OAB:  - Continue oxybutynin  GERD:  - Continue PPI  Hyperlipidemia:  - Continue statin  Sedation: Resolved. Polypharmacy was discussed with patient and encouraged to address dose reduction and decreased pill burden with PCP.  Bipolar disorder, insomnia:  - Continue wellbutrin,  clomipramine, xanax, SSRI, gabapentin, atarax, asenapine.   Overweight: BMI 27. - Weight loss recommended  DVT prophylaxis: Lovenox Code Status: Full Family Communication: None at bedside Disposition Plan: Will need SNF at discharge to return to prior level of functioning. Respiratory status still markedly abnormal, new oxygen requirement, and abnormal pulmonary exam. Hopeful for improvement to the point of stability in the next 48 hours.   Consultants:   None  Procedures:   None  Antimicrobials:  Ceftriaxone 1/4 >>   Subjective: Not good, but better. Walked with OT to the nurses desk requiring several stops, severe dyspnea which is not baseline. Can usually walk that distance unassisted. Chest pain resolving, cough is severe.   Objective: Vitals:   04/13/18 2015 04/13/18 2018 04/14/18 0003 04/14/18 0730  BP:   126/72 138/76  Pulse:   66 62  Resp:   19 14  Temp:   97.6 F (36.4 C)   TempSrc:      SpO2: 95% 95% 92% 95%  Weight:      Height:        Intake/Output Summary (Last 24 hours) at 04/14/2018 1641 Last data filed at 04/14/2018 0806 Gross per 24 hour  Intake 590 ml  Output 1650 ml  Net -1060 ml   Filed Weights   04/10/18 1521  Weight: 78.5 kg   Gen: 58 y.o. female in no distress Pulm: Nonlabored at rest on supplemental oxygen. Bilateral end-expiratory wheezing with prolongation of expiratory phase. CV: Regular rate and rhythm. No murmur, rub, or gallop. No JVD, no dependent edema. GI: Abdomen soft, non-tender, non-distended, with normoactive bowel sounds.  Ext: Warm, no deformities Skin: No rashes, lesions or ulcers on visualized skin. Neuro: Alert and  oriented, conversant, normal mental status. No focal neurological deficits. Psych: Judgement and insight appear fair. Mood euthymic & affect congruent. Behavior is appropriate.    Data Reviewed: I have personally reviewed following labs and imaging studies  CBC: Recent Labs  Lab 04/10/18 2115  04/11/18 0521 04/13/18 0407  WBC 11.4* 12.3* 11.2*  NEUTROABS 8.3*  --   --   HGB 11.0* 10.6* 10.1*  HCT 38.5 35.7* 33.6*  MCV 88.9 86.9 84.4  PLT 214 221 253   Basic Metabolic Panel: Recent Labs  Lab 04/10/18 1553 04/11/18 0521 04/13/18 0407  NA 136 135 138  K 4.4 4.6 4.8  CL 104 104 104  CO2 26 21* 29  GLUCOSE 119* 162* 158*  BUN 16 13 18   CREATININE 1.09* 0.68 0.67  CALCIUM 8.7* 8.5* 8.9   GFR: Estimated Creatinine Clearance: 82.8 mL/min (by C-G formula based on SCr of 0.67 mg/dL). Liver Function Tests: Recent Labs  Lab 04/10/18 1553  AST 23  ALT 18  ALKPHOS 79  BILITOT 0.1*  PROT 6.5  ALBUMIN 2.9*   Recent Labs  Lab 04/10/18 1608  AMMONIA 33   Urine analysis:    Component Value Date/Time   COLORURINE AMBER (A) 04/10/2018 1639   APPEARANCEUR CLOUDY (A) 04/10/2018 1639   LABSPEC 1.024 04/10/2018 1639   PHURINE 5.0 04/10/2018 1639   GLUCOSEU NEGATIVE 04/10/2018 1639   HGBUR NEGATIVE 04/10/2018 1639   BILIRUBINUR NEGATIVE 04/10/2018 1639   KETONESUR NEGATIVE 04/10/2018 1639   PROTEINUR 100 (A) 04/10/2018 1639   NITRITE NEGATIVE 04/10/2018 1639   LEUKOCYTESUR NEGATIVE 04/10/2018 1639   Recent Results (from the past 240 hour(s))  Urine culture     Status: None   Collection Time: 04/10/18  4:39 PM  Result Value Ref Range Status   Specimen Description URINE, CLEAN CATCH  Final   Special Requests Normal  Final   Culture   Final    NO GROWTH Performed at [facility], [address] [place], [place] [address]    Report Status 04/11/2018 FINAL  Final  Blood Culture (routine x 2)     Status: None (Preliminary result)   Collection Time: 04/10/18  5:38 PM  Result Value Ref Range Status   Specimen Description BLOOD RIGHT HAND  Final   Special Requests   Final    BOTTLES DRAWN AEROBIC AND ANAEROBIC Blood Culture adequate volume   Culture   Final    NO GROWTH 4 DAYS Performed at [facility], [address] [place], [place] [address]     Report Status PENDING  Incomplete  Blood Culture (routine x 2)     Status: None (Preliminary result)   Collection Time: 04/10/18  5:38 PM  Result Value Ref Range Status   Specimen Description BLOOD RIGHT ANTECUBITAL  Final   Special Requests AEROBIC BOTTLE ONLY Blood Culture adequate volume  Final   Culture   Final    NO GROWTH 4 DAYS Performed at [facility], [address] [place], [place] [address]    Report Status PENDING  Incomplete  Respiratory Panel by PCR     Status: None   Collection Time: 04/11/18 12:04 AM  Result Value Ref Range Status   Adenovirus NOT DETECTED NOT DETECTED Final   Coronavirus 229E NOT DETECTED NOT DETECTED Final   Coronavirus HKU1 NOT DETECTED NOT DETECTED Final   Coronavirus NL63 NOT DETECTED NOT DETECTED Final   Coronavirus OC43 NOT DETECTED NOT DETECTED Final   Metapneumovirus  NOT DETECTED NOT DETECTED Final   Rhinovirus / Enterovirus NOT DETECTED NOT DETECTED Final   Influenza A NOT DETECTED NOT DETECTED Final   Influenza B NOT DETECTED NOT DETECTED Final   Parainfluenza Virus 1 NOT DETECTED NOT DETECTED Final   Parainfluenza Virus 2 NOT DETECTED NOT DETECTED Final   Parainfluenza Virus 3 NOT DETECTED NOT DETECTED Final   Parainfluenza Virus 4 NOT DETECTED NOT DETECTED Final   Respiratory Syncytial Virus NOT DETECTED NOT DETECTED Final   Bordetella pertussis NOT DETECTED NOT DETECTED Final   Chlamydophila pneumoniae NOT DETECTED NOT DETECTED Final   Mycoplasma pneumoniae NOT DETECTED NOT DETECTED Final      Radiology Studies: No results found.  Scheduled Meds: . aspirin EC  325 mg Oral Daily  . atenolol  50 mg Oral BID  . buPROPion  150 mg Oral Daily  . clomiPRAMINE  50 mg Oral QHS  . dicyclomine  10 mg Oral TID AC  . enoxaparin (LOVENOX) injection  40 mg Subcutaneous Daily  . FLUoxetine  40 mg Oral Daily  . gabapentin  600 mg Oral QID  . ipratropium-albuterol  3 mL Nebulization Q6H  . lipase/protease/amylase  36,000 Units Oral  TID WC  . mouth rinse  15 mL Mouth Rinse BID  . methylPREDNISolone (SOLU-MEDROL) injection  60 mg Intravenous Q6H  . mometasone-formoterol  1 puff Inhalation BID  . olopatadine  1 drop Both Eyes BID  . oxybutynin  15 mg Oral QHS  . pantoprazole  40 mg Oral Daily  . simvastatin  40 mg Oral QHS   Continuous Infusions: . cefTRIAXone (ROCEPHIN)  IV 1 g (04/13/18 2101)     LOS: 4 days   Time spent: 25 minutes.  Tyrone Nine, MD [facility] [email or web address] Password [facility] 04/14/2018, 4:41 PM

## 2018-04-15 LAB — CULTURE, BLOOD (ROUTINE X 2)
CULTURE: NO GROWTH
Culture: NO GROWTH
SPECIAL REQUESTS: ADEQUATE
Special Requests: ADEQUATE

## 2018-04-15 MED ORDER — IPRATROPIUM-ALBUTEROL 0.5-2.5 (3) MG/3ML IN SOLN
3.0000 mL | Freq: Three times a day (TID) | RESPIRATORY_TRACT | Status: DC
  Administered 2018-04-16: 3 mL via RESPIRATORY_TRACT
  Filled 2018-04-15: qty 3

## 2018-04-15 MED ORDER — PREDNISONE 20 MG PO TABS
40.0000 mg | ORAL_TABLET | Freq: Every day | ORAL | Status: DC
  Administered 2018-04-16: 40 mg via ORAL
  Filled 2018-04-15: qty 2

## 2018-04-15 NOTE — Progress Notes (Signed)
Physical Therapy Treatment Patient Details Name: Tammy Hines MRN: [id number] DOB: 1960-12-24 Today's Date: 04/15/2018    History of Present Illness Pt is a 58 y.o. F with significant PMH of COPD, fibromyalgia, bipolar disorder who presents with acute onset of lightheadendess, bronchitis type symptoms, and 2 syncopal events.    PT Comments    Patient demonstrating good progress with therapy today, increasing level of independence with transfers and and gait. Emphasis on hand placement during transfers,  Walking short distances on RA, no overt LOB with use of RW. Feel with cont progress pt can return home with HHPT to increase activity tolerance. Pt states desire to be home over SNF.     Follow Up Recommendations  HHPT PT     Equipment Recommendations  None recommended by PT    Recommendations for Other Services       Precautions / Restrictions Precautions Precautions: Fall Restrictions Weight Bearing Restrictions: No    Mobility  Bed Mobility Overal bed mobility: Modified Independent             General bed mobility comments: increased time and effort  Transfers Overall transfer level: Needs assistance Equipment used: Rolling walker (2 wheeled) Transfers: Sit to/from Stand Sit to Stand: Min guard            Ambulation/Gait Ambulation/Gait assistance: Supervision Gait Distance (Feet): 40 Feet Assistive device: Rolling walker (2 wheeled) Gait Pattern/deviations: Step-to pattern Gait velocity: decreased   General Gait Details: no LOB, SpO2 88-92% on RA. increased coughing by end of session. comfortable in chair end of gait.    Stairs             Wheelchair Mobility    Modified Rankin (Stroke Patients Only)       Balance Overall balance assessment: Mild deficits observed, not formally tested;Modified Independent Sitting-balance support: Feet supported Sitting balance-Leahy Scale: Normal     Standing balance support: Bilateral upper  extremity supported Standing balance-Leahy Scale: Good Standing balance comment: rely on RW                            Cognition Arousal/Alertness: Awake/alert Behavior During Therapy: WFL for tasks assessed/performed Overall Cognitive Status: Within Functional Limits for tasks assessed                                        Exercises      General Comments        Pertinent Vitals/Pain Pain Assessment: Faces Faces Pain Scale: Hurts a little bit Pain Location: chest "from all the coughing" Pain Descriptors / Indicators: Discomfort Pain Intervention(s): Monitored during session    Home Living                      Prior Function            PT Goals (current goals can now be found in the care plan section) Acute Rehab PT Goals Patient Stated Goal: to get up and feel better PT Goal Formulation: With patient Time For Goal Achievement: 04/25/18 Potential to Achieve Goals: Good Progress towards PT goals: Progressing toward goals    Frequency    Min 3X/week      PT Plan Current plan remains appropriate    Co-evaluation              AM-PAC PT "6  Clicks" Mobility   Outcome Measure  Help needed turning from your back to your side while in a flat bed without using bedrails?: A Little Help needed moving from lying on your back to sitting on the side of a flat bed without using bedrails?: A Little Help needed moving to and from a bed to a chair (including a wheelchair)?: A Little Help needed standing up from a chair using your arms (e.g., wheelchair or bedside chair)?: A Little Help needed to walk in hospital room?: A Little Help needed climbing 3-5 steps with a railing? : A Lot 6 Click Score: 17    End of Session Equipment Utilized During Treatment: Gait belt;Oxygen Activity Tolerance: Patient limited by fatigue;Patient tolerated treatment well Patient left: with call bell/phone within reach;in chair Nurse Communication:  Mobility status PT Visit Diagnosis: Unsteadiness on feet (R26.81);History of falling (Z91.81);Difficulty in walking, not elsewhere classified (R26.2)     Time: 1000-1023 PT Time Calculation (min) (ACUTE ONLY): 23 min  Charges:  $Gait Training: 8-22 mins $Therapeutic Activity: 8-22 mins                     Etta GrandchildSean Ambreen Tufte, PT, DPT Acute Rehabilitation Services Pager: [phone] Office: [phone]     Etta GrandchildSean Deysy Schabel 04/15/2018, 10:31 AM

## 2018-04-15 NOTE — Care Management Important Message (Signed)
Important Message  Patient Details  Name: Tammy Hines MRN: [id number] Date of Birth: Feb 28, 1961   Medicare Important Message Given:  Yes    Trennon Torbeck Stefan Church 04/15/2018, 2:31 PM

## 2018-04-15 NOTE — Clinical Social Work Note (Signed)
CSW continuing to follow to determine if pt will d/c home with hh or need SNF.   JasperBridget Isham Smitherman, [place] [phone]

## 2018-04-15 NOTE — Progress Notes (Signed)
PROGRESS NOTE  Tammy JarvisGretchen K Hines  WUJ:[id number] DOB: Mar 14, 1961 DOA: 04/10/2018 PCP: Quitman LivingsHassan, Sami, MD   Brief Narrative: Tammy KrillGretchen K Hines a 58 y.o.femalewith medical history significant for but not limited toCOPD, Asthma,and fibromyalgia, presenting with 2 day h/o productive cough and fever associated with 2 episodes of "syncope" after taking delsym.  Assessment & Plan: Principal Problem:   Acute respiratory failure with hypoxia (HCC) Active Problems:   Bipolar disorder (HCC)   Hypertension   Hyperlipidemia   GERD without esophagitis   Bipolar 1 disorder, depressed, severe (HCC)   COPD with acute exacerbation (HCC)  Acute hypoxic respiratory failure due to COPD exacerbation: Has improved with augmentation of IV steroids. Has been on venturi mask due to being a mouth breather, down to [place] today. BNP 81, RVP and influenza negative. Has history of COPD and asthma on problem list, has been long term tobacco user. - Continue supplemental oxygen to maintain SpO2 >90%. Will need ambulatory pulse oximetry tomorrow AM - Continue q6h duoneb and albuterol q2h prn. DME Neb ordered - Deescalate to po steroids 1/10 and DC if stable - s/p ceftriaxone x5 days - Continue dulera - Smoking cessation counseling - Needs pulmonology follow up  Unintentional overdose of antitussive: Some CNS depression following overuse of delsym.  - Counseled patient on improper use of medications, prescription and OTC. - Continue home medications, which include many sedating medications. Decreased gabapentin 800mg  QID > 600mg  QID and changed hydroxyzine TID to prn. - Lozenges prn cough  HTN:  - Continue atenolol  Chronic pancreatitis: No pain - Continue creon  OAB:  - Continue oxybutynin  GERD:  - Continue PPI  Hyperlipidemia:  - Continue statin  Sedation: Resolved. Polypharmacy was discussed with patient and encouraged to address dose reduction and decreased pill burden with PCP.  Bipolar  disorder, insomnia:  - Continue wellbutrin, clomipramine, xanax, SSRI, gabapentin, atarax, asenapine.   Overweight: BMI 27. - Weight loss recommended  DVT prophylaxis: Lovenox Code Status: Full Family Communication: None at bedside Disposition Plan: Pt clinically improved today, still on high dose IV steroids. Will deescalate this PM and start po in AM tomorrow. If stable may be discharged. Functional progress has also improved to the point that she may be safe for discharge to home. In any event, I doubt she would consent to SNF placement.   Consultants:   None  Procedures:   None  Antimicrobials:  Ceftriaxone 1/5 - 1/9   Subjective: Wheezing is improved by nebulizer therapies, no chest pain, still persistent hacking cough but better with cough drops.   Objective: Vitals:   04/15/18 0827 04/15/18 0830 04/15/18 0850 04/15/18 0900  BP: (!) 153/93 (!) 153/93    Pulse: (!) 58 (!) 58    Resp:  16    Temp:  97.7 F (36.5 C)    TempSrc:  Oral    SpO2:  98% 97% 97%  Weight:      Height:        Intake/Output Summary (Last 24 hours) at 04/15/2018 1218 Last data filed at 04/15/2018 0321 Gross per 24 hour  Intake -  Output 650 ml  Net -650 ml   Filed Weights   04/10/18 1521  Weight: 78.5 kg   Gen: 58 y.o. female in no distress Pulm: Nonlabored on supplemental oxygen, increased WOB with exertion, down to 89% per report. Expiratory wheezing improved, better air movement. CV: Regular rate and rhythm. No murmur, rub, or gallop. No JVD, no dependent edema. GI: Abdomen soft, non-tender, non-distended,  with normoactive bowel sounds.  Ext: Warm, no deformities Skin: No rashes, lesions or ulcers on visualized skin. Neuro: Alert and oriented. No focal neurological deficits. Psych: Judgement and insight appear fair. Mood euthymic & affect congruent. Behavior is appropriate.    Data Reviewed: I have personally reviewed following labs and imaging studies  CBC: Recent Labs  Lab  04/10/18 2115 04/11/18 0521 04/13/18 0407  WBC 11.4* 12.3* 11.2*  NEUTROABS 8.3*  --   --   HGB 11.0* 10.6* 10.1*  HCT 38.5 35.7* 33.6*  MCV 88.9 86.9 84.4  PLT 214 221 253   Basic Metabolic Panel: Recent Labs  Lab 04/10/18 1553 04/11/18 0521 04/13/18 0407  NA 136 135 138  K 4.4 4.6 4.8  CL 104 104 104  CO2 26 21* 29  GLUCOSE 119* 162* 158*  BUN 16 13 18   CREATININE 1.09* 0.68 0.67  CALCIUM 8.7* 8.5* 8.9   GFR: Estimated Creatinine Clearance: 82.8 mL/min (by C-G formula based on SCr of 0.67 mg/dL). Liver Function Tests: Recent Labs  Lab 04/10/18 1553  AST 23  ALT 18  ALKPHOS 79  BILITOT 0.1*  PROT 6.5  ALBUMIN 2.9*   Recent Labs  Lab 04/10/18 1608  AMMONIA 33   Urine analysis:    Component Value Date/Time   COLORURINE AMBER (A) 04/10/2018 1639   APPEARANCEUR CLOUDY (A) 04/10/2018 1639   LABSPEC 1.024 04/10/2018 1639   PHURINE 5.0 04/10/2018 1639   GLUCOSEU NEGATIVE 04/10/2018 1639   HGBUR NEGATIVE 04/10/2018 1639   BILIRUBINUR NEGATIVE 04/10/2018 1639   KETONESUR NEGATIVE 04/10/2018 1639   PROTEINUR 100 (A) 04/10/2018 1639   NITRITE NEGATIVE 04/10/2018 1639   LEUKOCYTESUR NEGATIVE 04/10/2018 1639   Recent Results (from the past 240 hour(s))  Urine culture     Status: None   Collection Time: 04/10/18  4:39 PM  Result Value Ref Range Status   Specimen Description URINE, CLEAN CATCH  Final   Special Requests Normal  Final   Culture   Final    NO GROWTH Performed at [facility], [address], [place], [place] [address]    Report Status 04/11/2018 FINAL  Final  Blood Culture (routine x 2)     Status: None   Collection Time: 04/10/18  5:38 PM  Result Value Ref Range Status   Specimen Description BLOOD RIGHT HAND  Final   Special Requests   Final    BOTTLES DRAWN AEROBIC AND ANAEROBIC Blood Culture adequate volume   Culture   Final    NO GROWTH 5 DAYS Performed at [facility], [address], [place], [place] [address]    Report  Status 04/15/2018 FINAL  Final  Blood Culture (routine x 2)     Status: None   Collection Time: 04/10/18  5:38 PM  Result Value Ref Range Status   Specimen Description BLOOD RIGHT ANTECUBITAL  Final   Special Requests AEROBIC BOTTLE ONLY Blood Culture adequate volume  Final   Culture   Final    NO GROWTH 5 DAYS Performed at [facility], [address], [place], [place] [address]    Report Status 04/15/2018 FINAL  Final  Respiratory Panel by PCR     Status: None   Collection Time: 04/11/18 12:04 AM  Result Value Ref Range Status   Adenovirus NOT DETECTED NOT DETECTED Final   Coronavirus 229E NOT DETECTED NOT DETECTED Final   Coronavirus HKU1 NOT DETECTED NOT DETECTED Final   Coronavirus NL63 NOT DETECTED  NOT DETECTED Final   Coronavirus OC43 NOT DETECTED NOT DETECTED Final   Metapneumovirus NOT DETECTED NOT DETECTED Final   Rhinovirus / Enterovirus NOT DETECTED NOT DETECTED Final   Influenza A NOT DETECTED NOT DETECTED Final   Influenza B NOT DETECTED NOT DETECTED Final   Parainfluenza Virus 1 NOT DETECTED NOT DETECTED Final   Parainfluenza Virus 2 NOT DETECTED NOT DETECTED Final   Parainfluenza Virus 3 NOT DETECTED NOT DETECTED Final   Parainfluenza Virus 4 NOT DETECTED NOT DETECTED Final   Respiratory Syncytial Virus NOT DETECTED NOT DETECTED Final   Bordetella pertussis NOT DETECTED NOT DETECTED Final   Chlamydophila pneumoniae NOT DETECTED NOT DETECTED Final   Mycoplasma pneumoniae NOT DETECTED NOT DETECTED Final      Radiology Studies: No results found.  Scheduled Meds: . aspirin EC  325 mg Oral Daily  . atenolol  50 mg Oral BID  . buPROPion  150 mg Oral Daily  . clomiPRAMINE  50 mg Oral QHS  . dicyclomine  10 mg Oral TID AC  . enoxaparin (LOVENOX) injection  40 mg Subcutaneous Daily  . FLUoxetine  40 mg Oral Daily  . gabapentin  600 mg Oral QID  . ipratropium-albuterol  3 mL Nebulization Q6H  . lipase/protease/amylase  36,000 Units Oral TID WC  . mouth rinse   15 mL Mouth Rinse BID  . mometasone-formoterol  1 puff Inhalation BID  . olopatadine  1 drop Both Eyes BID  . oxybutynin  15 mg Oral QHS  . pantoprazole  40 mg Oral Daily  . [START ON 04/16/2018] predniSONE  40 mg Oral Q breakfast  . simvastatin  40 mg Oral QHS   Continuous Infusions:    LOS: 5 days   Time spent: 25 minutes.  Tyrone Nine, MD [facility] [email or web address] Password [facility] 04/15/2018, 12:18 PM

## 2018-04-16 MED ORDER — ALBUTEROL SULFATE (2.5 MG/3ML) 0.083% IN NEBU: 2.5000 mg | INHALATION_SOLUTION | Freq: Four times a day (QID) | RESPIRATORY_TRACT | 0 refills | Status: AC | PRN

## 2018-04-16 MED ORDER — PREDNISONE 10 MG PO TABS: ORAL_TABLET | ORAL | 0 refills | Status: AC

## 2018-04-16 MED ORDER — MOMETASONE FURO-FORMOTEROL FUM 200-5 MCG/ACT IN AERO: 1.0000 | INHALATION_SPRAY | Freq: Two times a day (BID) | RESPIRATORY_TRACT | 0 refills | Status: AC

## 2018-04-16 NOTE — Clinical Social Work Note (Signed)
Per MD pt will d/c home today. RNCM notified. Clinical Social Worker will sign off for now as social work intervention is no longer needed. Please consult [place] again if new need arises.   Clarisse Gouge A Winfield Caba 04/16/2018

## 2018-04-16 NOTE — Care Management Note (Addendum)
Case Management Note  Patient Details  Name: Tammy Hines MRN: [id number] Date of Birth: 1961/02/22  Subjective/Objective:    For dc today, refusing SNF, NCM gave patient agency list from Medicare.gov and she chose [facility]  For HHRN, HHPT, HHAIDE.  Referral given to [facility] with [facility].  Also Vaughan Basta will be bringing up the neb machine and home oxygen.                 Action/Plan: DC home when ready.   Expected Discharge Date:  04/16/18               Expected Discharge Plan:  Home w Home Health Services  In-House Referral:  Clinical Social Work  Discharge planning Services  CM Consult  Post Acute Care Choice:  Home Health Choice offered to:  Patient  DME Arranged:   oxygen, neb machine  DME Agency:   [facility]  HH Arranged:  RN, PT, Nurse's Aide HH Agency:  [facility]  Status of Service:  Completed, signed off  If discussed at Long Length of Stay Meetings, dates discussed:    Additional Comments:  Leone Haven, RN 04/16/2018, 12:44 PM

## 2018-04-16 NOTE — Progress Notes (Signed)
SATURATION QUALIFICATIONS: (This note is used to comply with regulatory documentation for home oxygen)  Patient Saturations on Room Air at Rest = 94%  Patient Saturations on Room Air while Ambulating = 87%  Patient Saturations on 1 Liters of oxygen while Ambulating = 95%  Please briefly explain why patient needs home oxygen: Patient desats while ambulating.

## 2018-04-16 NOTE — Discharge Summary (Signed)
Physician Discharge Summary  Tammy Hines ZOX:[id number] DOB: 10-03-1960 DOA: 04/10/2018  PCP: Quitman Livings, MD  Admit date: 04/10/2018 Discharge date: 04/16/2018  Admitted From: Home Disposition: Home   Recommendations for Outpatient Follow-up:  1. Follow up with PCP in 1-2 weeks.  Home Health: PT, RN, aide Equipment/Devices: 1L supplemental oxygen Discharge Condition: Stable, improved CODE STATUS: Full Diet recommendation: Heart healthy  Brief/Interim Summary: Tammy Hines a 58 y.o.femalewith medical history significantfor but not limited toCOPD,Asthma,andfibromyalgia, presenting with 2 day h/o productive cough and fever associated with2episodes of "syncope" after taking delsym. She was admitted and treated for COPD exacerbation with slow improvement. Despite resolution of wheezing, continues to have oxygen requirement. SNF is recommended due to functional limitations due to dyspnea, though patient declines this and requests discharge to home on home oxygen. She will follow up with her PCP/pulmonology closely.  Discharge Diagnoses:  Principal Problem:   Acute respiratory failure with hypoxia (HCC) Active Problems:   Bipolar disorder (HCC)   Hypertension   Hyperlipidemia   GERD without esophagitis   Bipolar 1 disorder, depressed, severe (HCC)   COPD with acute exacerbation (HCC)  Acute hypoxic respiratory failure due to COPD exacerbation: Has improved with augmentation of IV steroids and sustained improvement with deescalation.  BNP 81, RVP and influenza negative. Has history of COPD and asthma on problem list, has been long term tobacco user. - DC on home oxygen pending follow up. - Continue q6h albuterol at home. DME Neb ordered. - Deescalated to po steroids 1/10, remains stable, will continue prednisone taper at DC - s/p ceftriaxone x5 days - Continue dulera - Smoking cessation counseling - Needs pulmonology follow up and/or formal PFTs  Unintentional  overdose of antitussive: Some CNS depression following overuse of delsym.  - Counseled patient on improper use of medications, prescription and OTC. Asked for tussionex but risk of this medication/polypharmacy was reviewed and no new prescriptions written. - Continue home medications, which include many sedating medications. Decreased gabapentin 800mg  QID > 600mg  QID while inpatient though pt will need PCP follow up to determine appropriate dosing.   - Lozenges prn cough providing good control.  HTN:  - Continue atenolol  Chronic pancreatitis: No pain - Continue creon  OAB:  - Continue oxybutynin  GERD:  - Continue PPI  Hyperlipidemia:  - Continue statin  Sedation: Resolved. Polypharmacy was discussed with patient and encouraged to address dose reduction and decreased pill burden with PCP.  Bipolar disorder, insomnia:  - Continue wellbutrin, clomipramine, xanax, SSRI, gabapentin, atarax, asenapine.   Overweight: BMI 27. - Weight loss recommended   Discharge Instructions Discharge Instructions    Diet - low sodium heart healthy   Complete by:  As directed    Discharge instructions   Complete by:  As directed    You were admitted for shortness of breath and have improved with treatment for bronchitis.  - Continue taking prednisone taper as directed (presscription printed) - Start using dulera once daily to help keep airways open - You may use albuterol nebulizer as needed for shortness of breath or wheezing - Follow up with your PCP in the next week for recheck or seek medical attention right away if your symptoms return/worsen.   Increase activity slowly   Complete by:  As directed      Allergies as of 04/16/2018      Reactions   Ampicillin Hives, Nausea And Vomiting   Has patient had a PCN reaction causing immediate rash, facial/tongue/throat swelling, SOB or lightheadedness  with hypotension: No Has patient had a PCN reaction causing severe rash involving mucus  membranes or skin necrosis: No Has patient had a PCN reaction that required hospitalization No Has patient had a PCN reaction occurring within the last 10 years: No If all of the above answers are "NO", then may proceed with Cephalosporin use.   Cleocin [clindamycin Hcl] Other (See Comments)   "deathly sick"   Clonazepam Other (See Comments)   Unknown reaction   Other Other (See Comments)   Allergy to mold, down and feathers per allergy test   Penicillins Other (See Comments)   Has patient had a PCN reaction causing immediate rash, facial/tongue/throat swelling, SOB or lightheadedness with hypotension: No Has patient had a PCN reaction causing severe rash involving mucus membranes or skin necrosis: No Has patient had a PCN reaction that required hospitalization No Has patient had a PCN reaction occurring within the last 10 years: No If all of the above answers are "NO", then may proceed with Cephalosporin use. Pt told not to take because of reaction to ampicillin - no      Medication List    TAKE these medications   albuterol 108 (90 Base) MCG/ACT inhaler Commonly known as:  PROVENTIL HFA;VENTOLIN HFA Inhale 2 puffs into the lungs every 6 (six) hours as needed for wheezing or shortness of breath. What changed:  Another medication with the same name was added. Make sure you understand how and when to take each.   albuterol (2.5 MG/3ML) 0.083% nebulizer solution Commonly known as:  PROVENTIL Take 3 mLs (2.5 mg total) by nebulization every 6 (six) hours as needed for wheezing or shortness of breath. What changed:  You were already taking a medication with the same name, and this prescription was added. Make sure you understand how and when to take each.   alprazolam 2 MG tablet Commonly known as:  XANAX Take 2 mg by mouth 3 (three) times daily.   aspirin EC 325 MG tablet Take 325 mg by mouth daily.   atenolol 50 MG tablet Commonly known as:  TENORMIN Take 100 mg by mouth at  bedtime.   buPROPion 150 MG 24 hr tablet Commonly known as:  WELLBUTRIN XL Take 150 mg by mouth daily.   ciclopirox 8 % solution Commonly known as:  PENLAC Apply topically at bedtime. Apply over nail and surrounding skin. Apply daily over previous coat. After seven (7) days, may remove with alcohol and continue cycle.   clomiPRAMINE 50 MG capsule Commonly known as:  ANAFRANIL Take 50 mg by mouth at bedtime.   CREON 36000 UNITS Cpep capsule Generic drug:  lipase/protease/amylase Take 36,000 Units by mouth 3 (three) times daily with meals.   dicyclomine 10 MG capsule Commonly known as:  BENTYL Take 10 mg by mouth 3 (three) times daily before meals.   FLUoxetine 40 MG capsule Commonly known as:  PROZAC Take 120 mg by mouth daily.   gabapentin 800 MG tablet Commonly known as:  NEURONTIN Take 800 mg by mouth 4 (four) times daily.   hydrOXYzine 25 MG capsule Commonly known as:  VISTARIL Take 25 mg by mouth 4 (four) times daily.   mometasone-formoterol 200-5 MCG/ACT Aero Commonly known as:  DULERA Inhale 1 puff into the lungs 2 (two) times daily.   MYRBETRIQ PO Take 1 tablet by mouth every evening.   naproxen 500 MG tablet Commonly known as:  NAPROSYN Take 500 mg by mouth 2 (two) times daily.   oxybutynin 15 MG 24  hr tablet Commonly known as:  DITROPAN XL Take 15 mg by mouth at bedtime.   oxyCODONE-acetaminophen 10-325 MG tablet Commonly known as:  PERCOCET Take 1 tablet by mouth 3 (three) times daily.   pantoprazole 40 MG tablet Commonly known as:  PROTONIX Take 40 mg by mouth daily.   predniSONE 10 MG tablet Commonly known as:  DELTASONE Take 4 tablets (40 mg total) by mouth daily with breakfast for 3 days, THEN 3 tablets (30 mg total) daily with breakfast for 3 days, THEN 2 tablets (20 mg total) daily with breakfast for 3 days, THEN 1 tablet (10 mg total) daily with breakfast for 3 days. Start taking on:  April 17, 2018   SAPHRIS 10 MG Subl Generic drug:   Asenapine Maleate Take 10 mg by mouth See admin instructions. Place one tablet (10 mg) under the tongue twice during the night   simvastatin 40 MG tablet Commonly known as:  ZOCOR Take 40 mg by mouth at bedtime.            Durable Medical Equipment  (From admission, onward)         Start     Ordered   04/15/18 1228  For home use only DME Nebulizer machine  Once    Question:  Patient needs a nebulizer to treat with the following condition  Answer:  COPD exacerbation (HCC)   04/15/18 1227         Follow-up Information    Quitman LivingsHassan, Sami, MD. Schedule an appointment as soon as possible for a visit in 1 week(s).   Specialty:  Internal Medicine Contact information: [address] [place] [id number] [phone]          Allergies  Allergen Reactions  . Ampicillin Hives and Nausea And Vomiting    Has patient had a PCN reaction causing immediate rash, facial/tongue/throat swelling, SOB or lightheadedness with hypotension: No Has patient had a PCN reaction causing severe rash involving mucus membranes or skin necrosis: No Has patient had a PCN reaction that required hospitalization No Has patient had a PCN reaction occurring within the last 10 years: No If all of the above answers are "NO", then may proceed with Cephalosporin use.   Marland Kitchen. Cleocin [Clindamycin Hcl] Other (See Comments)    "deathly sick"  . Clonazepam Other (See Comments)    Unknown reaction  . Other Other (See Comments)    Allergy to mold, down and feathers per allergy test  . Penicillins Other (See Comments)    Has patient had a PCN reaction causing immediate rash, facial/tongue/throat swelling, SOB or lightheadedness with hypotension: No Has patient had a PCN reaction causing severe rash involving mucus membranes or skin necrosis: No Has patient had a PCN reaction that required hospitalization No Has patient had a PCN reaction occurring within the last 10 years: No If all of the above answers are  "NO", then may proceed with Cephalosporin use.   Pt told not to take because of reaction to ampicillin - no    Consultations:  None  Procedures/Studies: Ct Head Wo Contrast  Result Date: 04/10/2018 CLINICAL DATA:  Larey SeatFell at home.  Facial lacerations. EXAM: CT HEAD WITHOUT CONTRAST CT CERVICAL SPINE WITHOUT CONTRAST TECHNIQUE: Multidetector CT imaging of the head and cervical spine was performed following the standard protocol without intravenous contrast. Multiplanar CT image reconstructions of the cervical spine were also generated. COMPARISON:  CT HEAD November 27, 2016 FINDINGS: CT HEAD FINDINGS BRAIN: No intraparenchymal hemorrhage, mass effect  nor midline shift. The ventricles and sulci are normal. No acute large vascular territory infarcts. No abnormal extra-axial fluid collections. Basal cisterns are patent. Small parafalcine dural calcifications are unchanged. VASCULAR: Unremarkable. SKULL/SOFT TISSUES: No skull fracture. Small LEFT frontal scalp hematoma without subcutaneous gas or radiopaque foreign bodies. ORBITS/SINUSES: The included ocular globes and orbital contents are normal.Mild paranasal sinus mucosal thickening. Mastoid air cells are well aerated. OTHER: Patient is edentulous. CT CERVICAL SPINE FINDINGS ALIGNMENT: Straightened lordosis.  Grade 1 C4-5 anterolisthesis. SKULL BASE AND VERTEBRAE: Cervical vertebral bodies and posterior elements are intact. Moderate to severe C5-6 and C6-7 disc height loss, mild at C4-5 with proportional endplate spurring. Moderate to severe for cervical facet arthropathy. Calcified longus coli insertion. Moderate C1-2 osteoarthrosis. No destructive bony lesions. SOFT TISSUES AND SPINAL CANAL: Nonacute. 10 mm RIGHT thyroid nodule, below size follow-up recommendation by consensus criteria. DISC LEVELS: Mild C5-6 canal stenosis. Multilevel severe neural foraminal narrowing. UPPER CHEST: Biapical ground-glass opacities. OTHER: None. IMPRESSION: CT HEAD: 1. No  acute intracranial process. Small LEFT frontal scalp hematoma. 2. Otherwise negative noncontrast CT HEAD. CT CERVICAL SPINE: 1. No acute fracture. Grade 1 C4-5 anterolisthesis on a degenerative basis. 2. Multilevel severe neural foraminal narrowing. 3. Biapical lung ground-glass opacities seen with pulmonary edema or infection. Electronically Signed   By: Awilda Metro M.D.   On: 04/10/2018 20:32   Ct Cervical Spine Wo Contrast  Result Date: 04/10/2018 CLINICAL DATA:  Larey Seat at home.  Facial lacerations. EXAM: CT HEAD WITHOUT CONTRAST CT CERVICAL SPINE WITHOUT CONTRAST TECHNIQUE: Multidetector CT imaging of the head and cervical spine was performed following the standard protocol without intravenous contrast. Multiplanar CT image reconstructions of the cervical spine were also generated. COMPARISON:  CT HEAD November 27, 2016 FINDINGS: CT HEAD FINDINGS BRAIN: No intraparenchymal hemorrhage, mass effect nor midline shift. The ventricles and sulci are normal. No acute large vascular territory infarcts. No abnormal extra-axial fluid collections. Basal cisterns are patent. Small parafalcine dural calcifications are unchanged. VASCULAR: Unremarkable. SKULL/SOFT TISSUES: No skull fracture. Small LEFT frontal scalp hematoma without subcutaneous gas or radiopaque foreign bodies. ORBITS/SINUSES: The included ocular globes and orbital contents are normal.Mild paranasal sinus mucosal thickening. Mastoid air cells are well aerated. OTHER: Patient is edentulous. CT CERVICAL SPINE FINDINGS ALIGNMENT: Straightened lordosis.  Grade 1 C4-5 anterolisthesis. SKULL BASE AND VERTEBRAE: Cervical vertebral bodies and posterior elements are intact. Moderate to severe C5-6 and C6-7 disc height loss, mild at C4-5 with proportional endplate spurring. Moderate to severe for cervical facet arthropathy. Calcified longus coli insertion. Moderate C1-2 osteoarthrosis. No destructive bony lesions. SOFT TISSUES AND SPINAL CANAL: Nonacute. 10 mm  RIGHT thyroid nodule, below size follow-up recommendation by consensus criteria. DISC LEVELS: Mild C5-6 canal stenosis. Multilevel severe neural foraminal narrowing. UPPER CHEST: Biapical ground-glass opacities. OTHER: None. IMPRESSION: CT HEAD: 1. No acute intracranial process. Small LEFT frontal scalp hematoma. 2. Otherwise negative noncontrast CT HEAD. CT CERVICAL SPINE: 1. No acute fracture. Grade 1 C4-5 anterolisthesis on a degenerative basis. 2. Multilevel severe neural foraminal narrowing. 3. Biapical lung ground-glass opacities seen with pulmonary edema or infection. Electronically Signed   By: Awilda Metro M.D.   On: 04/10/2018 20:32   Dg Chest Port 1 View  Result Date: 04/11/2018 CLINICAL DATA:  Acute respiratory failure with hypoxia. EXAM: PORTABLE CHEST 1 VIEW COMPARISON:  AP view yesterday. FINDINGS: Lower lung volumes from prior exam. Unchanged cardiomegaly and mediastinal contours. Implanted loop recorder projects over the left chest. Peribronchial thickening. Peripheral opacity in the left mid  lung is likely secondary to osseous overlap and known rib fracture. No pleural effusion or pneumothorax. IMPRESSION: Lower lung volumes from prior exam. Peribronchial thickening consistent with bronchitis. Electronically Signed   By: Narda Rutherford M.D.   On: 04/11/2018 00:36   Dg Chest Port 1 View  Result Date: 04/10/2018 CLINICAL DATA:  Cough for 2 days, fell onto buttocks trying to get into car then fell onto face trying to stand up again, history stroke, hypertension, COPD, asthma, smoker EXAM: PORTABLE CHEST 1 VIEW COMPARISON:  Portable exam 1546 hours compared to 08/02/2017 FINDINGS: Loop recorder projects over LEFT heart. Enlargement of cardiac silhouette. Mediastinal contours and pulmonary vascularity normal. Bibasilar atelectasis. Lungs otherwise clear. No pulmonary infiltrate, pleural effusion or pneumothorax. No additional 8 fracture identified at the anterior LEFT fourth rib.  IMPRESSION: Enlargement of cardiac silhouette. Bibasilar atelectasis. Fractured anterior LEFT fourth rib. Electronically Signed   By: Ulyses Southward M.D.   On: 04/10/2018 16:08    Subjective: Feels much better, breathing at baseline she says, though frequent nonproductive cough is still bothersome. Lozenges help. No chest pain. Has regained strength over the past couple days and wants to go home.  Discharge Exam: Vitals:   04/16/18 0810 04/16/18 0811  BP:    Pulse:    Resp:    Temp:    SpO2: 90% 90%   General: Pt is alert, awake, not in acute distress Cardiovascular: RRR, S1/S2 +, no rubs, no gallops Respiratory: Nonlabored, no wheezing, no rhonchi Abdominal: Soft, NT, ND, bowel sounds + Extremities: No edema, no cyanosis  Labs: BNP (last 3 results) Recent Labs    04/10/18 1643  BNP 81.6   Basic Metabolic Panel: Recent Labs  Lab 04/10/18 1553 04/11/18 0521 04/13/18 0407  NA 136 135 138  K 4.4 4.6 4.8  CL 104 104 104  CO2 26 21* 29  GLUCOSE 119* 162* 158*  BUN 16 13 18   CREATININE 1.09* 0.68 0.67  CALCIUM 8.7* 8.5* 8.9   Liver Function Tests: Recent Labs  Lab 04/10/18 1553  AST 23  ALT 18  ALKPHOS 79  BILITOT 0.1*  PROT 6.5  ALBUMIN 2.9*   No results for input(s): LIPASE, AMYLASE in the last 168 hours. Recent Labs  Lab 04/10/18 1608  AMMONIA 33   CBC: Recent Labs  Lab 04/10/18 2115 04/11/18 0521 04/13/18 0407  WBC 11.4* 12.3* 11.2*  NEUTROABS 8.3*  --   --   HGB 11.0* 10.6* 10.1*  HCT 38.5 35.7* 33.6*  MCV 88.9 86.9 84.4  PLT 214 221 253   Cardiac Enzymes: No results for input(s): CKTOTAL, CKMB, CKMBINDEX, TROPONINI in the last 168 hours. BNP: Invalid input(s): POCBNP CBG: No results for input(s): GLUCAP in the last 168 hours. D-Dimer No results for input(s): DDIMER in the last 72 hours. Hgb A1c No results for input(s): HGBA1C in the last 72 hours. Lipid Profile No results for input(s): CHOL, HDL, LDLCALC, TRIG, CHOLHDL, LDLDIRECT in the  last 72 hours. Thyroid function studies No results for input(s): TSH, T4TOTAL, T3FREE, THYROIDAB in the last 72 hours.  Invalid input(s): FREET3 Anemia work up No results for input(s): VITAMINB12, FOLATE, FERRITIN, TIBC, IRON, RETICCTPCT in the last 72 hours. Urinalysis    Component Value Date/Time   COLORURINE AMBER (A) 04/10/2018 1639   APPEARANCEUR CLOUDY (A) 04/10/2018 1639   LABSPEC 1.024 04/10/2018 1639   PHURINE 5.0 04/10/2018 1639   GLUCOSEU NEGATIVE 04/10/2018 1639   HGBUR NEGATIVE 04/10/2018 1639   BILIRUBINUR NEGATIVE 04/10/2018 1639  KETONESUR NEGATIVE 04/10/2018 1639   PROTEINUR 100 (A) 04/10/2018 1639   NITRITE NEGATIVE 04/10/2018 1639   LEUKOCYTESUR NEGATIVE 04/10/2018 1639    Microbiology Recent Results (from the past 240 hour(s))  Urine culture     Status: None   Collection Time: 04/10/18  4:39 PM  Result Value Ref Range Status   Specimen Description URINE, CLEAN CATCH  Final   Special Requests Normal  Final   Culture   Final    NO GROWTH Performed at [facility], [address], [place], [place] [address]    Report Status 04/11/2018 FINAL  Final  Blood Culture (routine x 2)     Status: None   Collection Time: 04/10/18  5:38 PM  Result Value Ref Range Status   Specimen Description BLOOD RIGHT HAND  Final   Special Requests   Final    BOTTLES DRAWN AEROBIC AND ANAEROBIC Blood Culture adequate volume   Culture   Final    NO GROWTH 5 DAYS Performed at [facility], [address], [place], [place] [address]    Report Status 04/15/2018 FINAL  Final  Blood Culture (routine x 2)     Status: None   Collection Time: 04/10/18  5:38 PM  Result Value Ref Range Status   Specimen Description BLOOD RIGHT ANTECUBITAL  Final   Special Requests AEROBIC BOTTLE ONLY Blood Culture adequate volume  Final   Culture   Final    NO GROWTH 5 DAYS Performed at [facility], [address], [place], [place] [address]    Report Status 04/15/2018 FINAL   Final  Respiratory Panel by PCR     Status: None   Collection Time: 04/11/18 12:04 AM  Result Value Ref Range Status   Adenovirus NOT DETECTED NOT DETECTED Final   Coronavirus 229E NOT DETECTED NOT DETECTED Final   Coronavirus HKU1 NOT DETECTED NOT DETECTED Final   Coronavirus NL63 NOT DETECTED NOT DETECTED Final   Coronavirus OC43 NOT DETECTED NOT DETECTED Final   Metapneumovirus NOT DETECTED NOT DETECTED Final   Rhinovirus / Enterovirus NOT DETECTED NOT DETECTED Final   Influenza A NOT DETECTED NOT DETECTED Final   Influenza B NOT DETECTED NOT DETECTED Final   Parainfluenza Virus 1 NOT DETECTED NOT DETECTED Final   Parainfluenza Virus 2 NOT DETECTED NOT DETECTED Final   Parainfluenza Virus 3 NOT DETECTED NOT DETECTED Final   Parainfluenza Virus 4 NOT DETECTED NOT DETECTED Final   Respiratory Syncytial Virus NOT DETECTED NOT DETECTED Final   Bordetella pertussis NOT DETECTED NOT DETECTED Final   Chlamydophila pneumoniae NOT DETECTED NOT DETECTED Final   Mycoplasma pneumoniae NOT DETECTED NOT DETECTED Final    Time coordinating discharge: Approximately 40 minutes  Tyrone Nine, MD  [facility] 04/16/2018, 10:36 AM Pager [phone]

## 2018-10-22 ENCOUNTER — Emergency Department (HOSPITAL_COMMUNITY)
Admission: EM | Admit: 2018-10-22 | Discharge: 2018-10-22 | Disposition: A | Discharge status: Home / Self Care | Payer: Medicare Other | Attending: Emergency Medicine | Admitting: Emergency Medicine

## 2018-10-22 ENCOUNTER — Emergency Department (HOSPITAL_COMMUNITY): Payer: Medicare Other

## 2018-10-22 ENCOUNTER — Other Ambulatory Visit: Payer: Self-pay

## 2018-10-22 ENCOUNTER — Encounter (HOSPITAL_COMMUNITY): Payer: Self-pay

## 2018-10-22 DIAGNOSIS — R531 Weakness: Secondary | ICD-10-CM | POA: Diagnosis present

## 2018-10-22 DIAGNOSIS — R600 Localized edema: Secondary | ICD-10-CM | POA: Insufficient documentation

## 2018-10-22 DIAGNOSIS — J45909 Unspecified asthma, uncomplicated: Secondary | ICD-10-CM | POA: Diagnosis not present

## 2018-10-22 DIAGNOSIS — F17210 Nicotine dependence, cigarettes, uncomplicated: Secondary | ICD-10-CM | POA: Insufficient documentation

## 2018-10-22 DIAGNOSIS — Z79899 Other long term (current) drug therapy: Secondary | ICD-10-CM | POA: Diagnosis not present

## 2018-10-22 DIAGNOSIS — J449 Chronic obstructive pulmonary disease, unspecified: Secondary | ICD-10-CM | POA: Insufficient documentation

## 2018-10-22 DIAGNOSIS — Z7982 Long term (current) use of aspirin: Secondary | ICD-10-CM | POA: Diagnosis not present

## 2018-10-22 DIAGNOSIS — Z95818 Presence of other cardiac implants and grafts: Secondary | ICD-10-CM | POA: Insufficient documentation

## 2018-10-22 DIAGNOSIS — Z8673 Personal history of transient ischemic attack (TIA), and cerebral infarction without residual deficits: Secondary | ICD-10-CM | POA: Insufficient documentation

## 2018-10-22 DIAGNOSIS — I10 Essential (primary) hypertension: Secondary | ICD-10-CM | POA: Insufficient documentation

## 2018-10-22 DIAGNOSIS — E860 Dehydration: Secondary | ICD-10-CM | POA: Diagnosis not present

## 2018-10-22 LAB — COMPREHENSIVE METABOLIC PANEL
ALT: 31 U/L (ref 0–44)
AST: 36 U/L (ref 15–41)
Albumin: 3.6 g/dL (ref 3.5–5.0)
Alkaline Phosphatase: 89 U/L (ref 38–126)
Anion gap: 8 (ref 5–15)
BUN: 14 mg/dL (ref 6–20)
CO2: 27 mmol/L (ref 22–32)
Calcium: 9.3 mg/dL (ref 8.9–10.3)
Chloride: 105 mmol/L (ref 98–111)
Creatinine, Ser: 1.05 mg/dL — ABNORMAL HIGH (ref 0.44–1.00)
GFR calc Af Amer: >60 mL/min (ref >60)
GFR calc non Af Amer: 58 mL/min — ABNORMAL LOW (ref >60)
Glucose, Bld: 109 mg/dL — ABNORMAL HIGH (ref 70–99)
Potassium: 4.1 mmol/L (ref 3.5–5.1)
Sodium: 140 mmol/L (ref 135–145)
Total Bilirubin: <0.1 mg/dL — ABNORMAL LOW (ref 0.3–1.2)
Total Protein: 6.4 g/dL — ABNORMAL LOW (ref 6.5–8.1)

## 2018-10-22 LAB — URINALYSIS, ROUTINE W REFLEX MICROSCOPIC
Bilirubin Urine: NEGATIVE
Glucose, UA: NEGATIVE mg/dL
Hgb urine dipstick: NEGATIVE
Ketones, ur: NEGATIVE mg/dL
Leukocytes,Ua: NEGATIVE
Nitrite: NEGATIVE
Protein, ur: NEGATIVE mg/dL
Specific Gravity, Urine: 1.009 (ref 1.005–1.030)
pH: 5 (ref 5.0–8.0)

## 2018-10-22 LAB — CBC WITH DIFFERENTIAL/PLATELET
Abs Immature Granulocytes: 0.03 10*3/uL (ref 0.00–0.07)
Basophils Absolute: 0.1 10*3/uL (ref 0.0–0.1)
Basophils Relative: 1 %
Eosinophils Absolute: 0.3 10*3/uL (ref 0.0–0.5)
Eosinophils Relative: 3 %
HCT: 40.2 % (ref 36.0–46.0)
Hemoglobin: 11.5 g/dL — ABNORMAL LOW (ref 12.0–15.0)
Immature Granulocytes: 0 %
Lymphocytes Relative: 23 %
Lymphs Abs: 2 10*3/uL (ref 0.7–4.0)
MCH: 24.7 pg — ABNORMAL LOW (ref 26.0–34.0)
MCHC: 28.6 g/dL — ABNORMAL LOW (ref 30.0–36.0)
MCV: 86.3 fL (ref 80.0–100.0)
Monocytes Absolute: 0.6 10*3/uL (ref 0.1–1.0)
Monocytes Relative: 7 %
Neutro Abs: 5.5 10*3/uL (ref 1.7–7.7)
Neutrophils Relative %: 66 %
Platelets: 275 10*3/uL (ref 150–400)
RBC: 4.66 MIL/uL (ref 3.87–5.11)
RDW: 15.9 % — ABNORMAL HIGH (ref 11.5–15.5)
WBC: 8.5 10*3/uL (ref 4.0–10.5)
nRBC: 0 % (ref 0.0–0.2)

## 2018-10-22 LAB — CBG MONITORING, ED: Glucose-Capillary: 119 mg/dL — ABNORMAL HIGH (ref 70–99)

## 2018-10-22 LAB — TROPONIN I (HIGH SENSITIVITY)
Troponin I (High Sensitivity): 6 ng/L (ref <18)
Troponin I (High Sensitivity): 3 ng/L (ref <18)

## 2018-10-22 LAB — BRAIN NATRIURETIC PEPTIDE: B Natriuretic Peptide: 21.2 pg/mL (ref 0.0–100.0)

## 2018-10-22 LAB — LACTIC ACID, PLASMA: Lactic Acid, Venous: 1.4 mmol/L (ref 0.5–1.9)

## 2018-10-22 MED ORDER — SODIUM CHLORIDE 0.9 % IV BOLUS
1000.0000 mL | Freq: Once | INTRAVENOUS | Status: AC
  Administered 2018-10-22: 1000 mL via INTRAVENOUS

## 2018-10-22 NOTE — ED Provider Notes (Signed)
[facility]   CSN: [id number] Arrival date & time: 10/22/18  [id number]    History   Chief Complaint Chief Complaint  Patient presents with   Loss of Consciousness   Bilateral leg edema    HPI Elyse JarvisGretchen K Primeau is a 58 y.o. female.     HPI  58 year old female presents with weakness, syncope, and EMS reports hypotension.  For 3 days she has been not feeling well or at least worse than typical.  She recently started her Lasix due to leg swelling after she had been noncompliant with that before because it was making her urinate too much.  However her leg swelling bilaterally became too much so she started taking Lasix with good effect.  She is also been short of breath over the last couple days and this morning had some transient chest pain that felt like a squeezing and is gone.  The leg swelling has improved.  She feels diffusely weak and she was going to go feed her cat but then the next thing she remembers was waking up in her walker chair.  She is not sure at what point she stopped moving.  She fell and hit her head a couple days ago but denies any headache now.  She has been feeling diffusely fatigued and tired over the last couple days.  No fevers.  No vomiting but she did have diarrhea a couple days ago.  She endorses some mild abdominal discomfort in her lower abdomen that she states feels like she needs to urinate.  Past Medical History:  Diagnosis Date   Anxiety    Arthritis    "right knee, left hip, going down my neck into my right shoulder" (06/24/2016)   Asthma    Bipolar 1 disorder (HCC)    Chronic lower back pain    Chronic neck pain    COPD (chronic obstructive pulmonary disease) (HCC)    Depression    /notes 06/24/2016   Family history of adverse reaction to anesthesia    "makes my mom throw up"    Fibromyalgia    GERD (gastroesophageal reflux disease)    [name]/notes 06/24/2016   Hepatitis B    History of  hiatal hernia    History of stomach ulcers    History of transient ischemic attack (TIA)    /RN 06/24/2016   Hyperlipidemia    Hypertension    Ischemic stroke (HCC)    acute/notes 06/24/2016; "left sided weakness"/RN (06/24/2016   Migraine    "a few/year now" (06/24/2016)   Obesity    PONV (postoperative nausea and vomiting)     Patient Active Problem List   Diagnosis Date Noted   Acute respiratory failure with hypoxia (HCC) 04/10/2018   COPD with acute exacerbation (HCC) 04/10/2018   Hammer toes of both feet 02/28/2018   MDD (major depressive disorder), recurrent severe, without psychosis (HCC) 03/11/2017   Bipolar 1 disorder, depressed, severe (HCC) 03/11/2017   Acute encephalopathy 11/28/2016   Stroke (cerebrum) (HCC)    Acute ischemic stroke (HCC) 06/24/2016   Bipolar disorder (HCC) 06/24/2016   Hypertension 06/24/2016   Hyperlipidemia 06/24/2016   Anxiety 06/24/2016   Facial droop    GERD without esophagitis    SORE THROAT 12/04/2008   VIRAL URI 03/04/2008   BLURRED VISION 10/22/2007   FATIGUE 10/22/2007   DISC DISEASE, LUMBAR 09/06/2007   COUGH 04/20/2007   Depression 11/19/2006   Asthma 11/19/2006   GERD 11/19/2006   HEADACHE 11/19/2006  HEPATITIS B, HX OF 11/19/2006    Past Surgical History:  Procedure Laterality Date   ANAL FISSURE REPAIR     FRACTURE SURGERY     LOOP RECORDER INSERTION N/A 06/26/2016   Procedure: Loop Recorder Insertion;  Surgeon: Hillis RangeJames Allred, MD;  Location: [facility] INVASIVE CV LAB;  Service: Cardiovascular;  Laterality: N/A;   MULTIPLE TOOTH EXTRACTIONS  2018   "I had 6 teeth pulled"   SHOULDER ARTHROSCOPY W/ ROTATOR CUFF REPAIR Right 2000   SHOULDER SURGERY Right 1999   TEE WITHOUT CARDIOVERSION N/A 06/26/2016   Procedure: TRANSESOPHAGEAL ECHOCARDIOGRAM (TEE);  Surgeon: Thurmon FairMihai Croitoru, MD;  [facility];  Service: Cardiovascular;  Laterality: N/A;     OB History   No obstetric history on  file.      Home Medications    Prior to Admission medications   Medication Sig Start Date End Date Taking? Authorizing Provider  albuterol (PROVENTIL HFA;VENTOLIN HFA) 108 (90 Base) MCG/ACT inhaler Inhale 2 puffs into the lungs every 6 (six) hours as needed for wheezing or shortness of breath.   Yes [provider]  albuterol (PROVENTIL) (2.5 MG/3ML) 0.083% nebulizer solution Take 3 mLs (2.5 mg total) by nebulization every 6 (six) hours as needed for wheezing or shortness of breath. 04/16/18  Yes Tyrone NineGrunz, Ryan B, MD  alprazolam Prudy Feeler(XANAX) 2 MG tablet Take 2 mg by mouth 3 (three) times daily.  02/18/18  Yes [provider]  aspirin EC 325 MG tablet Take 325 mg by mouth daily.   Yes [provider]  atenolol (TENORMIN) 50 MG tablet Take 100 mg by mouth at bedtime.  02/12/18  Yes [provider]  buPROPion (WELLBUTRIN XL) 150 MG 24 hr tablet Take 150 mg by mouth daily. 02/12/18  Yes [provider]  clomiPRAMINE (ANAFRANIL) 50 MG capsule Take 50 mg by mouth at bedtime. 02/12/18  Yes [provider]  CREON 36000 units CPEP capsule Take 36,000 Units by mouth 3 (three) times daily with meals.  10/20/16  Yes [provider]  dicyclomine (BENTYL) 10 MG capsule Take 10 mg by mouth 3 (three) times daily before meals.    Yes [provider]  FLUoxetine (PROZAC) 40 MG capsule Take 120 mg by mouth daily.  02/12/18  Yes [provider]  furosemide (LASIX) 20 MG tablet Take 40 mg by mouth 2 (two) times a day. Patient taking 2 tablets daily 09/30/18  Yes [provider]  gabapentin (NEURONTIN) 800 MG tablet Take 800 mg by mouth 4 (four) times daily.   Yes [provider]  hydrOXYzine (VISTARIL) 25 MG capsule Take 25 mg by mouth 4 (four) times daily. 02/12/18  Yes [provider]  Mirabegron (MYRBETRIQ PO) Take 1 tablet by mouth every evening.   Yes [provider]  mometasone-formoterol (DULERA) 200-5  MCG/ACT AERO Inhale 1 puff into the lungs 2 (two) times daily. 04/16/18  Yes Tyrone NineGrunz, Ryan B, MD  naproxen (NAPROSYN) 500 MG tablet Take 500 mg by mouth 2 (two) times daily. 02/12/18  Yes [provider]  oxybutynin (DITROPAN XL) 15 MG 24 hr tablet Take 15 mg by mouth at bedtime.  10/20/16  Yes [provider]  oxyCODONE-acetaminophen (PERCOCET) 10-325 MG tablet Take 1 tablet by mouth 3 (three) times daily. 02/18/18  Yes [provider]  pantoprazole (PROTONIX) 40 MG tablet Take 40 mg by mouth daily.  10/20/16  Yes [provider]  SAPHRIS 10 MG SUBL Take 10 mg by mouth See admin instructions. Place one  tablet (10 mg) under the tongue twice during the night 02/12/18  Yes [provider]  ciclopirox (PENLAC) 8 % solution Apply topically at bedtime. Apply over nail and surrounding skin. Apply daily over previous coat. After seven (7) days, may remove with alcohol and continue cycle. Patient not taking: Reported on 10/22/2018 02/26/18   Vivi BarrackWagoner, Matthew R, DPM    Family History Family History  Problem Relation Age of Onset   Hypertension Mother    Cancer Father        esophageal   Heart attack Maternal Grandfather     Social History Social History   Tobacco Use   Smoking status: Current Every Day Smoker    Packs/day: 0.25    Years: 25.00    Pack years: 6.25    Types: Cigarettes   Smokeless tobacco: Never Used  Substance Use Topics   Alcohol use: Yes    Alcohol/week: 1.0 standard drinks    Types: 1 Glasses of wine per week    Comment: "I drank one smironoff ice before I came) 12/04   Drug use: No     Allergies   Ampicillin, Cleocin [clindamycin hcl], Clonazepam, Other, and Penicillins   Review of Systems Review of Systems  Constitutional: Negative for fever.  Respiratory: Positive for cough (chronic, unchanged) and shortness of breath.   Cardiovascular: Positive for chest pain (earlier, none now) and leg swelling.    Gastrointestinal: Positive for abdominal pain and diarrhea. Negative for vomiting.  Genitourinary: Negative for dysuria.  Musculoskeletal: Negative for back pain.  Neurological: Positive for syncope and weakness. Negative for headaches.  All other systems reviewed and are negative.    Physical Exam Updated Vital Signs BP (!) 136/92    Pulse 65    Temp (!) 97.3 F (36.3 C) (Rectal)    Resp 16    Ht 5\' 7"  (1.702 m)    Wt 120.7 kg    LMP  (LMP Unknown)    SpO2 97%    BMI 41.66 kg/m   Physical Exam Vitals signs and nursing note reviewed.  Constitutional:      Appearance: She is well-developed. She is obese.  HENT:     Head: Normocephalic and atraumatic.     Right Ear: External ear normal.     Left Ear: External ear normal.     Nose: Nose normal.  Eyes:     General:        Right eye: No discharge.        Left eye: No discharge.  Cardiovascular:     Rate and Rhythm: Normal rate and regular rhythm.     Heart sounds: Normal heart sounds.  Pulmonary:     Effort: Pulmonary effort is normal.     Breath sounds: Normal breath sounds. No wheezing or rales.  Abdominal:     Palpations: Abdomen is soft.     Tenderness: There is no abdominal tenderness.  Musculoskeletal:     Right lower leg: Edema present.     Left lower leg: Edema present.     Comments: Trace pitting edema in BLE  Skin:    General: Skin is warm and dry.  Neurological:     Mental Status: She is alert.     Comments: CN 3-12 grossly intact. 5/5 strength in all 4 extremities. Grossly normal sensation. Normal finger to nose. Mildly slurred speech which she states is normal from prior stroke  Psychiatric:        Mood and Affect: Mood is not  anxious.      ED Treatments / Results  Labs (all labs ordered are listed, but only abnormal results are displayed) Labs Reviewed  COMPREHENSIVE METABOLIC PANEL - Abnormal; Notable for the following components:      Result Value   Glucose, Bld 109 (*)    Creatinine, Ser 1.05  (*)    Total Protein 6.4 (*)    Total Bilirubin <0.1 (*)    GFR calc non Af Amer 58 (*)    All other components within normal limits  CBC WITH DIFFERENTIAL/PLATELET - Abnormal; Notable for the following components:   Hemoglobin 11.5 (*)    MCH 24.7 (*)    MCHC 28.6 (*)    RDW 15.9 (*)    All other components within normal limits  URINALYSIS, ROUTINE W REFLEX MICROSCOPIC - Abnormal; Notable for the following components:   APPearance HAZY (*)    All other components within normal limits  CBG MONITORING, ED - Abnormal; Notable for the following components:   Glucose-Capillary 119 (*)    All other components within normal limits  URINE CULTURE  BRAIN NATRIURETIC PEPTIDE  LACTIC ACID, PLASMA  TROPONIN I (HIGH SENSITIVITY)  TROPONIN I (HIGH SENSITIVITY)    EKG EKG Interpretation  Date/Time:  Friday October 22 2018 10:11:57 EDT Ventricular Rate:  63 PR Interval:    QRS Duration: 109 QT Interval:  456 QTC Calculation: 467 R Axis:   -9 Text Interpretation:  Sinus rhythm Low voltage, precordial leads no acute ST/T changes no significant change since Jan 2020 Confirmed by Pricilla Loveless [phone]) on 10/22/2018 10:19:23 AM   Radiology Ct Head Wo Contrast  Result Date: 10/22/2018 CLINICAL DATA:  Altered level of consciousness EXAM: CT HEAD WITHOUT CONTRAST TECHNIQUE: Contiguous axial images were obtained from the base of the skull through the vertex without intravenous contrast. COMPARISON:  04/10/2018 FINDINGS: Brain: No evidence of acute infarction, hemorrhage, hydrocephalus, extra-axial collection or mass lesion/mass effect. Vascular: No hyperdense vessel or unexpected calcification. Skull: Normal. Negative for fracture or focal lesion. Sinuses/Orbits: No acute finding. Other: None. IMPRESSION: No acute intracranial abnormality noted. No change from the prior exam. Electronically Signed   By: Alcide Clever M.D.   On: 10/22/2018 11:48   Dg Chest Portable 1 View  Result Date:  10/22/2018 CLINICAL DATA:  Short of breath EXAM: PORTABLE CHEST 1 VIEW COMPARISON:  04/11/2018 FINDINGS: Heart size upper normal. Cardiac loop recorder unchanged. Negative for heart failure. Lungs are well aerated and clear IMPRESSION: No active disease. Electronically Signed   By: Marlan Palau M.D.   On: 10/22/2018 11:05    Procedures Procedures (including critical care time)  Medications Ordered in ED Medications  sodium chloride 0.9 % bolus 1,000 mL (0 mLs Intravenous Stopped 10/22/18 1400)     Initial Impression / Assessment and Plan / ED Course  I have reviewed the triage vital signs and the nursing notes.  Pertinent labs & imaging results that were available during my care of the patient were reviewed by me and considered in my medical decision making (see chart for details).        Patient presents with generalized weakness and a mild bump in her creatinine.  She has some extremity edema but no pulmonary edema and so I think she likely has venous insufficiency.  She has never had any obvious heart failure.  Thus with her taking Lasix I think she is now dehydrated, likely causing her overall weakness.  Unclear if she actually passed out or just fell  asleep as she did not wake up till the next morning.  I highly doubt an acute coronary event or arrhythmia.  After fluid she feels better and was able to ambulate at her baseline and wants to go home.  Discharged home with return precautions.  Final Clinical Impressions(s) / ED Diagnoses   Final diagnoses:  Dehydration    ED Discharge Orders    None       Sherwood Gambler, MD 10/22/18 1514

## 2018-10-22 NOTE — ED Notes (Signed)
Ambulated in room, around the bed & she did well with a walker.

## 2018-10-22 NOTE — ED Notes (Signed)
Pt CBG was 119, notified Paige(RN)

## 2018-10-22 NOTE — ED Triage Notes (Signed)
Pt has been self medicating with her prescribed lasix due to bilateral leg edema, last night she stated to have "blacked out" while she was sitting in her walker. States she has not felt well the past few days & feels weaker than usual, endorses some chest pain this morning, she appears sleepy but easily to stay aroused with eyes closing frequently.

## 2018-10-22 NOTE — ED Notes (Signed)
Patient transported to CT 

## 2018-10-23 LAB — URINE CULTURE

## 2019-04-21 ENCOUNTER — Ambulatory Visit (INDEPENDENT_AMBULATORY_CARE_PROVIDER_SITE_OTHER): Payer: Medicare Other | Admitting: Family Medicine

## 2019-05-05 ENCOUNTER — Ambulatory Visit (INDEPENDENT_AMBULATORY_CARE_PROVIDER_SITE_OTHER): Payer: Medicare Other | Admitting: Family Medicine

## 2019-06-03 ENCOUNTER — Observation Stay (HOSPITAL_COMMUNITY)
Admission: EM | Admit: 2019-06-03 | Discharge: 2019-06-05 | Disposition: A | Discharge status: Home / Self Care | Payer: Medicare Other | Attending: Internal Medicine | Admitting: Internal Medicine

## 2019-06-03 ENCOUNTER — Emergency Department (HOSPITAL_COMMUNITY): Payer: Medicare Other

## 2019-06-03 ENCOUNTER — Other Ambulatory Visit: Payer: Self-pay

## 2019-06-03 ENCOUNTER — Encounter (HOSPITAL_COMMUNITY): Payer: Self-pay | Admitting: *Deleted

## 2019-06-03 DIAGNOSIS — Z888 Allergy status to other drugs, medicaments and biological substances status: Secondary | ICD-10-CM | POA: Diagnosis not present

## 2019-06-03 DIAGNOSIS — I110 Hypertensive heart disease with heart failure: Secondary | ICD-10-CM | POA: Diagnosis not present

## 2019-06-03 DIAGNOSIS — W010XXA Fall on same level from slipping, tripping and stumbling without subsequent striking against object, initial encounter: Secondary | ICD-10-CM | POA: Diagnosis not present

## 2019-06-03 DIAGNOSIS — W19XXXA Unspecified fall, initial encounter: Secondary | ICD-10-CM

## 2019-06-03 DIAGNOSIS — M4802 Spinal stenosis, cervical region: Secondary | ICD-10-CM | POA: Insufficient documentation

## 2019-06-03 DIAGNOSIS — M1711 Unilateral primary osteoarthritis, right knee: Secondary | ICD-10-CM | POA: Insufficient documentation

## 2019-06-03 DIAGNOSIS — Z8673 Personal history of transient ischemic attack (TIA), and cerebral infarction without residual deficits: Secondary | ICD-10-CM | POA: Diagnosis not present

## 2019-06-03 DIAGNOSIS — K219 Gastro-esophageal reflux disease without esophagitis: Secondary | ICD-10-CM | POA: Diagnosis not present

## 2019-06-03 DIAGNOSIS — Z20822 Contact with and (suspected) exposure to covid-19: Secondary | ICD-10-CM | POA: Insufficient documentation

## 2019-06-03 DIAGNOSIS — E669 Obesity, unspecified: Secondary | ICD-10-CM | POA: Insufficient documentation

## 2019-06-03 DIAGNOSIS — T50901A Poisoning by unspecified drugs, medicaments and biological substances, accidental (unintentional), initial encounter: Secondary | ICD-10-CM

## 2019-06-03 DIAGNOSIS — S72002A Fracture of unspecified part of neck of left femur, initial encounter for closed fracture: Secondary | ICD-10-CM

## 2019-06-03 DIAGNOSIS — Z8249 Family history of ischemic heart disease and other diseases of the circulatory system: Secondary | ICD-10-CM | POA: Insufficient documentation

## 2019-06-03 DIAGNOSIS — E785 Hyperlipidemia, unspecified: Secondary | ICD-10-CM | POA: Diagnosis present

## 2019-06-03 DIAGNOSIS — B3789 Other sites of candidiasis: Secondary | ICD-10-CM

## 2019-06-03 DIAGNOSIS — F319 Bipolar disorder, unspecified: Secondary | ICD-10-CM | POA: Diagnosis not present

## 2019-06-03 DIAGNOSIS — J449 Chronic obstructive pulmonary disease, unspecified: Secondary | ICD-10-CM | POA: Insufficient documentation

## 2019-06-03 DIAGNOSIS — M797 Fibromyalgia: Secondary | ICD-10-CM | POA: Diagnosis not present

## 2019-06-03 DIAGNOSIS — Z01818 Encounter for other preprocedural examination: Secondary | ICD-10-CM | POA: Insufficient documentation

## 2019-06-03 DIAGNOSIS — I10 Essential (primary) hypertension: Secondary | ICD-10-CM | POA: Diagnosis present

## 2019-06-03 DIAGNOSIS — Z791 Long term (current) use of non-steroidal anti-inflammatories (NSAID): Secondary | ICD-10-CM | POA: Diagnosis not present

## 2019-06-03 DIAGNOSIS — Z7982 Long term (current) use of aspirin: Secondary | ICD-10-CM | POA: Insufficient documentation

## 2019-06-03 DIAGNOSIS — D649 Anemia, unspecified: Secondary | ICD-10-CM | POA: Insufficient documentation

## 2019-06-03 DIAGNOSIS — I5032 Chronic diastolic (congestive) heart failure: Secondary | ICD-10-CM | POA: Diagnosis not present

## 2019-06-03 DIAGNOSIS — M25552 Pain in left hip: Secondary | ICD-10-CM | POA: Diagnosis not present

## 2019-06-03 DIAGNOSIS — Z7951 Long term (current) use of inhaled steroids: Secondary | ICD-10-CM | POA: Diagnosis not present

## 2019-06-03 DIAGNOSIS — Z79899 Other long term (current) drug therapy: Secondary | ICD-10-CM | POA: Insufficient documentation

## 2019-06-03 DIAGNOSIS — G9340 Encephalopathy, unspecified: Principal | ICD-10-CM | POA: Diagnosis present

## 2019-06-03 DIAGNOSIS — M1612 Unilateral primary osteoarthritis, left hip: Secondary | ICD-10-CM | POA: Insufficient documentation

## 2019-06-03 DIAGNOSIS — G894 Chronic pain syndrome: Secondary | ICD-10-CM | POA: Diagnosis not present

## 2019-06-03 DIAGNOSIS — Z880 Allergy status to penicillin: Secondary | ICD-10-CM | POA: Diagnosis not present

## 2019-06-03 DIAGNOSIS — F419 Anxiety disorder, unspecified: Secondary | ICD-10-CM | POA: Insufficient documentation

## 2019-06-03 DIAGNOSIS — L89309 Pressure ulcer of unspecified buttock, unspecified stage: Secondary | ICD-10-CM

## 2019-06-03 DIAGNOSIS — Z881 Allergy status to other antibiotic agents status: Secondary | ICD-10-CM | POA: Insufficient documentation

## 2019-06-03 DIAGNOSIS — Z8711 Personal history of peptic ulcer disease: Secondary | ICD-10-CM | POA: Insufficient documentation

## 2019-06-03 DIAGNOSIS — E041 Nontoxic single thyroid nodule: Secondary | ICD-10-CM | POA: Insufficient documentation

## 2019-06-03 DIAGNOSIS — G43909 Migraine, unspecified, not intractable, without status migrainosus: Secondary | ICD-10-CM | POA: Insufficient documentation

## 2019-06-03 DIAGNOSIS — M2578 Osteophyte, vertebrae: Secondary | ICD-10-CM | POA: Insufficient documentation

## 2019-06-03 DIAGNOSIS — Z6841 Body Mass Index (BMI) 40.0 and over, adult: Secondary | ICD-10-CM | POA: Insufficient documentation

## 2019-06-03 DIAGNOSIS — F17210 Nicotine dependence, cigarettes, uncomplicated: Secondary | ICD-10-CM | POA: Insufficient documentation

## 2019-06-03 DIAGNOSIS — Z800 Family history of malignant neoplasm of digestive organs: Secondary | ICD-10-CM | POA: Insufficient documentation

## 2019-06-03 LAB — CBC WITH DIFFERENTIAL/PLATELET
Abs Immature Granulocytes: 0.04 10*3/uL (ref 0.00–0.07)
Basophils Absolute: 0.1 10*3/uL (ref 0.0–0.1)
Basophils Relative: 1 %
Eosinophils Absolute: 0.4 10*3/uL (ref 0.0–0.5)
Eosinophils Relative: 4 %
HCT: 38.7 % (ref 36.0–46.0)
Hemoglobin: 11.1 g/dL — ABNORMAL LOW (ref 12.0–15.0)
Immature Granulocytes: 1 %
Lymphocytes Relative: 33 %
Lymphs Abs: 2.9 10*3/uL (ref 0.7–4.0)
MCH: 24 pg — ABNORMAL LOW (ref 26.0–34.0)
MCHC: 28.7 g/dL — ABNORMAL LOW (ref 30.0–36.0)
MCV: 83.8 fL (ref 80.0–100.0)
Monocytes Absolute: 0.6 10*3/uL (ref 0.1–1.0)
Monocytes Relative: 7 %
Neutro Abs: 4.8 10*3/uL (ref 1.7–7.7)
Neutrophils Relative %: 54 %
Platelets: 272 10*3/uL (ref 150–400)
RBC: 4.62 MIL/uL (ref 3.87–5.11)
RDW: 15.7 % — ABNORMAL HIGH (ref 11.5–15.5)
WBC: 8.7 10*3/uL (ref 4.0–10.5)
nRBC: 0 % (ref 0.0–0.2)

## 2019-06-03 LAB — COMPREHENSIVE METABOLIC PANEL
ALT: 29 U/L (ref 0–44)
AST: 27 U/L (ref 15–41)
Albumin: 3.3 g/dL — ABNORMAL LOW (ref 3.5–5.0)
Alkaline Phosphatase: 94 U/L (ref 38–126)
Anion gap: 10 (ref 5–15)
BUN: 16 mg/dL (ref 6–20)
CO2: 24 mmol/L (ref 22–32)
Calcium: 8.8 mg/dL — ABNORMAL LOW (ref 8.9–10.3)
Chloride: 106 mmol/L (ref 98–111)
Creatinine, Ser: 0.68 mg/dL (ref 0.44–1.00)
GFR calc Af Amer: >60 mL/min (ref >60)
GFR calc non Af Amer: >60 mL/min (ref >60)
Glucose, Bld: 104 mg/dL — ABNORMAL HIGH (ref 70–99)
Potassium: 3.7 mmol/L (ref 3.5–5.1)
Sodium: 140 mmol/L (ref 135–145)
Total Bilirubin: 0.4 mg/dL (ref 0.3–1.2)
Total Protein: 6.1 g/dL — ABNORMAL LOW (ref 6.5–8.1)

## 2019-06-03 MED ORDER — SODIUM CHLORIDE 0.9% FLUSH: 3.0000 mL | Freq: Once | INTRAVENOUS | Status: DC

## 2019-06-03 MED ORDER — NYSTATIN 100000 UNIT/GM EX CREA
TOPICAL_CREAM | Freq: Two times a day (BID) | CUTANEOUS | Status: DC
  Filled 2019-06-03 (×2): qty 15

## 2019-06-03 NOTE — ED Notes (Signed)
The pt reports that she has had bed sores on her back for 5 weeks  She is c/o pain

## 2019-06-03 NOTE — ED Provider Notes (Signed)
[facility] EMERGENCY DEPARTMENT Provider Note   CSN: [id number] Arrival date & time: 06/03/19  1737     History Chief Complaint  Patient presents with  . Fall    hip pain  . Wound Check    Tammy Hines is a 59 y.o. female with a hx of arthritis (left hip), bipolar disorder, fibromyalgia, chronic pain, CVA, HTN presents to the Emergency Department complaining of acute, persistent, left hip pain after fall yesterday.  Pt reports she was reaching down to wet a washcloth when she lost her balance and fell striking her left side on the side of the tub.  She reports she was eventually able to get up but walking was excruciatingly painful and she was unable to sleep last night.   Pt also c/o wounds to her buttocks.  She reports she is prescribed lasix, but has stopped taking it due to consistently urinating on herself.  She reports her wounds are very painful. She also reports leg swelling.    Pt has significant difficulty staying awake during exam and answering questions.  Triage note states pt took Oxycodone 10mg  x3 tablets at 5pm.    Level 5 Caveat for AMS.    The history is provided by the patient and medical records. No language interpreter was used.       Past Medical History:  Diagnosis Date  . Anxiety   . Arthritis    "right knee, left hip, going down my neck into my right shoulder" (06/24/2016)  . Asthma   . Bipolar 1 disorder ([place])   . Chronic lower back pain   . Chronic neck pain   . COPD (chronic obstructive pulmonary disease) ([place])   . Depression    Tammy Hines 06/24/2016  . Family history of adverse reaction to anesthesia    "makes my mom throw up"   . Fibromyalgia   . GERD (gastroesophageal reflux disease)    Tammy Hines 06/24/2016  . Hepatitis B   . History of hiatal hernia   . History of stomach ulcers   . History of transient ischemic attack (TIA)    /RN 06/24/2016  . Hyperlipidemia   . Hypertension   . Ischemic stroke ([place])    acute/notes  06/24/2016; "left sided weakness"/RN (06/24/2016  . Migraine    "a few/year now" (06/24/2016)  . Obesity   . PONV (postoperative nausea and vomiting)     Patient Active Problem List   Diagnosis Date Noted  . Acute respiratory failure with hypoxia ([place]) 04/10/2018  . COPD with acute exacerbation ([place]) 04/10/2018  . Hammer toes of both feet 02/28/2018  . MDD (major depressive disorder), recurrent severe, without psychosis ([place]) 03/11/2017  . Bipolar 1 disorder, depressed, severe ([place]) 03/11/2017  . Acute encephalopathy 11/28/2016  . Stroke (cerebrum) ([place])   . Acute ischemic stroke ([place]) 06/24/2016  . Bipolar disorder ([place]) 06/24/2016  . Hypertension 06/24/2016  . Hyperlipidemia 06/24/2016  . Anxiety 06/24/2016  . Facial droop   . GERD without esophagitis   . SORE THROAT 12/04/2008  . VIRAL URI 03/04/2008  . BLURRED VISION 10/22/2007  . FATIGUE 10/22/2007  . [place] DISEASE, LUMBAR 09/06/2007  . COUGH 04/20/2007  . Depression 11/19/2006  . Asthma 11/19/2006  . GERD 11/19/2006  . HEADACHE 11/19/2006  . HEPATITIS B, HX OF 11/19/2006    Past Surgical History:  Procedure Laterality Date  . ANAL FISSURE REPAIR    . FRACTURE SURGERY    . LOOP RECORDER INSERTION N/A 06/26/2016  Procedure: Loop Recorder Insertion;  Surgeon: Hillis Range, MD;  Location: [facility] INVASIVE CV LAB;  Service: Cardiovascular;  Laterality: N/A;  . MULTIPLE TOOTH EXTRACTIONS  2018   "I had 6 teeth pulled"  . SHOULDER ARTHROSCOPY W/ ROTATOR CUFF REPAIR Right 2000  . SHOULDER SURGERY Right 1999  . TEE WITHOUT CARDIOVERSION N/A 06/26/2016   Procedure: TRANSESOPHAGEAL ECHOCARDIOGRAM (TEE);  Surgeon: Thurmon Fair, MD;  Location: [facility] ENDOSCOPY;  Service: Cardiovascular;  Laterality: N/A;     OB History   No obstetric history on file.     Family History  Problem Relation Age of Onset  . Hypertension Mother   . Cancer Father        esophageal  . Heart attack Maternal Grandfather     Social History    Tobacco Use  . Smoking status: Current Every Day Smoker    Packs/day: 0.25    Years: 25.00    Pack years: 6.25    Types: Cigarettes  . Smokeless tobacco: Never Used  Substance Use Topics  . Alcohol use: Yes    Alcohol/week: 1.0 standard drinks    Types: 1 Glasses of wine per week    Comment: "I drank one smironoff ice before I came) 12/04  . Drug use: No    Home Medications Prior to Admission medications   Medication Sig Start Date End Date Taking? Authorizing Provider  albuterol (PROVENTIL HFA;VENTOLIN HFA) 108 (90 Base) MCG/ACT inhaler Inhale 2 puffs into the lungs every 6 (six) hours as needed for wheezing or shortness of breath.   Yes [provider]  albuterol (PROVENTIL) (2.5 MG/3ML) 0.083% nebulizer solution Take 3 mLs (2.5 mg total) by nebulization every 6 (six) hours as needed for wheezing or shortness of breath. 04/16/18  Yes Tyrone Nine, MD  alprazolam Prudy Feeler) 2 MG tablet Take 2 mg by mouth 3 (three) times daily.  02/18/18  Yes [provider]  aspirin EC 325 MG tablet Take 325 mg by mouth daily.   Yes [provider]  atenolol (TENORMIN) 50 MG tablet Take 100 mg by mouth at bedtime.  02/12/18  Yes [provider]  buPROPion (WELLBUTRIN XL) 150 MG 24 hr tablet Take 150 mg by mouth daily. 02/12/18  Yes [provider]  clomiPRAMINE (ANAFRANIL) 50 MG capsule Take 50 mg by mouth at bedtime. 02/12/18  Yes [provider]  CREON 36000 units CPEP capsule Take 36,000 Units by mouth 3 (three) times daily with meals.  10/20/16  Yes [provider]  dicyclomine (BENTYL) 10 MG capsule Take 10 mg by mouth 3 (three) times daily before meals.    Yes [provider]  FLUoxetine (PROZAC) 40 MG capsule Take 120 mg by mouth daily.  02/12/18  Yes [provider]  furosemide (LASIX) 20 MG tablet Take 40 mg by mouth 2 (two) times a day. Patient taking 2 tablets daily 09/30/18  Yes [provider]  gabapentin  (NEURONTIN) 800 MG tablet Take 800 mg by mouth 4 (four) times daily.   Yes [provider]  hydrOXYzine (VISTARIL) 25 MG capsule Take 25 mg by mouth 4 (four) times daily. 02/12/18  Yes [provider]  Mirabegron (MYRBETRIQ PO) Take 1 tablet by mouth every evening.   Yes [provider]  mometasone-formoterol (DULERA) 200-5 MCG/ACT AERO Inhale 1 puff into the lungs 2 (two) times daily. 04/16/18  Yes Tyrone Nine, MD  naproxen (NAPROSYN) 500 MG tablet Take 500 mg by mouth 2 (two) times daily. 02/12/18  Yes [provider]  oxybutynin (DITROPAN XL) 15 MG 24 hr tablet Take 15 mg by mouth at bedtime.  10/20/16  Yes [provider]  oxyCODONE-acetaminophen (PERCOCET) 10-325 MG tablet Take 1 tablet by mouth 3 (three) times daily. 02/18/18  Yes [provider]  pantoprazole (PROTONIX) 40 MG tablet Take 40 mg by mouth daily.  10/20/16  Yes [provider]  SAPHRIS 10 MG SUBL Take 10 mg by mouth See admin instructions. Place one tablet (10 mg) under the tongue twice during the night 02/12/18  Yes [provider]  ciclopirox (PENLAC) 8 % solution Apply topically at bedtime. Apply over nail and surrounding skin. Apply daily over previous coat. After seven (7) days, may remove with alcohol and continue cycle. Patient not taking: Reported on 10/22/2018 02/26/18   Vivi BarrackWagoner, Matthew R, DPM    Allergies    Ampicillin, Cleocin [clindamycin hcl], Clonazepam, Other, and Penicillins  Review of Systems   Review of Systems  Unable to perform ROS: Mental status change  Musculoskeletal: Positive for arthralgias and gait problem.  Skin: Positive for wound.    Physical Exam Updated Vital Signs BP (!) 157/86   Pulse 78   Temp 98.2 F (36.8 C) (Oral)   Resp 16   Ht 5\' 7"  (1.702 m)   Wt 120.7 kg   LMP  (LMP Unknown)   SpO2 100%   BMI 41.68 kg/m   Physical Exam Vitals and nursing note reviewed.  Constitutional:      General: She is not in  acute distress.    Appearance: She is not diaphoretic.     Comments: Patient lethargic, difficult to arouse.  Once aroused she can answer some questions but immediately falls back to sleep.  HENT:     Head: Normocephalic.     Mouth/Throat:     Mouth: Mucous membranes are dry.  Eyes:     General: No scleral icterus.    Conjunctiva/sclera: Conjunctivae normal.     Pupils: Pupils are equal, round, and reactive to light.     Comments: No pinpoint pupils  Cardiovascular:     Rate and Rhythm: Normal rate and regular rhythm.     Pulses: Normal pulses.          Radial pulses are 2+ on the right side and 2+ on the left side.  Pulmonary:     Effort: No tachypnea, accessory muscle usage, prolonged expiration, respiratory distress or retractions.     Breath sounds: No stridor.     Comments: Equal chest rise. No increased work of breathing. Chest:    Abdominal:     General: There is no distension.     Palpations: Abdomen is soft.     Tenderness: There is no abdominal tenderness. There is no guarding or rebound.    Genitourinary:   Musculoskeletal:     Cervical back: Normal range of motion.     Right lower leg: 2+ Pitting Edema present.     Left lower leg: 2+ Pitting Edema present.     Comments: Moves all extremities equally and without difficulty.  Skin:    General: Skin is warm and dry.     Capillary Refill: Capillary refill takes less than 2 seconds.  Neurological:     Mental Status: She is lethargic.     GCS: GCS eye subscore is 3. GCS verbal subscore is 4. GCS motor subscore is 6.  Psychiatric:        Mood and Affect: Mood normal.  ED Results / Procedures / Treatments   Labs (all labs ordered are listed, but only abnormal results are displayed) Labs Reviewed  COMPREHENSIVE METABOLIC PANEL - Abnormal; Notable for the following components:      Result Value   Glucose, Bld 104 (*)    Calcium 8.8 (*)    Total Protein 6.1 (*)    Albumin 3.3 (*)    All other components  within normal limits  CBC WITH DIFFERENTIAL/PLATELET - Abnormal; Notable for the following components:   Hemoglobin 11.1 (*)    MCH 24.0 (*)    MCHC 28.7 (*)    RDW 15.7 (*)    All other components within normal limits  SARS CORONAVIRUS 2 (TAT 6-24 HRS)  URINALYSIS, ROUTINE W REFLEX MICROSCOPIC  RAPID URINE DRUG SCREEN, HOSP PERFORMED  ETHANOL    EKG EKG Interpretation  Date/Time:  Friday June 03 2019 23:12:21 EST Ventricular Rate:  69 PR Interval:    QRS Duration: 107 QT Interval:  465 QTC Calculation: 499 R Axis:   -30 Text Interpretation: Sinus rhythm Abnormal R-wave progression, late transition Inferior infarct, old Low voltage precordial leads When compared with ECG of 10/22/2018, Low voltage in precordial leads is now present Confirmed by Dione BoozeGlick, David ([id number]) on 06/03/2019 11:18:43 PM    Radiology DG Chest 2 View  Result Date: 06/03/2019 CLINICAL DATA:  Preoperative evaluation, fell EXAM: CHEST - 2 VIEW COMPARISON:  10/22/2018 FINDINGS: Frontal and lateral views of the chest are obtained. Loop recorder is seen within the anterior chest. Cardiac silhouette is enlarged, accentuated by portable AP technique. Mild central vascular congestion without superimposed airspace disease, effusion, or pneumothorax. IMPRESSION: 1. Chronic central vascular congestion.  No acute process. Electronically Signed   By: Sharlet SalinaMichael  Brown M.D.   On: 06/03/2019 23:08   CT Head Wo Contrast  Result Date: 06/04/2019 CLINICAL DATA:  Slip and fall EXAM: CT HEAD WITHOUT CONTRAST TECHNIQUE: Contiguous axial images were obtained from the base of the skull through the vertex without intravenous contrast. COMPARISON:  April 10, 2018 FINDINGS: Brain: No evidence of acute territorial infarction, hemorrhage, hydrocephalus,extra-axial collection or mass lesion/mass effect. Normal gray-white differentiation. Ventricles are normal in size and contour. Vascular: No hyperdense vessel or unexpected calcification.  Skull: The skull is intact. No fracture or focal lesion identified. Sinuses/Orbits: The visualized paranasal sinuses and mastoid air cells are clear. The orbits and globes intact. Other: None Cervical spine: Alignment: Again noted is a minimal anterolisthesis of C2 on C3 and C5 on C6. Skull base and vertebrae: Visualized skull base is intact. No atlanto-occipital dissociation. The vertebral body heights are well maintained. No fracture or pathologic osseous lesion seen. Soft tissues and spinal canal: The visualized paraspinal soft tissues are unremarkable. No prevertebral soft tissue swelling is seen. The spinal canal is grossly unremarkable, no large epidural collection or significant canal narrowing. Disc levels: Multilevel cervical spine spondylosis is seen with disc osteophyte complex and uncovertebral osteophytes. This is most notable at C5-C6 with moderate to severe bilateral neural foraminal narrowing and mild central canal stenosis. Upper chest: The lung apices are clear. Again noted is a heterogeneous nodule within the left thyroid lobe measuring 3 cm. Other: None IMPRESSION: 1. No acute intracranial abnormality. 2.  No acute fracture or malalignment of the spine. 3. Unchanged minimal anterolisthesis of C3 on C4 and C5 on C6. 4. Cervical spine spondylosis most notable at C5-C6. 5. Unchanged 3 cm left thyroid lobe nodule. Electronically Signed   By: Heywood BeneBindu  Avutu M.D.  On: 06/04/2019 00:40   CT Cervical Spine Wo Contrast  Result Date: 06/04/2019 CLINICAL DATA:  Slip and fall EXAM: CT HEAD WITHOUT CONTRAST TECHNIQUE: Contiguous axial images were obtained from the base of the skull through the vertex without intravenous contrast. COMPARISON:  April 10, 2018 FINDINGS: Brain: No evidence of acute territorial infarction, hemorrhage, hydrocephalus,extra-axial collection or mass lesion/mass effect. Normal gray-white differentiation. Ventricles are normal in size and contour. Vascular: No hyperdense vessel or  unexpected calcification. Skull: The skull is intact. No fracture or focal lesion identified. Sinuses/Orbits: The visualized paranasal sinuses and mastoid air cells are clear. The orbits and globes intact. Other: None Cervical spine: Alignment: Again noted is a minimal anterolisthesis of C2 on C3 and C5 on C6. Skull base and vertebrae: Visualized skull base is intact. No atlanto-occipital dissociation. The vertebral body heights are well maintained. No fracture or pathologic osseous lesion seen. Soft tissues and spinal canal: The visualized paraspinal soft tissues are unremarkable. No prevertebral soft tissue swelling is seen. The spinal canal is grossly unremarkable, no large epidural collection or significant canal narrowing. Disc levels: Multilevel cervical spine spondylosis is seen with disc osteophyte complex and uncovertebral osteophytes. This is most notable at C5-C6 with moderate to severe bilateral neural foraminal narrowing and mild central canal stenosis. Upper chest: The lung apices are clear. Again noted is a heterogeneous nodule within the left thyroid lobe measuring 3 cm. Other: None IMPRESSION: 1. No acute intracranial abnormality. 2.  No acute fracture or malalignment of the spine. 3. Unchanged minimal anterolisthesis of C3 on C4 and C5 on C6. 4. Cervical spine spondylosis most notable at C5-C6. 5. Unchanged 3 cm left thyroid lobe nodule. Electronically Signed   By: Jonna Clark M.D.   On: 06/04/2019 00:40   CT Hip Left Wo Contrast  Result Date: 06/04/2019 CLINICAL DATA:  Fall, left hip pain EXAM: CT OF THE LEFT HIP WITHOUT CONTRAST TECHNIQUE: Multidetector CT imaging of the left hip was performed according to the standard protocol. Multiplanar CT image reconstructions were also generated. COMPARISON:  06/03/2019, 08/02/2017 FINDINGS: Bones/Joint/Cartilage As seen on preceding x-ray, there is marked remodeling of the left femoral head with evidence of subchondral collapse likely reflecting  avascular necrosis. There is superior and lateral subluxation of the femoral head within the acetabulum. There is also sclerosis and remodeling of the left acetabulum. Numerous subchondral cysts are seen. There is a moderate left hip effusion with calcification of the left hip joint capsule. There are no acute displaced fractures. Visualized portions of the left hemipelvis are otherwise unremarkable. Ligaments Suboptimally assessed by CT. Muscles and Tendons No gross abnormalities. Soft tissues Visualized intraperitoneal structures are unremarkable. Aside from the left hip effusion, there are no discrete soft tissue abnormalities. Reconstructed images demonstrate no additional findings. IMPRESSION: 1. No acute displaced fracture. 2. Severe left hip arthropathy, with bony remodeling of the acetabulum and femoral head, superolateral subluxation of the femoral head, and moderate left hip effusion with capsular calcification. 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 Electronically Signed   By: Sharlet Salina M.D.   On: 06/04/2019 00:42   DG Hip Unilat With Pelvis 2-3 Views Left  Result Date: 06/03/2019 CLINICAL DATA:  Fall with hip pain EXAM: DG HIP (WITH OR WITHOUT PELVIS) 2-3V LEFT COMPARISON:  08/02/2017 FINDINGS: Grossly abnormal left hip with mild superolateral subluxation of the left femoral head. Widened appearance of the joint space with slight fragmentation of the superolateral acetabulum. Left femoral head deformity  and collapse. Possible acute nondisplaced fracture at the greater trochanter of the left hip. IMPRESSION: 1. Questionable nondisplaced fracture at the greater trochanter of the left femur 2. Interval abnormal appearance of left hip. Left femoral head deformity and collapse. Given widened appearance of joint space and associated acetabular changes, consider septic arthritis/osteomyelitis of indeterminate acuity. Avascular  necrosis due to other causes could also produce the changes at the left femoral head. Electronically Signed   By: Jasmine Pang M.D.   On: 06/03/2019 19:48    Procedures Procedures (including critical care time)  Medications Ordered in ED Medications  sodium chloride flush (NS) 0.9 % injection 3 mL (has no administration in time range)  nystatin cream (MYCOSTATIN) (has no administration in time range)    ED Course  I have reviewed the triage vital signs and the nursing notes.  Pertinent labs & imaging results that were available during my care of the patient were reviewed by me and considered in my medical decision making (see chart for details).  Clinical Course as of Jun 03 216  Fri Jun 03, 2019  2254 Discussed with Dr. Jena Gauss who will evaluate pain in the AM. Will order CT of the hip for further evaluation.   [HM]  Sat Jun 04, 2019  0217 Discussed with Dr. Toniann Fail who will admit   [HM]    Clinical Course User Index [HM] Telesha Deguzman, Boyd Kerbs   MDM Rules/Calculators/A&P                      Patient presents after fall.  She denies hitting her head but is somewhat altered today.  It appears she took 30 mg of oxycodone prior to arrival but she does not remember this.  Obtain CT head, UA and UDS for further evaluation.  Plain film of her left hip shows likely fracture of the greater trochanter.  Personally evaluated these images.  Additionally it shows collapse of the femoral head.  This appears to be new from previous image in April 2019.  Unclear if this is acute today or chronic.  Patient is under the care of Delbert Harness for chronic left hip pain and other osteoarthritis.  Discussed with Dr. Jena Gauss who will consult.  Will obtain CT left hip.  Patient afebrile.  She has multiple pressure ulcers to the buttock and thigh along with candidal rash to the groin.  No area of induration or purulent drainage to suggest acute infection.  Nystatin cream ordered.  Bilateral  peripheral edema with 2+ pitting.  Patient is not taking her Lasix.  Creatinine within normal limits.  Mild anemia at 11.1, this appears to be baseline.  Patient will need admission after work-up here in the emergency department is complete.  The patient was discussed with and seen by Dr. Jeraldine Loots who agrees with the treatment plan.  1:06 AM  CT scan of her head is without acute abnormality including no acute intracranial hemorrhage.  CT scan of the hip shows bony destruction but no specific acute fracture as was indicated on the plain films.  Patient continues to be somewhat altered and very sleepy.  UA pending.  Have concern about patient caring for herself at home with her overdose, inability to walk here in the emergency department, bedsores and acute hip pain.  She will be admitted for further evaluation and treatment.   Final Clinical Impression(s) / ED Diagnoses Final diagnoses:  Fall, initial encounter  Closed fracture of left hip, initial encounter (HCC)  Accidental  drug overdose, initial encounter  Candida rash of groin  Pressure injury of skin of buttock, unspecified injury stage, unspecified laterality    Rx / DC Orders ED Discharge Orders    None       Reta Norgren, Boyd Kerbs 06/04/19 [id number]    Gerhard Munch, MD 06/06/19 1734    Gerhard Munch, MD 06/06/19 1735

## 2019-06-03 NOTE — ED Triage Notes (Addendum)
PT states she is being tx for pressure ulcers on her buttocks by her pcp, but the cream and powder is not working.  She is incontinent and every time she urinates the pain to her wounds is excruciating.  Pt also states she slipped off the toilet last night and hit her L hip on the toilet.  Pt took 3 oxycodone 10's at 5pm and pain is still 8/10.

## 2019-06-04 ENCOUNTER — Emergency Department (HOSPITAL_COMMUNITY): Payer: Medicare Other

## 2019-06-04 ENCOUNTER — Encounter (HOSPITAL_COMMUNITY): Payer: Self-pay | Admitting: Internal Medicine

## 2019-06-04 DIAGNOSIS — T50901A Poisoning by unspecified drugs, medicaments and biological substances, accidental (unintentional), initial encounter: Secondary | ICD-10-CM | POA: Diagnosis not present

## 2019-06-04 DIAGNOSIS — W19XXXA Unspecified fall, initial encounter: Secondary | ICD-10-CM | POA: Diagnosis not present

## 2019-06-04 DIAGNOSIS — M25552 Pain in left hip: Secondary | ICD-10-CM | POA: Diagnosis present

## 2019-06-04 DIAGNOSIS — G9340 Encephalopathy, unspecified: Secondary | ICD-10-CM | POA: Diagnosis not present

## 2019-06-04 LAB — CBC WITH DIFFERENTIAL/PLATELET
Abs Immature Granulocytes: 0.02 10*3/uL (ref 0.00–0.07)
Basophils Absolute: 0.1 10*3/uL (ref 0.0–0.1)
Basophils Relative: 1 %
Eosinophils Absolute: 0.3 10*3/uL (ref 0.0–0.5)
Eosinophils Relative: 4 %
HCT: 37.9 % (ref 36.0–46.0)
Hemoglobin: 11 g/dL — ABNORMAL LOW (ref 12.0–15.0)
Immature Granulocytes: 0 %
Lymphocytes Relative: 29 %
Lymphs Abs: 2 10*3/uL (ref 0.7–4.0)
MCH: 24.2 pg — ABNORMAL LOW (ref 26.0–34.0)
MCHC: 29 g/dL — ABNORMAL LOW (ref 30.0–36.0)
MCV: 83.5 fL (ref 80.0–100.0)
Monocytes Absolute: 0.4 10*3/uL (ref 0.1–1.0)
Monocytes Relative: 7 %
Neutro Abs: 4.1 10*3/uL (ref 1.7–7.7)
Neutrophils Relative %: 59 %
Platelets: 228 10*3/uL (ref 150–400)
RBC: 4.54 MIL/uL (ref 3.87–5.11)
RDW: 15.7 % — ABNORMAL HIGH (ref 11.5–15.5)
WBC: 6.8 10*3/uL (ref 4.0–10.5)
nRBC: 0 % (ref 0.0–0.2)

## 2019-06-04 LAB — POCT I-STAT 7, (LYTES, BLD GAS, ICA,H+H)
Acid-Base Excess: 2 mmol/L (ref 0.0–2.0)
Bicarbonate: 27.9 mmol/L (ref 20.0–28.0)
Calcium, Ion: 1.25 mmol/L (ref 1.15–1.40)
HCT: 32 % — ABNORMAL LOW (ref 36.0–46.0)
Hemoglobin: 10.9 g/dL — ABNORMAL LOW (ref 12.0–15.0)
O2 Saturation: 90 %
Patient temperature: 98.2
Potassium: 3.6 mmol/L (ref 3.5–5.1)
Sodium: 142 mmol/L (ref 135–145)
TCO2: 29 mmol/L (ref 22–32)
pCO2 arterial: 45.7 mmHg (ref 32.0–48.0)
pH, Arterial: 7.393 (ref 7.350–7.450)
pO2, Arterial: 58 mmHg — ABNORMAL LOW (ref 83.0–108.0)

## 2019-06-04 LAB — RAPID URINE DRUG SCREEN, HOSP PERFORMED
Amphetamines: NOT DETECTED
Barbiturates: NOT DETECTED
Benzodiazepines: POSITIVE — AB
Cocaine: NOT DETECTED
Opiates: POSITIVE — AB
Tetrahydrocannabinol: NOT DETECTED

## 2019-06-04 LAB — HEPATIC FUNCTION PANEL
ALT: 29 U/L (ref 0–44)
AST: 30 U/L (ref 15–41)
Albumin: 3 g/dL — ABNORMAL LOW (ref 3.5–5.0)
Alkaline Phosphatase: 81 U/L (ref 38–126)
Bilirubin, Direct: <0.1 mg/dL (ref 0.0–0.2)
Total Bilirubin: 0.4 mg/dL (ref 0.3–1.2)
Total Protein: 5.8 g/dL — ABNORMAL LOW (ref 6.5–8.1)

## 2019-06-04 LAB — URINALYSIS, ROUTINE W REFLEX MICROSCOPIC
Bilirubin Urine: NEGATIVE
Glucose, UA: NEGATIVE mg/dL
Hgb urine dipstick: NEGATIVE
Ketones, ur: NEGATIVE mg/dL
Leukocytes,Ua: NEGATIVE
Nitrite: NEGATIVE
Protein, ur: NEGATIVE mg/dL
Specific Gravity, Urine: 1.018 (ref 1.005–1.030)
pH: 6 (ref 5.0–8.0)

## 2019-06-04 LAB — BASIC METABOLIC PANEL
Anion gap: 8 (ref 5–15)
BUN: 14 mg/dL (ref 6–20)
CO2: 26 mmol/L (ref 22–32)
Calcium: 8.7 mg/dL — ABNORMAL LOW (ref 8.9–10.3)
Chloride: 106 mmol/L (ref 98–111)
Creatinine, Ser: 0.68 mg/dL (ref 0.44–1.00)
GFR calc Af Amer: >60 mL/min (ref >60)
GFR calc non Af Amer: >60 mL/min (ref >60)
Glucose, Bld: 92 mg/dL (ref 70–99)
Potassium: 3.4 mmol/L — ABNORMAL LOW (ref 3.5–5.1)
Sodium: 140 mmol/L (ref 135–145)

## 2019-06-04 LAB — SARS CORONAVIRUS 2 (TAT 6-24 HRS): SARS Coronavirus 2: NEGATIVE

## 2019-06-04 LAB — AMMONIA: Ammonia: 27 umol/L (ref 9–35)

## 2019-06-04 LAB — HIV ANTIBODY (ROUTINE TESTING W REFLEX): HIV Screen 4th Generation wRfx: NONREACTIVE

## 2019-06-04 LAB — ETHANOL: Alcohol, Ethyl (B): <10 mg/dL (ref <10)

## 2019-06-04 LAB — ACETAMINOPHEN LEVEL: Acetaminophen (Tylenol), Serum: 17 ug/mL (ref 10–30)

## 2019-06-04 LAB — SALICYLATE LEVEL: Salicylate Lvl: <7 mg/dL — ABNORMAL LOW (ref 7.0–30.0)

## 2019-06-04 MED ORDER — CLOMIPRAMINE HCL 25 MG PO CAPS
50.0000 mg | ORAL_CAPSULE | Freq: Every day | ORAL | Status: DC
  Administered 2019-06-04: 50 mg via ORAL
  Filled 2019-06-04 (×2): qty 2

## 2019-06-04 MED ORDER — ACETAMINOPHEN 650 MG RE SUPP: 650.0000 mg | Freq: Four times a day (QID) | RECTAL | Status: DC | PRN

## 2019-06-04 MED ORDER — OXYBUTYNIN CHLORIDE ER 15 MG PO TB24
15.0000 mg | ORAL_TABLET | Freq: Every day | ORAL | Status: DC
  Administered 2019-06-04: 15 mg via ORAL
  Filled 2019-06-04 (×2): qty 1

## 2019-06-04 MED ORDER — BUPROPION HCL ER (XL) 150 MG PO TB24
150.0000 mg | ORAL_TABLET | Freq: Every day | ORAL | Status: DC
  Administered 2019-06-04 – 2019-06-05 (×2): 150 mg via ORAL
  Filled 2019-06-04 (×2): qty 1

## 2019-06-04 MED ORDER — ACETAMINOPHEN 325 MG PO TABS
650.0000 mg | ORAL_TABLET | Freq: Four times a day (QID) | ORAL | Status: DC | PRN
  Administered 2019-06-04 – 2019-06-05 (×2): 650 mg via ORAL
  Filled 2019-06-04 (×2): qty 2

## 2019-06-04 MED ORDER — MOMETASONE FURO-FORMOTEROL FUM 200-5 MCG/ACT IN AERO
1.0000 | INHALATION_SPRAY | Freq: Two times a day (BID) | RESPIRATORY_TRACT | Status: DC
  Administered 2019-06-04 – 2019-06-05 (×3): 1 via RESPIRATORY_TRACT
  Filled 2019-06-04: qty 8.8

## 2019-06-04 MED ORDER — POTASSIUM CHLORIDE CRYS ER 20 MEQ PO TBCR
40.0000 meq | EXTENDED_RELEASE_TABLET | Freq: Once | ORAL | Status: AC
  Administered 2019-06-04: 40 meq via ORAL
  Filled 2019-06-04: qty 2

## 2019-06-04 MED ORDER — FUROSEMIDE 40 MG PO TABS
40.0000 mg | ORAL_TABLET | Freq: Two times a day (BID) | ORAL | Status: DC
  Administered 2019-06-05: 40 mg via ORAL
  Filled 2019-06-04: qty 1

## 2019-06-04 MED ORDER — ONDANSETRON HCL 4 MG/2ML IJ SOLN: 4.0000 mg | Freq: Four times a day (QID) | INTRAMUSCULAR | Status: DC | PRN

## 2019-06-04 MED ORDER — PANTOPRAZOLE SODIUM 40 MG PO TBEC
40.0000 mg | DELAYED_RELEASE_TABLET | Freq: Every day | ORAL | Status: DC
  Administered 2019-06-04 – 2019-06-05 (×2): 40 mg via ORAL
  Filled 2019-06-04 (×2): qty 1

## 2019-06-04 MED ORDER — FLUOXETINE HCL 20 MG PO CAPS
120.0000 mg | ORAL_CAPSULE | Freq: Every day | ORAL | Status: DC
  Administered 2019-06-04 – 2019-06-05 (×2): 120 mg via ORAL
  Filled 2019-06-04 (×3): qty 6

## 2019-06-04 MED ORDER — ONDANSETRON HCL 4 MG PO TABS: 4.0000 mg | ORAL_TABLET | Freq: Four times a day (QID) | ORAL | Status: DC | PRN

## 2019-06-04 MED ORDER — ALBUTEROL SULFATE (2.5 MG/3ML) 0.083% IN NEBU: 2.5000 mg | INHALATION_SOLUTION | Freq: Four times a day (QID) | RESPIRATORY_TRACT | Status: DC | PRN

## 2019-06-04 MED ORDER — ATENOLOL 50 MG PO TABS
100.0000 mg | ORAL_TABLET | Freq: Every day | ORAL | Status: DC
  Administered 2019-06-04: 100 mg via ORAL
  Filled 2019-06-04: qty 2

## 2019-06-04 MED ORDER — LIDOCAINE 5 % EX PTCH
1.0000 | MEDICATED_PATCH | CUTANEOUS | Status: DC
  Administered 2019-06-04: 1 via TRANSDERMAL
  Filled 2019-06-04 (×2): qty 1

## 2019-06-04 MED ORDER — PANCRELIPASE (LIP-PROT-AMYL) 36000-114000 UNITS PO CPEP
36000.0000 [IU] | ORAL_CAPSULE | Freq: Three times a day (TID) | ORAL | Status: DC
  Administered 2019-06-04 – 2019-06-05 (×4): 36000 [IU] via ORAL
  Filled 2019-06-04 (×8): qty 1

## 2019-06-04 MED ORDER — DICYCLOMINE HCL 10 MG PO CAPS
10.0000 mg | ORAL_CAPSULE | Freq: Three times a day (TID) | ORAL | Status: DC
  Administered 2019-06-04 – 2019-06-05 (×4): 10 mg via ORAL
  Filled 2019-06-04 (×5): qty 1

## 2019-06-04 NOTE — ED Notes (Signed)
Breakfast ordered 

## 2019-06-04 NOTE — Progress Notes (Signed)
Patient is a 59 year old female with history of COPD, depression, anxiety, chronic pain syndrome, hypertension, fibromyalgia, diastolic CHF who presented with a fall at home.  She lives in a trailer.  She was complaining of left hip pain which is chronic.  CT of the left hip shows chronic changes with subluxation of the femoral head.  Orthopedics was consulted, no intervention recommended.  She became lethargic and confused at the emergency department but during my evaluation she was alert and oriented.  Chest x-ray showed chronic vascular congestion. She has history of COPD and currently she is not in exacerbation.  She has chronic bilateral lower extremity edema and takes Lasix at home. Patient also complains of pain on the buttocks and examination revealed a stage I-II shallow pressure ulcers on the buttocks.  Wound care has been consulted. We have requested for PT/OT evaluation.  Hopefully she can be discharged home tomorrow. Patient seen by my colleague Dr. Toniann Fail this morning.

## 2019-06-04 NOTE — H&P (Addendum)
History and Physical    Tammy Hines ZOX:[id number] DOB: 04/27/1960 DOA: 06/03/2019  PCP: Quitman Livings, MD  Patient coming from: Home.  Chief Complaint: Left hip pain.  Most of the history was obtained from the ER physician as patient is encephalopathic unable to reach patient's nephew with the number provided.  HPI: Tammy Hines is a 59 y.o. female with history of COPD, depression anxiety chronic pain hypertension fibromyalgia had a fall at home and has been hurting on her left hip since then.  Patient took more than her usual amount of oxycodone per report around 30 mg and was brought to the ER.  Patient also has been having some sores on the back for last few weeks.  ED Course: In the ER patient initially was awake alert awake and stating her complaints of having left hip pain after a fall which she had when she slipped.  CT head and C-spine was unremarkable.  CT of the left hip shows chronic changes with subluxation of the femoral head.  Dr. Jena Gauss on-call orthopedic surgeon was consulted by the ER physician.  Will be seeing patient in consult.  Patient then became progressively more lethargic and confused.  Easily arousable pupils not constricted.  Maintaining sats well and protecting airways.  Will admit for further observation.  Labs show albumin 3.3 hemoglobin 11.1 EKG normal sinus rhythm Covid test pending and chest x-ray chronic vascular congestion.  Alcohol level was undetectable.  Tylenol and salicylate levels are pending.  ABG does not show any hypercapnia.  Mild hypoxia.  Review of Systems: As per HPI, rest all negative.   Past Medical History:  Diagnosis Date  . Anxiety   . Arthritis    "right knee, left hip, going down my neck into my right shoulder" (06/24/2016)  . Asthma   . Bipolar 1 disorder (HCC)   . Chronic lower back pain   . Chronic neck pain   . COPD (chronic obstructive pulmonary disease) (HCC)   . Depression    Tammy Hines 06/24/2016  . Family history of  adverse reaction to anesthesia    "makes my mom throw up"   . Fibromyalgia   . GERD (gastroesophageal reflux disease)    Tammy Hines 06/24/2016  . Hepatitis B   . History of hiatal hernia   . History of stomach ulcers   . History of transient ischemic attack (TIA)    /RN 06/24/2016  . Hyperlipidemia   . Hypertension   . Ischemic stroke (HCC)    acute/notes 06/24/2016; "left sided weakness"/RN (06/24/2016  . Migraine    "a few/year now" (06/24/2016)  . Obesity   . PONV (postoperative nausea and vomiting)     Past Surgical History:  Procedure Laterality Date  . ANAL FISSURE REPAIR    . FRACTURE SURGERY    . LOOP RECORDER INSERTION N/A 06/26/2016   Procedure: Loop Recorder Insertion;  Surgeon: Hillis Range, MD;  Location: [facility]VASIVE CV [facility];  Service: Cardiovascular;  Laterality: N/A;  . MULTIPLE TOOTH EXTRACTIONS  2018   "I had 6 teeth pulled"  . SHOULDER ARTHROSCOPY W/ ROTATOR CUFF REPAIR Right 2000  . SHOULDER SURGERY Right 1999  . TEE WITHOUT CARDIOVERSION N/A 06/26/2016   Procedure: TRANSESOPHAGEAL ECHOCARDIOGRAM (TEE);  Surgeon: Thurmon Fair, MD;  Location: [facility] ENDOSCOPY;  Service: Cardiovascular;  Laterality: N/A;     reports that she has been smoking cigarettes. She has a 6.25 pack-year smoking history. She has never used smokeless tobacco. She reports current alcohol use of  about 1.0 standard drinks of alcohol per week. She reports that she does not use drugs.  Allergies  Allergen Reactions  . Ampicillin Hives and Nausea And Vomiting    Has patient had a PCN reaction causing immediate rash, facial/tongue/throat swelling, SOB or lightheadedness with hypotension: No Has patient had a PCN reaction causing severe rash involving mucus membranes or skin necrosis: No Has patient had a PCN reaction that required hospitalization No Has patient had a PCN reaction occurring within the last 10 years: No If all of the above answers are "NO", then may proceed with Cephalosporin use.   Marland Kitchen  Cleocin [Clindamycin Hcl] Other (See Comments)    "deathly sick"  . Clonazepam Other (See Comments)    Unknown reaction  . Other Other (See Comments)    Allergy to mold, down and feathers per allergy test  . Penicillins Other (See Comments)    Has patient had a PCN reaction causing immediate rash, facial/tongue/throat swelling, SOB or lightheadedness with hypotension: No Has patient had a PCN reaction causing severe rash involving mucus membranes or skin necrosis: No Has patient had a PCN reaction that required hospitalization No Has patient had a PCN reaction occurring within the last 10 years: No If all of the above answers are "NO", then may proceed with Cephalosporin use.   Pt told not to take because of reaction to ampicillin - no    Family History  Problem Relation Age of Onset  . Hypertension Mother   . Cancer Father        esophageal  . Heart attack Maternal Grandfather     Prior to Admission medications   Medication Sig Start Date End Date Taking? Authorizing Provider  albuterol (PROVENTIL HFA;VENTOLIN HFA) 108 (90 Base) MCG/ACT inhaler Inhale 2 puffs into the lungs every 6 (six) hours as needed for wheezing or shortness of breath.   Yes [provider]  albuterol (PROVENTIL) (2.5 MG/3ML) 0.083% nebulizer solution Take 3 mLs (2.5 mg total) by nebulization every 6 (six) hours as needed for wheezing or shortness of breath. 04/16/18  Yes Tyrone Nine, MD  alprazolam Prudy Feeler) 2 MG tablet Take 2 mg by mouth 3 (three) times daily.  02/18/18  Yes [provider]  aspirin EC 325 MG tablet Take 325 mg by mouth daily.   Yes [provider]  atenolol (TENORMIN) 50 MG tablet Take 100 mg by mouth at bedtime.  02/12/18  Yes [provider]  buPROPion (WELLBUTRIN XL) 150 MG 24 hr tablet Take 150 mg by mouth daily. 02/12/18  Yes [provider]  clomiPRAMINE (ANAFRANIL) 50 MG capsule Take 50 mg by mouth at bedtime. 02/12/18  Yes [provider]  CREON 36000 units CPEP capsule Take 36,000 Units by mouth 3 (three) times daily with meals.  10/20/16  Yes [provider]  dicyclomine (BENTYL) 10 MG capsule Take 10 mg by mouth 3 (three) times daily before meals.    Yes [provider]  FLUoxetine (PROZAC) 40 MG capsule Take 120 mg by mouth daily.  02/12/18  Yes [provider]  furosemide (LASIX) 20 MG tablet Take 40 mg by mouth 2 (two) times a day. Patient taking 2 tablets daily 09/30/18  Yes [provider]  gabapentin (NEURONTIN) 800 MG tablet Take 800 mg by mouth 4 (four) times daily.   Yes [provider]  hydrOXYzine (VISTARIL) 25 MG capsule Take 25 mg by mouth 4 (four) times daily. 02/12/18  Yes [provider]  Mirabegron (  MYRBETRIQ PO) Take 1 tablet by mouth every evening.   Yes [provider]  mometasone-formoterol (DULERA) 200-5 MCG/ACT AERO Inhale 1 puff into the lungs 2 (two) times daily. 04/16/18  Yes Tyrone Nine, MD  naproxen (NAPROSYN) 500 MG tablet Take 500 mg by mouth 2 (two) times daily. 02/12/18  Yes [provider]  oxybutynin (DITROPAN XL) 15 MG 24 hr tablet Take 15 mg by mouth at bedtime.  10/20/16  Yes [provider]  oxyCODONE-acetaminophen (PERCOCET) 10-325 MG tablet Take 1 tablet by mouth 3 (three) times daily. 02/18/18  Yes [provider]  pantoprazole (PROTONIX) 40 MG tablet Take 40 mg by mouth daily.  10/20/16  Yes [provider]  SAPHRIS 10 MG SUBL Take 10 mg by mouth See admin instructions. Place one tablet (10 mg) under the tongue twice during the night 02/12/18  Yes [provider]  ciclopirox (PENLAC) 8 % solution Apply topically at bedtime. Apply over nail and surrounding skin. Apply daily over previous coat. After seven (7) days, may remove with alcohol and continue cycle. Patient not taking: Reported on 10/22/2018 02/26/18   Vivi Barrack, DPM    Physical Exam: Constitutional: Moderately built  and nourished. Vitals:   06/04/19 0330 06/04/19 0400 06/04/19 0430 06/04/19 0445  BP: 133/77 126/78 131/84   Pulse: 81 73 72 82  Resp: 17 14 14 16   Temp:      TempSrc:      SpO2: 96%   95%  Weight:      Height:       Eyes: Anicteric no pallor. ENMT: No discharge from the ears eyes nose or mouth. Neck: No mass felt.  No neck rigidity. Respiratory: No rhonchi or crepitations. Cardiovascular: S1-S2 heard. Abdomen: Soft nontender bowel sounds present. Musculoskeletal: No edema. Skin: Per report there was sores on the back sacral area. Neurologic: Patient is lethargic arousable oriented to name moving all extremities pupils are not constricted but reactive to light. Psychiatric: Confused.   Labs on Admission: I have personally reviewed following labs and imaging studies  CBC: Recent Labs  Lab 06/03/19 1810 06/04/19 0443  WBC 8.7  --   NEUTROABS 4.8  --   HGB 11.1* 10.9*  HCT 38.7 32.0*  MCV 83.8  --   PLT 272  --    Basic Metabolic Panel: Recent Labs  Lab 06/03/19 1810 06/04/19 0443  NA 140 142  K 3.7 3.6  CL 106  --   CO2 24  --   GLUCOSE 104*  --   BUN 16  --   CREATININE 0.68  --   CALCIUM 8.8*  --    GFR: Estimated Creatinine Clearance: 101.8 mL/min (by C-G formula based on SCr of 0.68 mg/dL). Liver Function Tests: Recent Labs  Lab 06/03/19 1810  AST 27  ALT 29  ALKPHOS 94  BILITOT 0.4  PROT 6.1*  ALBUMIN 3.3*   No results for input(s): LIPASE, AMYLASE in the last 168 hours. No results for input(s): AMMONIA in the last 168 hours. Coagulation Profile: No results for input(s): INR, PROTIME in the last 168 hours. Cardiac Enzymes: No results for input(s): CKTOTAL, CKMB, CKMBINDEX, TROPONINI in the last 168 hours. BNP (last 3 results) No results for input(s): PROBNP in the last 8760 hours. HbA1C: No results for input(s): HGBA1C in the last 72 hours. CBG: No results for input(s): GLUCAP in the last 168 hours. Lipid Profile: No results for  input(s): CHOL, HDL, LDLCALC, TRIG, CHOLHDL, LDLDIRECT in the  last 72 hours. Thyroid Function Tests: No results for input(s): TSH, T4TOTAL, FREET4, T3FREE, THYROIDAB in the last 72 hours. Anemia Panel: No results for input(s): VITAMINB12, FOLATE, FERRITIN, TIBC, IRON, RETICCTPCT in the last 72 hours. Urine analysis:    Component Value Date/Time   COLORURINE YELLOW 10/22/2018 1051   APPEARANCEUR HAZY (A) 10/22/2018 1051   LABSPEC 1.009 10/22/2018 1051   PHURINE 5.0 10/22/2018 1051   GLUCOSEU NEGATIVE 10/22/2018 1051   HGBUR NEGATIVE 10/22/2018 1051   BILIRUBINUR NEGATIVE 10/22/2018 1051   KETONESUR NEGATIVE 10/22/2018 1051   PROTEINUR NEGATIVE 10/22/2018 1051   NITRITE NEGATIVE 10/22/2018 1051   LEUKOCYTESUR NEGATIVE 10/22/2018 1051   Sepsis Labs: @LABRCNTIP (procalcitonin:4,lacticidven:4) )No results found for this or any previous visit (from the past 240 hour(s)).   Radiological Exams on Admission: DG Chest 2 View  Result Date: 06/03/2019 CLINICAL DATA:  Preoperative evaluation, fell EXAM: CHEST - 2 VIEW COMPARISON:  10/22/2018 FINDINGS: Frontal and lateral views of the chest are obtained. Loop recorder is seen within the anterior chest. Cardiac silhouette is enlarged, accentuated by portable AP technique. Mild central vascular congestion without superimposed airspace disease, effusion, or pneumothorax. IMPRESSION: 1. Chronic central vascular congestion.  No acute process. Electronically Signed   By: Randa Ngo M.D.   On: 06/03/2019 23:08   CT Head Wo Contrast  Result Date: 06/04/2019 CLINICAL DATA:  Slip and fall EXAM: CT HEAD WITHOUT CONTRAST TECHNIQUE: Contiguous axial images were obtained from the base of the skull through the vertex without intravenous contrast. COMPARISON:  April 10, 2018 FINDINGS: Brain: No evidence of acute territorial infarction, hemorrhage, hydrocephalus,extra-axial collection or mass lesion/mass effect. Normal gray-white differentiation. Ventricles are  normal in size and contour. Vascular: No hyperdense vessel or unexpected calcification. Skull: The skull is intact. No fracture or focal lesion identified. Sinuses/Orbits: The visualized paranasal sinuses and mastoid air cells are clear. The orbits and globes intact. Other: None Cervical spine: Alignment: Again noted is a minimal anterolisthesis of C2 on C3 and C5 on C6. Skull base and vertebrae: Visualized skull base is intact. No atlanto-occipital dissociation. The vertebral body heights are well maintained. No fracture or pathologic osseous lesion seen. Soft tissues and spinal canal: The visualized paraspinal soft tissues are unremarkable. No prevertebral soft tissue swelling is seen. The spinal canal is grossly unremarkable, no large epidural collection or significant canal narrowing. Disc levels: Multilevel cervical spine spondylosis is seen with disc osteophyte complex and uncovertebral osteophytes. This is most notable at C5-C6 with moderate to severe bilateral neural foraminal narrowing and mild central canal stenosis. Upper chest: The lung apices are clear. Again noted is a heterogeneous nodule within the left thyroid lobe measuring 3 cm. Other: None IMPRESSION: 1. No acute intracranial abnormality. 2.  No acute fracture or malalignment of the spine. 3. Unchanged minimal anterolisthesis of C3 on C4 and C5 on C6. 4. Cervical spine spondylosis most notable at C5-C6. 5. Unchanged 3 cm left thyroid lobe nodule. Electronically Signed   By: Prudencio Pair M.D.   On: 06/04/2019 00:40   CT Cervical Spine Wo Contrast  Result Date: 06/04/2019 CLINICAL DATA:  Slip and fall EXAM: CT HEAD WITHOUT CONTRAST TECHNIQUE: Contiguous axial images were obtained from the base of the skull through the vertex without intravenous contrast. COMPARISON:  April 10, 2018 FINDINGS: Brain: No evidence of acute territorial infarction, hemorrhage, hydrocephalus,extra-axial collection or mass lesion/mass effect. Normal gray-white  differentiation. Ventricles are normal in size and contour. Vascular: No hyperdense vessel or unexpected calcification. Skull: The skull is  intact. No fracture or focal lesion identified. Sinuses/Orbits: The visualized paranasal sinuses and mastoid air cells are clear. The orbits and globes intact. Other: None Cervical spine: Alignment: Again noted is a minimal anterolisthesis of C2 on C3 and C5 on C6. Skull base and vertebrae: Visualized skull base is intact. No atlanto-occipital dissociation. The vertebral body heights are well maintained. No fracture or pathologic osseous lesion seen. Soft tissues and spinal canal: The visualized paraspinal soft tissues are unremarkable. No prevertebral soft tissue swelling is seen. The spinal canal is grossly unremarkable, no large epidural collection or significant canal narrowing. Disc levels: Multilevel cervical spine spondylosis is seen with disc osteophyte complex and uncovertebral osteophytes. This is most notable at C5-C6 with moderate to severe bilateral neural foraminal narrowing and mild central canal stenosis. Upper chest: The lung apices are clear. Again noted is a heterogeneous nodule within the left thyroid lobe measuring 3 cm. Other: None IMPRESSION: 1. No acute intracranial abnormality. 2.  No acute fracture or malalignment of the spine. 3. Unchanged minimal anterolisthesis of C3 on C4 and C5 on C6. 4. Cervical spine spondylosis most notable at C5-C6. 5. Unchanged 3 cm left thyroid lobe nodule. Electronically Signed   By: Jonna ClarkBindu  Avutu M.D.   On: 06/04/2019 00:40   CT Hip Left Wo Contrast  Result Date: 06/04/2019 CLINICAL DATA:  Fall, left hip pain EXAM: CT OF THE LEFT HIP WITHOUT CONTRAST TECHNIQUE: Multidetector CT imaging of the left hip was performed according to the standard protocol. Multiplanar CT image reconstructions were also generated. COMPARISON:  06/03/2019, 08/02/2017 FINDINGS: Bones/Joint/Cartilage As seen on preceding x-ray, there is marked  remodeling of the left femoral head with evidence of subchondral collapse likely reflecting avascular necrosis. There is superior and lateral subluxation of the femoral head within the acetabulum. There is also sclerosis and remodeling of the left acetabulum. Numerous subchondral cysts are seen. There is a moderate left hip effusion with calcification of the left hip joint capsule. There are no acute displaced fractures. Visualized portions of the left hemipelvis are otherwise unremarkable. Ligaments Suboptimally assessed by CT. Muscles and Tendons No gross abnormalities. Soft tissues Visualized intraperitoneal structures are unremarkable. Aside from the left hip effusion, there are no discrete soft tissue abnormalities. Reconstructed images demonstrate no additional findings. IMPRESSION: 1. No acute displaced fracture. 2. Severe left hip arthropathy, with bony remodeling of the acetabulum and femoral head, superolateral subluxation of the femoral head, and moderate left hip effusion with capsular calcification. 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 Electronically Signed   By: Sharlet SalinaMichael  Brown M.D.   On: 06/04/2019 00:42   DG Hip Unilat With Pelvis 2-3 Views Left  Result Date: 06/03/2019 CLINICAL DATA:  Fall with hip pain EXAM: DG HIP (WITH OR WITHOUT PELVIS) 2-3V LEFT COMPARISON:  08/02/2017 FINDINGS: Grossly abnormal left hip with mild superolateral subluxation of the left femoral head. Widened appearance of the joint space with slight fragmentation of the superolateral acetabulum. Left femoral head deformity and collapse. Possible acute nondisplaced fracture at the greater trochanter of the left hip. IMPRESSION: 1. Questionable nondisplaced fracture at the greater trochanter of the left femur 2. Interval abnormal appearance of left hip. Left femoral head deformity and collapse. Given widened appearance of joint space and associated  acetabular changes, consider septic arthritis/osteomyelitis of indeterminate acuity. Avascular necrosis due to other causes could also produce the changes at the left femoral head. Electronically Signed   By: Jasmine PangKim  Fujinaga M.D.   On:  06/03/2019 19:48    EKG: Independently reviewed.  Normal sinus rhythm.  Assessment/Plan Principal Problem:   Acute encephalopathy Active Problems:   Bipolar disorder (HCC)   Hypertension   Hyperlipidemia   Left hip pain    1. Acute encephalopathy -patient did take more than usual dose of oxycodone which likely could be contributing to her confusion ABG does not show any hypercapnia.  Will check ammonia levels and closely observe.  Check urine drug screen Tylenol level salicylate levels.  Will need to get further history from family when available.  I try to reach patient's nephew brought in the chart unable to reach.  For now I am holding off patient's gabapentin pain medications and Xanax. 2. Left hip pain with chronic CT scan findings.  Orthopedics to see patient. 3. COPD not actively wheezing continue inhalers. 4. Hypertension on atenolol. 5. History of depression on Wellbutrin fluoxetine Saphris.  Holding of Xanax due to lethargic. 6. Chronic pain holding pain medication due to #1.  Restart with no alert awake. 7. Mild anemia follow CBC. 8. Sacral wound for which wound team has been consulted. 9. History of chronic pain currently is on Creon.  Instructed nurses not to feed until patient is alert awake.   DVT prophylaxis: SCDs anticipation of possible procedure for now. Code Status: Full code. Family Communication: Unable to reach family. Disposition Plan: To be determined. Consults called: Orthopedics. Admission status: Observation.   Eduard Clos MD [facility] Pager [phone].  If 7PM-7AM, please contact night-coverage [email or web address] Password [email or web address]  06/04/2019, 5:00 AM

## 2019-06-04 NOTE — ED Notes (Signed)
Patient very drowsy, respirations are e/u, VSS, nad.

## 2019-06-04 NOTE — ED Notes (Signed)
Patient much more alert now, a/ox4, awake and able to have conversation with this RN.

## 2019-06-04 NOTE — ED Notes (Signed)
Respiratory contacted to obtain ABG.  

## 2019-06-04 NOTE — ED Notes (Signed)
The pt is fast asleep 

## 2019-06-04 NOTE — ED Notes (Signed)
Pt eating breakfast. States she had fallen when she was in the bathroom alone. States she has multiple skin breakdown areas on buttocks.

## 2019-06-04 NOTE — ED Notes (Signed)
Pt sleeping soundly.

## 2019-06-04 NOTE — ED Notes (Signed)
Dr. Toniann Fail paged to inquire if patient's bed status needs to remain progressive since patient is awake, a/ox4 with stable vitals. MD advised he would change patient's bed order to be TELE.

## 2019-06-04 NOTE — ED Notes (Signed)
Pt refused Lidoderm Patch d/t states wants it placed on open areas on her buttocks.

## 2019-06-04 NOTE — Consult Note (Signed)
Orthopaedic Trauma Service (OTS) Consult   Patient ID: Tammy Hines MRN: [id number] DOB/AGE: October 08, 1960 59 y.o.  Reason for Consult:Left hip pain Referring Physician: Dr. Adalberto Cole, MD [facility]  HPI: Tammy Hines is an 59 y.o. female who is being seen in consultation at the request of Dr. Jeraldine Loots for evaluation of left hip pain.  The patient fell at home and had increased hip pain.  She has had chronic hip pain for over a year now.  She is seeing Dr. Madelon Lips with [facility].  She has been trying to lose weight to meet the threshold for BMI to the able to be performed a total hip arthroplasty.  When she presents to the emergency room she had x-rays that were concerning for a possible greater trochanteric femur fracture.  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  No acute fracture was noted on the CT scan.  She was admitted to the hospitalist for altered mental status.  Patient was seen in the emergency room.  She appears comfortable and coherent.  She states that she is concerned about her pressure sores to her buttocks that have not healed.  In regards to her left hip she states that she has not been able to walk for over a year secondary to the pain.  She uses her walker for assistance.  Past Medical History:  Diagnosis Date  . Anxiety   . Arthritis    "right knee, left hip, going down my neck into my right shoulder" (06/24/2016)  . Asthma   . Bipolar 1 disorder (HCC)   . Chronic lower back pain   . Chronic neck pain   . COPD (chronic obstructive pulmonary disease) (HCC)   . Depression    [name] 06/24/2016  . Family history of adverse reaction to anesthesia    "makes my mom throw up"   . Fibromyalgia   . GERD (gastroesophageal reflux disease)    [name] 06/24/2016  . Hepatitis B   . History of hiatal hernia   . History of stomach ulcers   . History of transient ischemic  attack (TIA)    /RN 06/24/2016  . Hyperlipidemia   . Hypertension   . Ischemic stroke (HCC)    acute/notes 06/24/2016; "left sided weakness"/RN (06/24/2016  . Migraine    "a few/year now" (06/24/2016)  . Obesity   . PONV (postoperative nausea and vomiting)     Past Surgical History:  Procedure Laterality Date  . ANAL FISSURE REPAIR    . FRACTURE SURGERY    . LOOP RECORDER INSERTION N/A 06/26/2016   Procedure: Loop Recorder Insertion;  Surgeon: Hillis Range, MD;  Location: [facility];  Service: Cardiovascular;  Laterality: N/A;  . MULTIPLE TOOTH EXTRACTIONS  2018   "I had 6 teeth pulled"  . SHOULDER ARTHROSCOPY W/ ROTATOR CUFF REPAIR Right 2000  . SHOULDER SURGERY Right 1999  . TEE WITHOUT CARDIOVERSION N/A 06/26/2016   Procedure: TRANSESOPHAGEAL ECHOCARDIOGRAM (TEE);  Surgeon: Thurmon Fair, MD;  Location: [facility];  Service: Cardiovascular;  Laterality: N/A;    Family History  Problem Relation Age of Onset  . Hypertension Mother   . Cancer Father        esophageal  . Heart attack Maternal Grandfather     Social History:  reports that she has been smoking cigarettes. She has a 6.25 pack-year smoking history. She has never used smokeless tobacco. She reports  current alcohol use of about 1.0 standard drinks of alcohol per week. She reports that she does not use drugs.  Allergies:  Allergies  Allergen Reactions  . Ampicillin Hives and Nausea And Vomiting    Has patient had a PCN reaction causing immediate rash, facial/tongue/throat swelling, SOB or lightheadedness with hypotension: No Has patient had a PCN reaction causing severe rash involving mucus membranes or skin necrosis: No Has patient had a PCN reaction that required hospitalization No Has patient had a PCN reaction occurring within the last 10 years: No If all of the above answers are "NO", then may proceed with Cephalosporin use.   Marland Kitchen Cleocin [Clindamycin Hcl] Other (See Comments)    "deathly sick"  .  Clonazepam Other (See Comments)    Unknown reaction  . Other Other (See Comments)    Allergy to mold, down and feathers per allergy test  . Penicillins Other (See Comments)    Has patient had a PCN reaction causing immediate rash, facial/tongue/throat swelling, SOB or lightheadedness with hypotension: No Has patient had a PCN reaction causing severe rash involving mucus membranes or skin necrosis: No Has patient had a PCN reaction that required hospitalization No Has patient had a PCN reaction occurring within the last 10 years: No If all of the above answers are "NO", then may proceed with Cephalosporin use.   Pt told not to take because of reaction to ampicillin - no    Medications:  No current facility-administered medications on file prior to encounter.   Current Outpatient Medications on File Prior to Encounter  Medication Sig Dispense Refill  . albuterol (PROVENTIL HFA;VENTOLIN HFA) 108 (90 Base) MCG/ACT inhaler Inhale 2 puffs into the lungs every 6 (six) hours as needed for wheezing or shortness of breath.    Marland Kitchen albuterol (PROVENTIL) (2.5 MG/3ML) 0.083% nebulizer solution Take 3 mLs (2.5 mg total) by nebulization every 6 (six) hours as needed for wheezing or shortness of breath. 75 mL 0  . alprazolam (XANAX) 2 MG tablet Take 2 mg by mouth 3 (three) times daily.     Marland Kitchen aspirin EC 325 MG tablet Take 325 mg by mouth daily.    Marland Kitchen atenolol (TENORMIN) 50 MG tablet Take 100 mg by mouth at bedtime.     Marland Kitchen buPROPion (WELLBUTRIN XL) 150 MG 24 hr tablet Take 150 mg by mouth daily.    . clomiPRAMINE (ANAFRANIL) 50 MG capsule Take 50 mg by mouth at bedtime.    Marland Kitchen CREON 36000 units CPEP capsule Take 36,000 Units by mouth 3 (three) times daily with meals.     . dicyclomine (BENTYL) 10 MG capsule Take 10 mg by mouth 3 (three) times daily before meals.     Marland Kitchen FLUoxetine (PROZAC) 40 MG capsule Take 120 mg by mouth daily.     . furosemide (LASIX) 20 MG tablet Take 40 mg by mouth 2 (two) times a day.  Patient taking 2 tablets daily    . gabapentin (NEURONTIN) 800 MG tablet Take 800 mg by mouth 4 (four) times daily.    . hydrOXYzine (VISTARIL) 25 MG capsule Take 25 mg by mouth 4 (four) times daily.    . Mirabegron (MYRBETRIQ PO) Take 1 tablet by mouth every evening.    . mometasone-formoterol (DULERA) 200-5 MCG/ACT AERO Inhale 1 puff into the lungs 2 (two) times daily. 1 Inhaler 0  . naproxen (NAPROSYN) 500 MG tablet Take 500 mg by mouth 2 (two) times daily.    Marland Kitchen oxybutynin (DITROPAN XL) 15 MG  24 hr tablet Take 15 mg by mouth at bedtime.     Marland Kitchen oxyCODONE-acetaminophen (PERCOCET) 10-325 MG tablet Take 1 tablet by mouth 3 (three) times daily.    . pantoprazole (PROTONIX) 40 MG tablet Take 40 mg by mouth daily.     Marland Kitchen SAPHRIS 10 MG SUBL Take 10 mg by mouth See admin instructions. Place one tablet (10 mg) under the tongue twice during the night    . ciclopirox (PENLAC) 8 % solution Apply topically at bedtime. Apply over nail and surrounding skin. Apply daily over previous coat. After seven (7) days, may remove with alcohol and continue cycle. (Patient not taking: Reported on 10/22/2018) 6.6 mL 2    ROS: Constitutional: No fever or chills Vision: No changes in vision ENT: No difficulty swallowing CV: No chest pain Pulm: No SOB or wheezing GI: No nausea or vomiting GU: No urgency or inability to hold urine Skin: No poor wound healing Neurologic: No numbness or tingling Psychiatric: No depression or anxiety Heme: No bruising Allergic: No reaction to medications or food   Exam: Blood pressure (!) 120/55, pulse 86, temperature 98.2 F (36.8 C), temperature source Oral, resp. rate 16, height 5\' 7"  (1.702 m), weight 120.7 kg, SpO2 (!) 10 %. General: No acute distress Orientation: Awake and alert Mood and Affect: Cooperative Gait: Unable to ambulate secondary to pain Coordination and balance: Within normal limits  Left lower extremity: Skin without lesions.  Pain with any attempted hip range  of motion.  She is neurovascular intact distally.  No instability about the knee or the ankle.    Medical Decision Making: Data: Imaging: X-rays and CT scan showed destructive bone-on-bone arthritis with subluxation of the femoral head with collapse of the head as well.  Labs:  Results for orders placed or performed during the hospital encounter of 06/03/19 (from the past 24 hour(s))  Comprehensive metabolic panel     Status: Abnormal   Collection Time: 06/03/19  6:10 PM  Result Value Ref Range   Sodium 140 135 - 145 mmol/L   Potassium 3.7 3.5 - 5.1 mmol/L   Chloride 106 98 - 111 mmol/L   CO2 24 22 - 32 mmol/L   Glucose, Bld 104 (H) 70 - 99 mg/dL   BUN 16 6 - 20 mg/dL   Creatinine, Ser 06/05/19 0.44 - 1.00 mg/dL   Calcium 8.8 (L) 8.9 - 10.3 mg/dL   Total Protein 6.1 (L) 6.5 - 8.1 g/dL   Albumin 3.3 (L) 3.5 - 5.0 g/dL   AST 27 15 - 41 U/L   ALT 29 0 - 44 U/L   Alkaline Phosphatase 94 38 - 126 U/L   Total Bilirubin 0.4 0.3 - 1.2 mg/dL   GFR calc non Af Amer >60 >60 mL/min   GFR calc Af Amer >60 >60 mL/min   Anion gap 10 5 - 15  CBC with Differential     Status: Abnormal   Collection Time: 06/03/19  6:10 PM  Result Value Ref Range   WBC 8.7 4.0 - 10.5 K/uL   RBC 4.62 3.87 - 5.11 MIL/uL   Hemoglobin 11.1 (L) 12.0 - 15.0 g/dL   HCT 06/05/19 18.8 - 41.6 %   MCV 83.8 80.0 - 100.0 fL   MCH 24.0 (L) 26.0 - 34.0 pg   MCHC 28.7 (L) 30.0 - 36.0 g/dL   RDW 60.6 (H) 30.1 - 60.1 %   Platelets 272 150 - 400 K/uL   nRBC 0.0 0.0 - 0.2 %  Neutrophils Relative % 54 %   Neutro Abs 4.8 1.7 - 7.7 K/uL   Lymphocytes Relative 33 %   Lymphs Abs 2.9 0.7 - 4.0 K/uL   Monocytes Relative 7 %   Monocytes Absolute 0.6 0.1 - 1.0 K/uL   Eosinophils Relative 4 %   Eosinophils Absolute 0.4 0.0 - 0.5 K/uL   Basophils Relative 1 %   Basophils Absolute 0.1 0.0 - 0.1 K/uL   Immature Granulocytes 1 %   Abs Immature Granulocytes 0.04 0.00 - 0.07 K/uL  Ethanol     Status: None   Collection Time: 06/04/19  1:33  AM  Result Value Ref Range   Alcohol, Ethyl (B) <10 <10 mg/dL  I-STAT 7, (LYTES, BLD GAS, ICA, H+H)     Status: Abnormal   Collection Time: 06/04/19  4:43 AM  Result Value Ref Range   pH, Arterial 7.393 7.350 - 7.450   pCO2 arterial 45.7 32.0 - 48.0 mmHg   pO2, Arterial 58.0 (L) 83.0 - 108.0 mmHg   Bicarbonate 27.9 20.0 - 28.0 mmol/L   TCO2 29 22 - 32 mmol/L   O2 Saturation 90.0 %   Acid-Base Excess 2.0 0.0 - 2.0 mmol/L   Sodium 142 135 - 145 mmol/L   Potassium 3.6 3.5 - 5.1 mmol/L   Calcium, Ion 1.25 1.15 - 1.40 mmol/L   HCT 32.0 (L) 36.0 - 46.0 %   Hemoglobin 10.9 (L) 12.0 - 15.0 g/dL   Patient temperature 98.2 F    Collection site RADIAL, ALLEN'S TEST ACCEPTABLE    Drawn by RT    Sample type ARTERIAL   Acetaminophen level     Status: None   Collection Time: 06/04/19  6:17 AM  Result Value Ref Range   Acetaminophen (Tylenol), Serum 17 10 - 30 ug/mL  Salicylate level     Status: Abnormal   Collection Time: 06/04/19  6:17 AM  Result Value Ref Range   Salicylate Lvl <6.3 (L) 7.0 - 30.0 mg/dL  Hepatic function panel     Status: Abnormal   Collection Time: 06/04/19  6:17 AM  Result Value Ref Range   Total Protein 5.8 (L) 6.5 - 8.1 g/dL   Albumin 3.0 (L) 3.5 - 5.0 g/dL   AST 30 15 - 41 U/L   ALT 29 0 - 44 U/L   Alkaline Phosphatase 81 38 - 126 U/L   Total Bilirubin 0.4 0.3 - 1.2 mg/dL   Bilirubin, Direct <0.1 0.0 - 0.2 mg/dL   Indirect Bilirubin NOT CALCULATED 0.3 - 0.9 mg/dL  Basic metabolic panel     Status: Abnormal   Collection Time: 06/04/19  6:17 AM  Result Value Ref Range   Sodium 140 135 - 145 mmol/L   Potassium 3.4 (L) 3.5 - 5.1 mmol/L   Chloride 106 98 - 111 mmol/L   CO2 26 22 - 32 mmol/L   Glucose, Bld 92 70 - 99 mg/dL   BUN 14 6 - 20 mg/dL   Creatinine, Ser 0.68 0.44 - 1.00 mg/dL   Calcium 8.7 (L) 8.9 - 10.3 mg/dL   GFR calc non Af Amer >60 >60 mL/min   GFR calc Af Amer >60 >60 mL/min   Anion gap 8 5 - 15  CBC with Differential/Platelet     Status:  Abnormal   Collection Time: 06/04/19  6:17 AM  Result Value Ref Range   WBC 6.8 4.0 - 10.5 K/uL   RBC 4.54 3.87 - 5.11 MIL/uL   Hemoglobin 11.0 (L) 12.0 -  15.0 g/dL   HCT 77.8 24.2 - 35.3 %   MCV 83.5 80.0 - 100.0 fL   MCH 24.2 (L) 26.0 - 34.0 pg   MCHC 29.0 (L) 30.0 - 36.0 g/dL   RDW 61.4 (H) 43.1 - 54.0 %   Platelets 228 150 - 400 K/uL   nRBC 0.0 0.0 - 0.2 %   Neutrophils Relative % 59 %   Neutro Abs 4.1 1.7 - 7.7 K/uL   Lymphocytes Relative 29 %   Lymphs Abs 2.0 0.7 - 4.0 K/uL   Monocytes Relative 7 %   Monocytes Absolute 0.4 0.1 - 1.0 K/uL   Eosinophils Relative 4 %   Eosinophils Absolute 0.3 0.0 - 0.5 K/uL   Basophils Relative 1 %   Basophils Absolute 0.1 0.0 - 0.1 K/uL   Immature Granulocytes 0 %   Abs Immature Granulocytes 0.02 0.00 - 0.07 K/uL  Ammonia     Status: None   Collection Time: 06/04/19  6:17 AM  Result Value Ref Range   Ammonia 27 9 - 35 umol/L  Urine rapid drug screen (hosp performed)     Status: Abnormal   Collection Time: 06/04/19  6:17 AM  Result Value Ref Range   Opiates POSITIVE (A) NONE DETECTED   Cocaine NONE DETECTED NONE DETECTED   Benzodiazepines POSITIVE (A) NONE DETECTED   Amphetamines NONE DETECTED NONE DETECTED   Tetrahydrocannabinol NONE DETECTED NONE DETECTED   Barbiturates NONE DETECTED NONE DETECTED  Urinalysis, Routine w reflex microscopic     Status: None   Collection Time: 06/04/19  6:17 AM  Result Value Ref Range   Color, Urine YELLOW YELLOW   APPearance CLEAR CLEAR   Specific Gravity, Urine 1.018 1.005 - 1.030   pH 6.0 5.0 - 8.0   Glucose, UA NEGATIVE NEGATIVE mg/dL   Hgb urine dipstick NEGATIVE NEGATIVE   Bilirubin Urine NEGATIVE NEGATIVE   Ketones, ur NEGATIVE NEGATIVE mg/dL   Protein, ur NEGATIVE NEGATIVE mg/dL   Nitrite NEGATIVE NEGATIVE   Leukocytes,Ua NEGATIVE NEGATIVE    Imaging or Labs ordered: None  Medical history and chart was reviewed and case discussed with medical  provider.  Assessment/Plan: 59 year old female with multiple medical comorbidities with end-stage hip arthritis with no acute fracture.  The patient does not require any acute intervention.  This is a longstanding problem and she is being followed by Dr. Madelon Lips for this.  I do not recommend any further orthopedic intervention.  She may follow-up with Dr. Madelon Lips to discuss further the possibility of total hip arthroplasty.  Continue with medical management per the hospitalist service.  Roby Lofts, MD [facility] [phone] (office) [email or web address]

## 2019-06-04 NOTE — ED Notes (Signed)
Patient placed on 2L O2 via n.c. due to PO2 of 58 on ABG and O2 sat of 92% on room air while pt resting.

## 2019-06-04 NOTE — ED Notes (Signed)
Pt noted w/reddened area to groin and 2-3 open ares to buttocks - slight bleeding noted.

## 2019-06-05 DIAGNOSIS — G9340 Encephalopathy, unspecified: Secondary | ICD-10-CM | POA: Diagnosis not present

## 2019-06-05 MED ORDER — LIDOCAINE-PRILOCAINE 2.5-2.5 % EX CREA: TOPICAL_CREAM | Freq: Four times a day (QID) | CUTANEOUS | 1 refills | Status: AC | PRN

## 2019-06-05 MED ORDER — LIDOCAINE-PRILOCAINE 2.5-2.5 % EX CREA: TOPICAL_CREAM | Freq: Four times a day (QID) | CUTANEOUS | 1 refills | Status: DC | PRN

## 2019-06-05 MED ORDER — LIDOCAINE-PRILOCAINE 2.5-2.5 % EX CREA: TOPICAL_CREAM | Freq: Four times a day (QID) | CUTANEOUS | Status: DC | PRN

## 2019-06-05 MED ORDER — SULFAMETHOXAZOLE-TRIMETHOPRIM 400-80 MG PO TABS: 1.0000 | ORAL_TABLET | Freq: Two times a day (BID) | ORAL | 0 refills | Status: DC

## 2019-06-05 MED ORDER — POTASSIUM CHLORIDE CRYS ER 20 MEQ PO TBCR
40.0000 meq | EXTENDED_RELEASE_TABLET | Freq: Once | ORAL | Status: AC
  Administered 2019-06-05: 40 meq via ORAL
  Filled 2019-06-05: qty 2

## 2019-06-05 NOTE — Care Management Obs Status (Signed)
MEDICARE OBSERVATION STATUS NOTIFICATION   Patient Details  Name: Tammy Hines MRN: [id number] Date of Birth: 1961/02/21   Medicare Observation Status Notification Given:  Yes    Lawerance Sabal, RN 06/05/2019, 12:37 PM

## 2019-06-05 NOTE — Progress Notes (Signed)
Tammy Hines to be discharged Home per MD order. Discussed prescriptions and follow up appointments with the patient. Prescriptions given to patient; medication list explained in detail. Patient verbalized understanding.  Skin clean, dry and intact without evidence of skin break down, no evidence of skin tears noted. IV catheter discontinued intact. Site without signs and symptoms of complications. Dressing and pressure applied. Pt denies pain at the site currently. No complaints noted.  Patient free of lines, drains, and wounds.   An After Visit Summary (AVS) was printed and given to the patient. Patient escorted via wheelchair, and discharged home via private auto.  Shanna Cisco, RN

## 2019-06-05 NOTE — Discharge Summary (Signed)
Physician Discharge Summary  Tammy Hines XNA:[id number] DOB: 10/01/1960 DOA: 06/03/2019  PCP: Quitman Livings, MD  Admit date: 06/03/2019 Discharge date: 06/05/2019  Admitted From: Home Disposition:  Home  Discharge Condition:Stable CODE STATUS:FULL Diet recommendation: Heart Healthy   Brief/Interim Summary: Patient is a 59 year old female with history of COPD, depression, anxiety, chronic pain syndrome, hypertension, fibromyalgia, diastolic CHF who presented with a fall at home.  She lives in a trailer.  She was complaining of left hip pain which is chronic.  CT of the left hip shows chronic changes with subluxation of the femoral head.  Orthopedics was consulted, no intervention recommended.  She became lethargic and confused at the emergency department but later became  alert and oriented. Chest x-ray showed chronic vascular congestion. She has history of COPD and currently she is not in exacerbation. She has chronic bilateral lower extremity edema but was not taking  Lasix at home. Patient also complains of pain on the buttocks and examination revealed a stage I-II shallow pressure ulcers on the buttocks.  She was seen by orthopedics and did not recommend any further orthopedic intervention.  She follows with orthopedics as an outpatient.  This morning she is hemodynamically stable.  We had requested for PT/OT evaluation but despite multiple requests, she does not want PT/OT evaluation and wants to go home.  Following problems were addressed during her hospitalization:  1. Acute encephalopathy -patient did take more than usual dose of oxycodone which likely could be contributing to her confusion ABG does not show any hypercapnia.    Urine was positive for benzo and opiates.  Currently she is alert and oriented. 2. Left hip pain with chronic CT scan findings-  Orthopedics consulted.  No plan for intervention. She follows with orthopedics as an outpatient .  Follow-up with physical therapy as  an outpatient. 3. COPD -not actively wheezing ,continue inhalers. 4. Hypertension- on atenolol. 5. History of depression on -Wellbutrin, fluoxetine, Saphris.  Takes Xanax at home for anxiety. 6. Chronic pain syndrome-follow-up with pain management as an outpatient. 7. Mild anemia -Hb stable and at baseline 8. Stage 1-2 sacral wound- Dont appear to be obviously infected.Started on bactrim 9. Chronic diastolic CHF/bilateral lower extremity edema-supposed to take Lasix at home but currently not taking because of frequent urination.  I have requested her to continue taking Lasix.     Discharge Diagnoses:  Principal Problem:   Acute encephalopathy Active Problems:   Bipolar disorder (HCC)   Hypertension   Hyperlipidemia   Left hip pain    Discharge Instructions  Discharge Instructions    Diet - low sodium heart healthy   Complete by: As directed    Discharge instructions   Complete by: As directed    1)Please follow up with your PCP in a week. 2)Take prescribe medications as instructed. 3)Continue taking lasix for your leg edema.   Increase activity slowly   Complete by: As directed      Allergies as of 06/05/2019      Reactions   Ampicillin Hives, Nausea And Vomiting   Has patient had a PCN reaction causing immediate rash, facial/tongue/throat swelling, SOB or lightheadedness with hypotension: No Has patient had a PCN reaction causing severe rash involving mucus membranes or skin necrosis: No Has patient had a PCN reaction that required hospitalization No Has patient had a PCN reaction occurring within the last 10 years: No If all of the above answers are "NO", then may proceed with Cephalosporin use.   Cleocin [clindamycin Hcl]  Other (See Comments)   "deathly sick"   Clonazepam Other (See Comments)   Unknown reaction   Other Other (See Comments)   Allergy to mold, down and feathers per allergy test   Penicillins Other (See Comments)   Has patient had a PCN reaction  causing immediate rash, facial/tongue/throat swelling, SOB or lightheadedness with hypotension: No Has patient had a PCN reaction causing severe rash involving mucus membranes or skin necrosis: No Has patient had a PCN reaction that required hospitalization No Has patient had a PCN reaction occurring within the last 10 years: No If all of the above answers are "NO", then may proceed with Cephalosporin use. Pt told not to take because of reaction to ampicillin - no      Medication List    TAKE these medications   albuterol 108 (90 Base) MCG/ACT inhaler Commonly known as: VENTOLIN HFA Inhale 2 puffs into the lungs every 6 (six) hours as needed for wheezing or shortness of breath.   albuterol (2.5 MG/3ML) 0.083% nebulizer solution Commonly known as: PROVENTIL Take 3 mLs (2.5 mg total) by nebulization every 6 (six) hours as needed for wheezing or shortness of breath.   alprazolam 2 MG tablet Commonly known as: XANAX Take 2 mg by mouth 3 (three) times daily.   aspirin EC 325 MG tablet Take 325 mg by mouth daily.   atenolol 50 MG tablet Commonly known as: TENORMIN Take 100 mg by mouth at bedtime.   buPROPion 150 MG 24 hr tablet Commonly known as: WELLBUTRIN XL Take 150 mg by mouth daily.   ciclopirox 8 % solution Commonly known as: Penlac Apply topically at bedtime. Apply over nail and surrounding skin. Apply daily over previous coat. After seven (7) days, may remove with alcohol and continue cycle.   clomiPRAMINE 50 MG capsule Commonly known as: ANAFRANIL Take 50 mg by mouth at bedtime.   Creon 36000 UNITS Cpep capsule Generic drug: lipase/protease/amylase Take 36,000 Units by mouth 3 (three) times daily with meals.   dicyclomine 10 MG capsule Commonly known as: BENTYL Take 10 mg by mouth 3 (three) times daily before meals.   FLUoxetine 40 MG capsule Commonly known as: PROZAC Take 120 mg by mouth daily.   furosemide 20 MG tablet Commonly known as: LASIX Take 40 mg  by mouth 2 (two) times a day. Patient taking 2 tablets daily   gabapentin 800 MG tablet Commonly known as: NEURONTIN Take 800 mg by mouth 4 (four) times daily.   hydrOXYzine 25 MG capsule Commonly known as: VISTARIL Take 25 mg by mouth 4 (four) times daily.   lidocaine-prilocaine cream Commonly known as: EMLA Apply topically every 6 (six) hours as needed (apply to painful area).   mometasone-formoterol 200-5 MCG/ACT Aero Commonly known as: DULERA Inhale 1 puff into the lungs 2 (two) times daily.   MYRBETRIQ PO Take 1 tablet by mouth every evening.   naproxen 500 MG tablet Commonly known as: NAPROSYN Take 500 mg by mouth 2 (two) times daily.   oxybutynin 15 MG 24 hr tablet Commonly known as: DITROPAN XL Take 15 mg by mouth at bedtime.   oxyCODONE-acetaminophen 10-325 MG tablet Commonly known as: PERCOCET Take 1 tablet by mouth 3 (three) times daily.   pantoprazole 40 MG tablet Commonly known as: PROTONIX Take 40 mg by mouth daily.   Saphris 10 MG Subl Generic drug: Asenapine Maleate Take 10 mg by mouth See admin instructions. Place one tablet (10 mg) under the tongue twice during the night  sulfamethoxazole-trimethoprim 400-80 MG tablet Commonly known as: Bactrim Take 1 tablet by mouth 2 (two) times daily.      Follow-up Information    Quitman Livings, MD. Schedule an appointment as soon as possible for a visit in 1 week(s).   Specialty: Internal Medicine Contact information: [address] [place] [address] [phone]          Allergies  Allergen Reactions  . Ampicillin Hives and Nausea And Vomiting    Has patient had a PCN reaction causing immediate rash, facial/tongue/throat swelling, SOB or lightheadedness with hypotension: No Has patient had a PCN reaction causing severe rash involving mucus membranes or skin necrosis: No Has patient had a PCN reaction that required hospitalization No Has patient had a PCN reaction occurring within the  last 10 years: No If all of the above answers are "NO", then may proceed with Cephalosporin use.   Marland Kitchen Cleocin [Clindamycin Hcl] Other (See Comments)    "deathly sick"  . Clonazepam Other (See Comments)    Unknown reaction  . Other Other (See Comments)    Allergy to mold, down and feathers per allergy test  . Penicillins Other (See Comments)    Has patient had a PCN reaction causing immediate rash, facial/tongue/throat swelling, SOB or lightheadedness with hypotension: No Has patient had a PCN reaction causing severe rash involving mucus membranes or skin necrosis: No Has patient had a PCN reaction that required hospitalization No Has patient had a PCN reaction occurring within the last 10 years: No If all of the above answers are "NO", then may proceed with Cephalosporin use.   Pt told not to take because of reaction to ampicillin - no    Consultations:  orthopedics   Procedures/Studies: DG Chest 2 View  Result Date: 06/03/2019 CLINICAL DATA:  Preoperative evaluation, fell EXAM: CHEST - 2 VIEW COMPARISON:  10/22/2018 FINDINGS: Frontal and lateral views of the chest are obtained. Loop recorder is seen within the anterior chest. Cardiac silhouette is enlarged, accentuated by portable AP technique. Mild central vascular congestion without superimposed airspace disease, effusion, or pneumothorax. IMPRESSION: 1. Chronic central vascular congestion.  No acute process. Electronically Signed   By: Sharlet Salina M.D.   On: 06/03/2019 23:08   CT Head Wo Contrast  Result Date: 06/04/2019 CLINICAL DATA:  Slip and fall EXAM: CT HEAD WITHOUT CONTRAST TECHNIQUE: Contiguous axial images were obtained from the base of the skull through the vertex without intravenous contrast. COMPARISON:  April 10, 2018 FINDINGS: Brain: No evidence of acute territorial infarction, hemorrhage, hydrocephalus,extra-axial collection or mass lesion/mass effect. Normal gray-white differentiation. Ventricles are normal in  size and contour. Vascular: No hyperdense vessel or unexpected calcification. Skull: The skull is intact. No fracture or focal lesion identified. Sinuses/Orbits: The visualized paranasal sinuses and mastoid air cells are clear. The orbits and globes intact. Other: None Cervical spine: Alignment: Again noted is a minimal anterolisthesis of C2 on C3 and C5 on C6. Skull base and vertebrae: Visualized skull base is intact. No atlanto-occipital dissociation. The vertebral body heights are well maintained. No fracture or pathologic osseous lesion seen. Soft tissues and spinal canal: The visualized paraspinal soft tissues are unremarkable. No prevertebral soft tissue swelling is seen. The spinal canal is grossly unremarkable, no large epidural collection or significant canal narrowing. Disc levels: Multilevel cervical spine spondylosis is seen with disc osteophyte complex and uncovertebral osteophytes. This is most notable at C5-C6 with moderate to severe bilateral neural foraminal narrowing and mild central canal stenosis. Upper  chest: The lung apices are clear. Again noted is a heterogeneous nodule within the left thyroid lobe measuring 3 cm. Other: None IMPRESSION: 1. No acute intracranial abnormality. 2.  No acute fracture or malalignment of the spine. 3. Unchanged minimal anterolisthesis of C3 on C4 and C5 on C6. 4. Cervical spine spondylosis most notable at C5-C6. 5. Unchanged 3 cm left thyroid lobe nodule. Electronically Signed   By: Jonna Clark M.D.   On: 06/04/2019 00:40   CT Cervical Spine Wo Contrast  Result Date: 06/04/2019 CLINICAL DATA:  Slip and fall EXAM: CT HEAD WITHOUT CONTRAST TECHNIQUE: Contiguous axial images were obtained from the base of the skull through the vertex without intravenous contrast. COMPARISON:  April 10, 2018 FINDINGS: Brain: No evidence of acute territorial infarction, hemorrhage, hydrocephalus,extra-axial collection or mass lesion/mass effect. Normal gray-white differentiation.  Ventricles are normal in size and contour. Vascular: No hyperdense vessel or unexpected calcification. Skull: The skull is intact. No fracture or focal lesion identified. Sinuses/Orbits: The visualized paranasal sinuses and mastoid air cells are clear. The orbits and globes intact. Other: None Cervical spine: Alignment: Again noted is a minimal anterolisthesis of C2 on C3 and C5 on C6. Skull base and vertebrae: Visualized skull base is intact. No atlanto-occipital dissociation. The vertebral body heights are well maintained. No fracture or pathologic osseous lesion seen. Soft tissues and spinal canal: The visualized paraspinal soft tissues are unremarkable. No prevertebral soft tissue swelling is seen. The spinal canal is grossly unremarkable, no large epidural collection or significant canal narrowing. Disc levels: Multilevel cervical spine spondylosis is seen with disc osteophyte complex and uncovertebral osteophytes. This is most notable at C5-C6 with moderate to severe bilateral neural foraminal narrowing and mild central canal stenosis. Upper chest: The lung apices are clear. Again noted is a heterogeneous nodule within the left thyroid lobe measuring 3 cm. Other: None IMPRESSION: 1. No acute intracranial abnormality. 2.  No acute fracture or malalignment of the spine. 3. Unchanged minimal anterolisthesis of C3 on C4 and C5 on C6. 4. Cervical spine spondylosis most notable at C5-C6. 5. Unchanged 3 cm left thyroid lobe nodule. Electronically Signed   By: Jonna Clark M.D.   On: 06/04/2019 00:40   CT Hip Left Wo Contrast  Result Date: 06/04/2019 CLINICAL DATA:  Fall, left hip pain EXAM: CT OF THE LEFT HIP WITHOUT CONTRAST TECHNIQUE: Multidetector CT imaging of the left hip was performed according to the standard protocol. Multiplanar CT image reconstructions were also generated. COMPARISON:  06/03/2019, 08/02/2017 FINDINGS: Bones/Joint/Cartilage As seen on preceding x-ray, there is marked remodeling of the  left femoral head with evidence of subchondral collapse likely reflecting avascular necrosis. There is superior and lateral subluxation of the femoral head within the acetabulum. There is also sclerosis and remodeling of the left acetabulum. Numerous subchondral cysts are seen. There is a moderate left hip effusion with calcification of the left hip joint capsule. There are no acute displaced fractures. Visualized portions of the left hemipelvis are otherwise unremarkable. Ligaments Suboptimally assessed by CT. Muscles and Tendons No gross abnormalities. Soft tissues Visualized intraperitoneal structures are unremarkable. Aside from the left hip effusion, there are no discrete soft tissue abnormalities. Reconstructed images demonstrate no additional findings. IMPRESSION: 1. No acute displaced fracture. 2. Severe left hip arthropathy, with bony remodeling of the acetabulum and femoral head, superolateral subluxation of the femoral head, and moderate left hip effusion with capsular calcification. 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 Electronically  Signed   By: Sharlet SalinaMichael  Brown M.D.   On: 06/04/2019 00:42   DG Hip Unilat With Pelvis 2-3 Views Left  Result Date: 06/03/2019 CLINICAL DATA:  Fall with hip pain EXAM: DG HIP (WITH OR WITHOUT PELVIS) 2-3V LEFT COMPARISON:  08/02/2017 FINDINGS: Grossly abnormal left hip with mild superolateral subluxation of the left femoral head. Widened appearance of the joint space with slight fragmentation of the superolateral acetabulum. Left femoral head deformity and collapse. Possible acute nondisplaced fracture at the greater trochanter of the left hip. IMPRESSION: 1. Questionable nondisplaced fracture at the greater trochanter of the left femur 2. Interval abnormal appearance of left hip. Left femoral head deformity and collapse. Given widened appearance of joint space and associated acetabular changes,  consider septic arthritis/osteomyelitis of indeterminate acuity. Avascular necrosis due to other causes could also produce the changes at the left femoral head. Electronically Signed   By: Jasmine PangKim  Fujinaga M.D.   On: 06/03/2019 19:48       Subjective: Patient seen and examined at the bedside this morning.  Hemodynamically stable.  More comfortable today.  Feels better.  She did not want to wait for PT/OT evaluation and wants to leave.  Discharge Exam: Vitals:   06/05/19 0925 06/05/19 1137  BP: (!) 122/95   Pulse: 65 71  Resp: 18 16  Temp: 98.1 F (36.7 C)   SpO2: 93% 94%   Vitals:   06/04/19 2120 06/05/19 0504 06/05/19 0925 06/05/19 1137  BP:  130/77 (!) 122/95   Pulse:  62 65 71  Resp:  18 18 16   Temp:  98.1 F (36.7 C) 98.1 F (36.7 C)   TempSrc:  Oral Oral   SpO2: 98% 97% 93% 94%  Weight:      Height:        General: Pt is alert, awake, not in acute distress, morbidly obese Cardiovascular: RRR, S1/S2 +, no rubs, no gallops Respiratory: CTA bilaterally, no wheezing, no rhonchi Abdominal: Soft, NT, ND, bowel sounds + Extremities: 1-2+ lower extremity edema, no cyanosis    The results of significant diagnostics from this hospitalization (including imaging, microbiology, ancillary and laboratory) are listed below for reference.     Microbiology: Recent Results (from the past 240 hour(s))  SARS CORONAVIRUS 2 (TAT 6-24 HRS) Nasopharyngeal Nasopharyngeal Swab     Status: None   Collection Time: 06/03/19  1:43 AM   Specimen: Nasopharyngeal Swab  Result Value Ref Range Status   SARS Coronavirus 2 NEGATIVE NEGATIVE Final    Comment: (NOTE) SARS-CoV-2 target nucleic acids are NOT DETECTED. The SARS-CoV-2 RNA is generally detectable in upper and lower respiratory specimens during the acute phase of infection. Negative results do not preclude SARS-CoV-2 infection, do not rule out co-infections with other pathogens, and should not be used as the sole basis for treatment or  other patient management decisions. Negative results must be combined with clinical observations, patient history, and epidemiological information. The expected result is Negative. Fact Sheet for Patients: HairSlick.nohttps://www.fda.gov/media/138098/download Fact Sheet for Healthcare Providers: quierodirigir.comhttps://www.fda.gov/media/138095/download This test is not yet approved or cleared by the [place] FDA and  has been authorized for detection and/or diagnosis of SARS-CoV-2 by FDA under an Emergency Use Authorization (EUA). This EUA will remain  in effect (meaning this test can be used) for the duration of the COVID-19 declaration under Section 56 4(b)(1) of the Act, 21 U.S.C. section 360bbb-3(b)(1), unless the authorization is terminated or revoked sooner. Performed at [facility], [address] [place], [place]  [id number]      Labs: BNP (last 3 results) Recent Labs    10/22/18 1001  BNP 21.2   Basic Metabolic Panel: Recent Labs  Lab 06/03/19 1810 06/04/19 0443 06/04/19 0617  NA 140 142 140  K 3.7 3.6 3.4*  CL 106  --  106  CO2 24  --  26  GLUCOSE 104*  --  92  BUN 16  --  14  CREATININE 0.68  --  0.68  CALCIUM 8.8*  --  8.7*   Liver Function Tests: Recent Labs  Lab 06/03/19 1810 06/04/19 0617  AST 27 30  ALT 29 29  ALKPHOS 94 81  BILITOT 0.4 0.4  PROT 6.1* 5.8*  ALBUMIN 3.3* 3.0*   No results for input(s): LIPASE, AMYLASE in the last 168 hours. Recent Labs  Lab 06/04/19 0617  AMMONIA 27   CBC: Recent Labs  Lab 06/03/19 1810 06/04/19 0443 06/04/19 0617  WBC 8.7  --  6.8  NEUTROABS 4.8  --  4.1  HGB 11.1* 10.9* 11.0*  HCT 38.7 32.0* 37.9  MCV 83.8  --  83.5  PLT 272  --  228   Cardiac Enzymes: No results for input(s): CKTOTAL, CKMB, CKMBINDEX, TROPONINI in the last 168 hours. BNP: Invalid input(s): POCBNP CBG: No results for input(s): GLUCAP in the last 168 hours. D-Dimer No results for input(s): DDIMER in the last 72 hours. Hgb A1c No results  for input(s): HGBA1C in the last 72 hours. Lipid Profile No results for input(s): CHOL, HDL, LDLCALC, TRIG, CHOLHDL, LDLDIRECT in the last 72 hours. Thyroid function studies No results for input(s): TSH, T4TOTAL, T3FREE, THYROIDAB in the last 72 hours.  Invalid input(s): FREET3 Anemia work up No results for input(s): VITAMINB12, FOLATE, FERRITIN, TIBC, IRON, RETICCTPCT in the last 72 hours. Urinalysis    Component Value Date/Time   COLORURINE YELLOW 06/04/2019 0617   APPEARANCEUR CLEAR 06/04/2019 0617   LABSPEC 1.018 06/04/2019 0617   PHURINE 6.0 06/04/2019 0617   GLUCOSEU NEGATIVE 06/04/2019 0617   HGBUR NEGATIVE 06/04/2019 0617   BILIRUBINUR NEGATIVE 06/04/2019 0617   KETONESUR NEGATIVE 06/04/2019 0617   PROTEINUR NEGATIVE 06/04/2019 0617   NITRITE NEGATIVE 06/04/2019 0617   LEUKOCYTESUR NEGATIVE 06/04/2019 0617   Sepsis Labs Invalid input(s): PROCALCITONIN,  WBC,  LACTICIDVEN Microbiology Recent Results (from the past 240 hour(s))  SARS CORONAVIRUS 2 (TAT 6-24 HRS) Nasopharyngeal Nasopharyngeal Swab     Status: None   Collection Time: 06/03/19  1:43 AM   Specimen: Nasopharyngeal Swab  Result Value Ref Range Status   SARS Coronavirus 2 NEGATIVE NEGATIVE Final    Comment: (NOTE) SARS-CoV-2 target nucleic acids are NOT DETECTED. The SARS-CoV-2 RNA is generally detectable in upper and lower respiratory specimens during the acute phase of infection. Negative results do not preclude SARS-CoV-2 infection, do not rule out co-infections with other pathogens, and should not be used as the sole basis for treatment or other patient management decisions. Negative results must be combined with clinical observations, patient history, and epidemiological information. The expected result is Negative. Fact Sheet for Patients: HairSlick.nohttps://www.fda.gov/media/138098/download Fact Sheet for Healthcare Providers: quierodirigir.comhttps://www.fda.gov/media/138095/download This test is not yet approved or cleared  by the [place] FDA and  has been authorized for detection and/or diagnosis of SARS-CoV-2 by FDA under an Emergency Use Authorization (EUA). This EUA will remain  in effect (meaning this test can be used) for the duration of the COVID-19 declaration under Section 56 4(b)(1) of the Act, 21 U.S.C. section 360bbb-3(b)(1), unless the  authorization is terminated or revoked sooner. Performed at [facility], [address] [place], [place] [address]     Please note: You were cared for by a hospitalist during your hospital stay. Once you are discharged, your primary care physician will handle any further medical issues. Please note that NO REFILLS for any discharge medications will be authorized once you are discharged, as it is imperative that you return to your primary care physician (or establish a relationship with a primary care physician if you do not have one) for your post hospital discharge needs so that they can reassess your need for medications and monitor your lab values.    Time coordinating discharge: 40 minutes  SIGNED:   Burnadette Pop, MD  [facility] 06/05/2019, 12:22 PM Pager [phone]  If 7PM-7AM, please contact night-coverage [email or web address] Password [email or web address]

## 2019-06-21 ENCOUNTER — Ambulatory Visit: Payer: Medicare Other | Admitting: Physician Assistant

## 2019-06-22 ENCOUNTER — Ambulatory Visit: Payer: Medicare Other | Admitting: Internal Medicine

## 2019-07-12 ENCOUNTER — Ambulatory Visit (HOSPITAL_BASED_OUTPATIENT_CLINIC_OR_DEPARTMENT_OTHER): Payer: Medicare Other | Admitting: Internal Medicine

## 2019-07-25 ENCOUNTER — Encounter (HOSPITAL_BASED_OUTPATIENT_CLINIC_OR_DEPARTMENT_OTHER): Payer: Medicare Other | Attending: Internal Medicine | Admitting: Internal Medicine

## 2019-07-25 ENCOUNTER — Other Ambulatory Visit: Payer: Self-pay

## 2019-07-25 DIAGNOSIS — I69398 Other sequelae of cerebral infarction: Secondary | ICD-10-CM | POA: Insufficient documentation

## 2019-07-25 DIAGNOSIS — Z888 Allergy status to other drugs, medicaments and biological substances status: Secondary | ICD-10-CM | POA: Insufficient documentation

## 2019-07-25 DIAGNOSIS — Z881 Allergy status to other antibiotic agents status: Secondary | ICD-10-CM | POA: Insufficient documentation

## 2019-07-25 DIAGNOSIS — L97101 Non-pressure chronic ulcer of unspecified thigh limited to breakdown of skin: Secondary | ICD-10-CM | POA: Diagnosis not present

## 2019-07-25 DIAGNOSIS — L89312 Pressure ulcer of right buttock, stage 2: Secondary | ICD-10-CM | POA: Insufficient documentation

## 2019-07-25 DIAGNOSIS — Z880 Allergy status to penicillin: Secondary | ICD-10-CM | POA: Diagnosis not present

## 2019-07-25 DIAGNOSIS — L89322 Pressure ulcer of left buttock, stage 2: Secondary | ICD-10-CM | POA: Insufficient documentation

## 2019-07-25 DIAGNOSIS — I5032 Chronic diastolic (congestive) heart failure: Secondary | ICD-10-CM | POA: Insufficient documentation

## 2019-07-25 DIAGNOSIS — I110 Hypertensive heart disease with heart failure: Secondary | ICD-10-CM | POA: Insufficient documentation

## 2019-07-25 DIAGNOSIS — F17200 Nicotine dependence, unspecified, uncomplicated: Secondary | ICD-10-CM | POA: Diagnosis not present

## 2019-07-26 NOTE — Progress Notes (Signed)
Tammy, Hines ([id number]) Visit Report for 07/25/2019 Allergy List Details Patient Name: Date of Service: Tammy Hines, Tammy Hines 07/25/2019 2:45 PM Medical Record Number:[id number] Patient Account Number: [id number] Date of Birth/Sex: Treating RN: Nov 05, 1960 (59 y.o. Nancy Fetter Primary Care Demani Mcbrien: Antonietta Jewel Other Clinician: Referring Etheridge Geil: Treating Valta Dillon/Extender:Robson, Luana Shu, SAMI Weeks in Treatment: 0 Allergies Active Allergies ampicillin Reaction: hives, nausea/vomiting Severity: Moderate clindamycin Reaction: "deathly sick" Severity: Severe clonazepam Reaction: unknown penicillin Reaction: unknown Allergy Notes Electronic Signature(s) Signed: 07/25/2019 6:02:31 PM By: Levan Hurst RN, BSN Entered By: Levan Hurst on 07/25/2019 16:25:51 -------------------------------------------------------------------------------- Arrival Information Details Patient Name: Date of Service: Tammy Hines 07/25/2019 2:45 PM Medical Record [id number] Patient Account Number: [id number] Date of Birth/Sex: Treating RN: 1960-07-08 (59 y.o. Orvan Falconer Primary Care Dynastee Brummell: Antonietta Jewel Other Clinician: Referring Oleg Oleson: Treating Albin Duckett/Extender:Robson, Luana Shu, SAMI Weeks in Treatment: 0 Visit Information Patient Arrived: Wheel Chair Arrival Time: 15:25 Accompanied By: self Transfer Assistance: None Patient Identification Verified: Yes Secondary Verification Process Completed: Yes Patient Requires Transmission-Based No Precautions: Patient Has Alerts: No Electronic Signature(s) Signed: 07/26/2019 5:39:56 PM By: Carlene Coria RN Entered By: Carlene Coria on 07/25/2019 15:27:00 -------------------------------------------------------------------------------- Clinic Level of Care Assessment Details Patient Name: Date of Service: Tammy, Hines 07/25/2019 2:45 PM Medical Record Number:[id number] Patient Account Number:  [id number] Date of Birth/Sex: Treating RN: 1960-10-19 (59 y.o. Nancy Fetter Primary Care Gagan Dillion: Antonietta Jewel Other Clinician: Referring Nyjae Hodge: Treating Trei Schoch/Extender:Robson, Luana Shu, SAMI Weeks in Treatment: 0 Clinic Level of Care Assessment Items TOOL 2 Quantity Score X - Use when only an EandM is performed on the INITIAL visit 1 0 ASSESSMENTS - Nursing Assessment / Reassessment X - General Physical Exam (combine w/ comprehensive assessment (listed just below) 1 20 when performed on new pt. evals) X - Comprehensive Assessment (HX, ROS, Risk Assessments, Wounds Hx, etc.) 1 25 ASSESSMENTS - Wound and Skin Assessment / Reassessment []  - Simple Wound Assessment / Reassessment - one wound 0 X - Complex Wound Assessment / Reassessment - multiple wounds 6 5 []  - Dermatologic / Skin Assessment (not related to wound area) 0 ASSESSMENTS - Ostomy and/or Continence Assessment and Care []  - Incontinence Assessment and Management 0 []  - Ostomy Care Assessment and Management (repouching, etc.) 0 PROCESS - Coordination of Care X - Simple Patient / Family Education for ongoing care 1 15 []  - Complex (extensive) Patient / Family Education for ongoing care 0 X - Staff obtains Programmer, systems, Records, Test Results / Process Orders 1 10 []  - Staff telephones HHA, Nursing Homes / Clarify orders / etc 0 []  - Routine Transfer to another Facility (non-emergent condition) 0 []  - Routine Hospital Admission (non-emergent condition) 0 X - New Admissions / Biomedical engineer / Ordering NPWT, Apligraf, etc. 1 15 []  - Emergency Hospital Admission (emergent condition) 0 X - Simple Discharge Coordination 1 10 []  - Complex (extensive) Discharge Coordination 0 PROCESS - Special Needs []  - Pediatric / Minor Patient Management 0 []  - Isolation Patient Management 0 []  - Hearing / Language / Visual special needs 0 []  - Assessment of Community assistance (transportation, D/C planning, etc.) 0 []   - Additional assistance / Altered mentation 0 []  - Support Surface(s) Assessment (bed, cushion, seat, etc.) 0 INTERVENTIONS - Wound Cleansing / Measurement X - Wound Imaging (photographs - any number of wounds) 1 5 []  - Wound Tracing (instead of photographs) 0 []  - Simple Wound Measurement - one wound 0 X - Complex Wound Measurement - multiple wounds 6 5 []  -  Simple Wound Cleansing - one wound 0 X - Complex Wound Cleansing - multiple wounds 6 5 INTERVENTIONS - Wound Dressings []  - Small Wound Dressing one or multiple wounds 0 []  - Medium Wound Dressing one or multiple wounds 0 X - Large Wound Dressing one or multiple wounds 1 20 []  - Application of Medications - injection 0 INTERVENTIONS - Miscellaneous []  - External ear exam 0 []  - Specimen Collection (cultures, biopsies, blood, body fluids, etc.) 0 []  - Specimen(s) / Culture(s) sent or taken to Lab for analysis 0 []  - Patient Transfer (multiple staff / Nurse, adultHoyer Lift / Similar devices) 0 []  - Simple Staple / Suture removal (25 or less) 0 []  - Complex Staple / Suture removal (26 or more) 0 []  - Hypo / Hyperglycemic Management (close monitor of Blood Glucose) 0 []  - Ankle / Brachial Index (ABI) - do not check if billed separately 0 Has the patient been seen at the hospital within the last three years: Yes Total Score: 210 Level Of Care: New/Established - Level 5 Electronic Signature(s) Signed: 07/25/2019 6:02:31 PM By: Zandra AbtsLynch, Shatara RN, BSN Entered By: Zandra AbtsLynch, Shatara on 07/25/2019 18:01:41 -------------------------------------------------------------------------------- Encounter Discharge Information Details Patient Name: Date of Service: Tammy Hines, Karalyne K. 07/25/2019 2:45 PM Medical Record [id number] Patient Account Number: [id number] Date of Birth/Sex: Treating RN: Feb 29, 1961 (59 y.o. Wynelle LinkF) Lynch, Shatara Primary Care Tyller Bowlby: Quitman LivingsHASSAN, SAMI Other Clinician: Referring Antonae Zbikowski: Treating Maicy Filip/Extender:Robson,  Toula MoosMichael HASSAN, SAMI Weeks in Treatment: 0 Encounter Discharge Information Items Discharge Condition: Stable Ambulatory Status: Wheelchair Discharge Destination: Home Transportation: Private Auto Accompanied By: self Schedule Follow-up Appointment: Yes Clinical Summary of Care: Electronic Signature(s) Signed: 07/25/2019 5:42:42 PM By: Shawn Stalleaton, Bobbi Entered By: Shawn Stalleaton, Bobbi on 07/25/2019 16:45:00 -------------------------------------------------------------------------------- Multi Wound Chart Details Patient Name: Date of Service: Tammy Hines, Jassmine K. 07/25/2019 2:45 PM Medical Record [id number] Patient Account Number: [id number] Date of Birth/Sex: Treating RN: Feb 29, 1961 (59 y.o. Wynelle LinkF) Lynch, Shatara Primary Care Angella Montas: Quitman LivingsHASSAN, SAMI Other Clinician: Referring Alannah Averhart: Treating Nikash Mortensen/Extender:Robson, Toula MoosMichael HASSAN, SAMI Weeks in Treatment: 0 Vital Signs Height(in): 67 Pulse(bpm): 75 Weight(lbs): 268 Blood Pressure(mmHg): 124/84 Body Mass Index(BMI): 42 Temperature(F): 97.5 Respiratory 18 Rate(breaths/min): Photos: [1:No Photos] [2:No Photos] [3:No Photos] Wound Location: [1:Right, Proximal Gluteus] [2:Right Gluteus] [3:Right, Distal Gluteus] Wounding Event: [1:Gradually Appeared] [2:Gradually Appeared] [3:Gradually Appeared] Primary Etiology: [1:Pressure Ulcer] [2:Pressure Ulcer] [3:Pressure Ulcer] Comorbid History: [1:Chronic Obstructive Pulmonary Disease (COPD), Hepatitis B] [2:Chronic Obstructive Pulmonary Disease (COPD), Hepatitis B] [3:Chronic Obstructive Pulmonary Disease (COPD), Hepatitis B] Date Acquired: [1:05/09/2019] [2:05/09/2019] [3:05/09/2019] Weeks of Treatment: [1:0] [2:0] [3:0] Wound Status: [1:Open] [2:Open] [3:Open] Measurements L x W x D 0.5x0.5x0.1 [2:2.2x3.6x0.1] [3:0.3x0.4x0.1] (cm) Area (cm) : [1:0.196] [2:6.22] [3:0.094] Volume (cm) : [1:0.02] [2:0.622] [3:0.009] % Reduction in Area: [1:0.00%] [2:0.00%] [3:0.00%] % Reduction in Volume:  0.00% [2:0.00%] [3:0.00%] Classification: [1:Category/Stage II] [2:Category/Stage II] [3:Category/Stage II] Exudate Amount: [1:Medium] [2:Medium] [3:Medium] Exudate Type: [1:Serosanguineous] [2:Serosanguineous] [3:Serosanguineous] Exudate Color: [1:red, brown] [2:red, brown] [3:red, brown] Wound Margin: [1:Flat and Intact] [2:Flat and Intact] [3:Flat and Intact] Granulation Amount: [1:Large (67-100%)] [2:Large (67-100%)] [3:Large (67-100%)] Granulation Quality: [1:Red] [2:Red] [3:Red] Necrotic Amount: [1:None Present (0%)] [2:None Present (0%)] [3:None Present (0%)] Exposed Structures: [1:Fat Layer (Subcutaneous Tissue) Exposed: Yes Fascia: No Tendon: No Muscle: No Joint: No Bone: No] [2:Fat Layer (Subcutaneous Tissue) Exposed: Yes Fascia: No Tendon: No Muscle: No Joint: No Bone: No] [3:Fat Layer (Subcutaneous Tissue) Exposed: Yes  Fascia: No Tendon: No Muscle: No Joint: No Bone: No] Epithelialization: [1:None] [2:None 4] [3:None 5] Photos: [2:No Photos No Photos] [3:No Photos] Wound Location: [  1:Left Gluteus] [2:Left, Proximal, Posterior Upper Leg] [3:Left, Posterior Upper Leg] Wounding Event: [1:Gradually Appeared] [2:Gradually Appeared] [3:Gradually Appeared] Primary Etiology: [1:Pressure Ulcer] [2:Pressure Ulcer] [3:Pressure Ulcer] Comorbid History: [1:Chronic Obstructive Pulmonary Disease (COPD), Hepatitis B] [2:Chronic Obstructive Pulmonary Disease (COPD), Hepatitis B] [3:Chronic Obstructive Pulmonary Disease (COPD), Hepatitis B] Date Acquired: [1:05/09/2019] [2:05/09/2019] [3:05/09/2019] Weeks of Treatment: [1:0] [2:0] [3:0] Wound Status: [1:Open] [2:Open] [3:Open] Measurements L x W x D [1:1.2x1x0.1] [2:0.6x0.7x0.1] [3:0.5x0.7x0.1] (cm) Area (cm) : [1:0.942] [2:0.33] [3:0.275] Volume (cm) : [1:0.094] [2:0.033] [3:0.027] % Reduction in Area: [1:0.00%] [2:0.00%] [3:0.00%] % Reduction in Volume: [1:0.00%] [2:0.00%] [3:0.00%] Classification: [1:Category/Stage II] [2:Category/Stage II]  [3:Category/Stage II] Exudate Amount: [1:Medium] [2:Medium] [3:Medium] Exudate Type: [1:Serosanguineous] [2:Serosanguineous] [3:Serosanguineous] Exudate Color: [1:red, brown] [2:red, brown] [3:red, brown] Wound Margin: [1:Flat and Intact] [2:Flat and Intact] [3:Flat and Intact] Granulation Amount: [1:Large (67-100%)] [2:Large (67-100%)] [3:Large (67-100%)] Granulation Quality: [1:Red] [2:Red] [3:Red] Necrotic Amount: [1:None Present (0%)] [2:None Present (0%)] [3:None Present (0%)] Exposed Structures: [1:Fat Layer (Subcutaneous Tissue) Exposed: Yes Fascia: No Tendon: No Muscle: No Joint: No Bone: No None] [2:Fat Layer (Subcutaneous Tissue) Exposed: Yes Fascia: No Tendon: No Muscle: No Joint: No Bone: No None] [3:Fat Layer (Subcutaneous Tissue)  Exposed: Yes Fascia: No Tendon: No Muscle: No Joint: No Bone: No None] Treatment Notes Electronic Signature(s) Signed: 07/25/2019 5:34:04 PM By: Baltazar Najjar MD Signed: 07/25/2019 6:02:31 PM By: Zandra Abts RN, BSN Entered By: Baltazar Najjar on 07/25/2019 16:39:11 -------------------------------------------------------------------------------- Multi-Disciplinary Care Plan Details Patient Name: Date of Service: Tammy Jarvis. 07/25/2019 2:45 PM Medical Record [id number] Patient Account Number: [id number] Date of Birth/Sex: Treating RN: November 21, 1960 (59 y.o. Wynelle Link Primary Care Nevaen Tredway: Quitman Livings Other Clinician: Referring Draper Gallon: Treating Annmargaret Decaprio/Extender:Robson, Toula Moos, SAMI Weeks in Treatment: 0 Active Inactive Abuse / Safety / Falls / Self Care Management Nursing Diagnoses: Potential for falls Potential for injury related to falls Goals: Patient will remain injury free related to falls Date Initiated: 07/25/2019 Target Resolution Date: 08/26/2019 Goal Status: Active Patient/caregiver will verbalize/demonstrate measures taken to prevent injury and/or falls Date Initiated: 07/25/2019 Target  Resolution Date: 08/26/2019 Goal Status: Active Interventions: Assess Activities of Daily Living upon admission and as needed Assess fall risk on admission and as needed Assess: immobility, friction, shearing, incontinence upon admission and as needed Assess impairment of mobility on admission and as needed per policy Assess personal safety and home safety (as indicated) on admission and as needed Assess self care needs on admission and as needed Provide education on fall prevention Provide education on personal and home safety Notes: Pressure Nursing Diagnoses: Knowledge deficit related to causes and risk factors for pressure ulcer development Knowledge deficit related to management of pressures ulcers Potential for impaired tissue integrity related to pressure, friction, moisture, and shear Goals: Patient/caregiver will verbalize risk factors for pressure ulcer development Date Initiated: 07/25/2019 Target Resolution Date: 08/26/2019 Goal Status: Active Patient/caregiver will verbalize understanding of pressure ulcer management Date Initiated: 07/25/2019 Target Resolution Date: 08/26/2019 Goal Status: Active Interventions: Assess: immobility, friction, shearing, incontinence upon admission and as needed Assess offloading mechanisms upon admission and as needed Assess potential for pressure ulcer upon admission and as needed Provide education on pressure ulcers Notes: Wound/Skin Impairment Nursing Diagnoses: Impaired tissue integrity Knowledge deficit related to ulceration/compromised skin integrity Goals: Patient/caregiver will verbalize understanding of skin care regimen Date Initiated: 07/25/2019 Target Resolution Date: 08/26/2019 Goal Status: Active Interventions: Assess patient/caregiver ability to obtain necessary supplies Assess patient/caregiver ability to perform ulcer/skin care regimen upon admission and as needed Assess ulceration(s) every visit  Provide education on  ulcer and skin care Notes: Electronic Signature(s) Signed: 07/25/2019 6:02:31 PM By: Zandra Abts RN, BSN Entered By: Zandra Abts on 07/25/2019 18:00:44 -------------------------------------------------------------------------------- Patient/Caregiver Education Details Patient Name: Date of Service: Tammy Jarvis 4/19/2021andnbsp2:45 PM Medical Record [id number] Patient Account Number: [id number] Date of Birth/Gender: Treating RN: [birth year] (58 y.o. Wynelle Link Primary Care Physician: Quitman Livings Other Clinician: Referring Physician: Treating Physician/Extender:Robson, Toula Moos, SAMI Weeks in Treatment: 0 Education Assessment Education Provided To: Patient Education Topics Provided Pressure: Methods: Explain/Verbal Responses: State content correctly Safety: Methods: Explain/Verbal Responses: State content correctly Wound/Skin Impairment: Methods: Explain/Verbal Responses: State content correctly Electronic Signature(s) Signed: 07/25/2019 6:02:31 PM By: Zandra Abts RN, BSN Entered By: Zandra Abts on 07/25/2019 18:00:56 -------------------------------------------------------------------------------- Wound Assessment Details Patient Name: Date of Service: Tammy Jarvis 07/25/2019 2:45 PM Medical Record [id number] Patient Account Number: [id number] Date of Birth/Sex: Treating RN: 26-Nov-1960 (59 y.o. Freddy Finner Primary Care Lilyahna Sirmon: Quitman Livings Other Clinician: Referring Cesar Alf: Treating Annaliyah Willig/Extender:Robson, Toula Moos, SAMI Weeks in Treatment: 0 Wound Status Wound Number: 1 Primary Pressure Ulcer Etiology: Wound Location: Right, Proximal Gluteus Wound Open Wounding Event: Gradually Appeared Status: Date Acquired: 05/09/2019 Comorbid Chronic Obstructive Pulmonary Disease Weeks Of Treatment: 0 History: (COPD), Hepatitis B Clustered Wound: No Wound Measurements Length: (cm) 0.5 % Reduct Width: (cm) 0.5  % Reduct Depth: (cm) 0.1 Epitheli Area: (cm) 0.196 Tunneli Volume: (cm) 0.02 Undermi Wound Description Classification: Category/Stage II Wound Margin: Flat and Intact Exudate Amount: Medium Exudate Type: Serosanguineous Exudate Color: red, brown Wound Bed Granulation Amount: Large (67-100%) Granulation Quality: Red Necrotic Amount: None Present (0%) Foul Odor After Cleansing: No Slough/Fibrino No Exposed Structure Fascia Exposed: No Fat Layer (Subcutaneous Tissue) Exposed: Yes Tendon Exposed: No Muscle Exposed: No Joint Exposed: No Bone Exposed: No ion in Area: 0% ion in Volume: 0% alization: None ng: No ning: No Treatment Notes Wound #1 (Right, Proximal Gluteus) 1. Cleanse With Wound Cleanser 2. Periwound Care Skin Prep 3. Primary Dressing Applied Other primary dressing (specifiy in notes) Notes duoderm as primary and secondary. explained the orders and dressings. patient in agreement. Electronic Signature(s) Signed: 07/25/2019 6:02:31 PM By: Zandra Abts RN, BSN Signed: 07/26/2019 5:39:56 PM By: Yevonne Pax RN Entered By: Zandra Abts on 07/25/2019 16:31:49 -------------------------------------------------------------------------------- Wound Assessment Details Patient Name: Date of Service: JULIE-ANN, VANMAANEN 07/25/2019 2:45 PM Medical Record [id number] Patient Account Number: [id number] Date of Birth/Sex: Treating RN: January 30, 1961 (59 y.o. Freddy Finner Primary Care Harrol Novello: Quitman Livings Other Clinician: Referring Cowen Pesqueira: Treating Mina Carlisi/Extender:Robson, Toula Moos, SAMI Weeks in Treatment: 0 Wound Status Wound Number: 2 Primary Pressure Ulcer Etiology: Wound Location: Right Gluteus Wound Open Wounding Event: Gradually Appeared Status: Date Acquired: 05/09/2019 Comorbid Chronic Obstructive Pulmonary Disease Weeks Of Treatment: 0 History: (COPD), Hepatitis B Clustered Wound: No Wound Measurements Length: (cm) 2.2 %  Reduct Width: (cm) 3.6 % Reduct Depth: (cm) 0.1 Epitheli Area: (cm) 6.22 Tunneli Volume: (cm) 0.622 Undermi Wound Description Classification: Category/Stage II Wound Margin: Flat and Intact Exudate Amount: Medium Exudate Type: Serosanguineous Exudate Color: red, brown Wound Bed Granulation Amount: Large (67-100%) Granulation Quality: Red Necrotic Amount: None Present (0%) Foul Odor After Cleansing: No Slough/Fibrino No Exposed Structure Fascia Exposed: No Fat Layer (Subcutaneous Tissue) Exposed: Yes Tendon Exposed: No Muscle Exposed: No Joint Exposed: No Bone Exposed: No ion in Area: 0% ion in Volume: 0% alization: None ng: No ning: No Treatment Notes Wound #2 (Right Gluteus) 1. Cleanse With Wound Cleanser 2. Periwound Care Skin Prep 3. Primary Dressing Applied  Other primary dressing (specifiy in notes) Notes duoderm as primary and secondary. explained the orders and dressings. patient in agreement. Electronic Signature(s) Signed: 07/25/2019 6:02:31 PM By: Zandra Abts RN, BSN Signed: 07/26/2019 5:39:56 PM By: Yevonne Pax RN Entered By: Zandra Abts on 07/25/2019 16:32:02 -------------------------------------------------------------------------------- Wound Assessment Details Patient Name: Date of Service: SOLASH, TULLO 07/25/2019 2:45 PM Medical Record [id number] Patient Account Number: [id number] Date of Birth/Sex: Treating RN: 02/22/1961 (59 y.o. Freddy Finner Primary Care Raguel Kosloski: Quitman Livings Other Clinician: Referring Mileydi Milsap: Treating Shanelle Clontz/Extender:Robson, Toula Moos, SAMI Weeks in Treatment: 0 Wound Status Wound Number: 3 Primary Pressure Ulcer Etiology: Wound Location: Right, Distal Gluteus Wound Open Wounding Event: Gradually Appeared Status: Date Acquired: 05/09/2019 Comorbid Chronic Obstructive Pulmonary Disease Weeks Of Treatment: 0 History: (COPD), Hepatitis B Clustered Wound: No Wound Measurements Length:  (cm) 0.3 % Reduct Width: (cm) 0.4 % Reduct Depth: (cm) 0.1 Epitheli Area: (cm) 0.094 Tunneli Volume: (cm) 0.009 Undermi Wound Description Classification: Category/Stage II Wound Margin: Flat and Intact Exudate Amount: Medium Exudate Type: Serosanguineous Exudate Color: red, brown Wound Bed Granulation Amount: Large (67-100%) Granulation Quality: Red Necrotic Amount: None Present (0%) Foul Odor After Cleansing: No Slough/Fibrino No Exposed Structure Fascia Exposed: No Fat Layer (Subcutaneous Tissue) Exposed: Yes Tendon Exposed: No Muscle Exposed: No Joint Exposed: No Bone Exposed: No ion in Area: 0% ion in Volume: 0% alization: None ng: No ning: No Treatment Notes Wound #3 (Right, Distal Gluteus) 1. Cleanse With Wound Cleanser 2. Periwound Care Skin Prep 3. Primary Dressing Applied Other primary dressing (specifiy in notes) Notes duoderm as primary and secondary. explained the orders and dressings. patient in agreement. Electronic Signature(s) Signed: 07/25/2019 6:02:31 PM By: Zandra Abts RN, BSN Signed: 07/26/2019 5:39:56 PM By: Yevonne Pax RN Entered By: Zandra Abts on 07/25/2019 16:32:13 -------------------------------------------------------------------------------- Wound Assessment Details Patient Name: Date of Service: ROOPA, GRAVER 07/25/2019 2:45 PM Medical Record [id number] Patient Account Number: [id number] Date of Birth/Sex: Treating RN: [birth year] (59 y.o. Freddy Finner Primary Care Javanni Maring: Quitman Livings Other Clinician: Referring Kyna Blahnik: Treating Kol Consuegra/Extender:Robson, Toula Moos, SAMI Weeks in Treatment: 0 Wound Status Wound Number: 4 Primary Pressure Ulcer Etiology: Wound Location: Left Gluteus Wound Open Wounding Event: Gradually Appeared Status: Date Acquired: 05/09/2019 Comorbid Chronic Obstructive Pulmonary Disease Weeks Of Treatment: 0 History: (COPD), Hepatitis B Clustered Wound: No Wound  Measurements Length: (cm) 1.2 % Reducti Width: (cm) 1 % Reducti Depth: (cm) 0.1 Epithelia Area: (cm) 0.942 Tunnelin Volume: (cm) 0.094 Undermin Wound Description Classification: Category/Stage II Wound Margin: Flat and Intact Exudate Amount: Medium Exudate Type: Serosanguineous Exudate Color: red, brown Wound Bed Granulation Amount: Large (67-100%) Granulation Quality: Red Necrotic Amount: None Present (0%) Foul Odor After Cleansing: No Slough/Fibrino No Exposed Structure Fascia Exposed: No Fat Layer (Subcutaneous Tissue) Exposed: Yes Tendon Exposed: No Muscle Exposed: No Joint Exposed: No Bone Exposed: No on in Area: 0% on in Volume: 0% lization: None g: No ing: No Treatment Notes Wound #4 (Left Gluteus) 1. Cleanse With Wound Cleanser 2. Periwound Care Skin Prep 3. Primary Dressing Applied Other primary dressing (specifiy in notes) Notes duoderm as primary and secondary. explained the orders and dressings. patient in agreement. Electronic Signature(s) Signed: 07/25/2019 6:02:31 PM By: Zandra Abts RN, BSN Signed: 07/26/2019 5:39:56 PM By: Yevonne Pax RN Entered By: Zandra Abts on 07/25/2019 16:32:46 -------------------------------------------------------------------------------- Wound Assessment Details Patient Name: Date of Service: RAGINA, FENTER 07/25/2019 2:45 PM Medical Record [id number] Patient Account Number: [id number] Date of Birth/Sex: Treating RN: Apr 04, 1961 (59 y.o. F) Epps, Carrie Primary  Care Briarrose Shor: Quitman Livings Other Clinician: Referring Debanhi Blaker: Treating Jaycelyn Orrison/Extender:Robson, Toula Moos, SAMI Weeks in Treatment: 0 Wound Status Wound Number: 5 Primary Pressure Ulcer Etiology: Wound Location: Left, Proximal, Posterior Upper Leg Wound Open Wounding Event: Gradually Appeared Status: Date Acquired: 05/09/2019 Comorbid Chronic Obstructive Pulmonary Disease Weeks Of Treatment: 0 History: (COPD), Hepatitis  B Clustered Wound: No Wound Measurements Length: (cm) 0.6 % Reduc Width: (cm) 0.7 % Reduc Depth: (cm) 0.1 Epithel Area: (cm) 0.33 Volume: (cm) 0.033 Wound Description Classification: Category/Stage II Wound Margin: Flat and Intact Exudate Amount: Medium Exudate Type: Serosanguineous Exudate Color: red, brown Wound Bed Granulation Amount: Large (67-100%) Granulation Quality: Red Necrotic Amount: None Present (0%) Foul Odor After Cleansing: No Slough/Fibrino No Exposed Structure Fascia Exposed: No Fat Layer (Subcutaneous Tissue) Exposed: Yes Tendon Exposed: No Muscle Exposed: No Joint Exposed: No Bone Exposed: No tion in Area: 0% tion in Volume: 0% ialization: None Treatment Notes Wound #5 (Left, Proximal, Posterior Upper Leg) 1. Cleanse With Wound Cleanser 2. Periwound Care Skin Prep 3. Primary Dressing Applied Other primary dressing (specifiy in notes) Notes duoderm as primary and secondary. explained the orders and dressings. patient in agreement. Electronic Signature(s) Signed: 07/25/2019 6:02:31 PM By: Zandra Abts RN, BSN Signed: 07/26/2019 5:39:56 PM By: Yevonne Pax RN Entered By: Zandra Abts on 07/25/2019 16:32:58 -------------------------------------------------------------------------------- Wound Assessment Details Patient Name: Date of Service: IRIANA, ARTLEY 07/25/2019 2:45 PM Medical Record [id number] Patient Account Number: [id number] Date of Birth/Sex: Treating RN: June 25, 1960 (59 y.o. Freddy Finner Primary Care Nancey Kreitz: Quitman Livings Other Clinician: Referring Evian Salguero: Treating Breeonna Mone/Extender:Robson, Toula Moos, SAMI Weeks in Treatment: 0 Wound Status Wound Number: 6 Primary Pressure Ulcer Etiology: Wound Location: Left, Posterior Upper Leg Wound Open Wounding Event: Gradually Appeared Status: Date Acquired: 05/09/2019 Comorbid Chronic Obstructive Pulmonary Disease Weeks Of Treatment: 0 History: (COPD), Hepatitis  B Clustered Wound: No Wound Measurements Length: (cm) 0.5 % Reduct Width: (cm) 0.7 % Reduct Depth: (cm) 0.1 Epitheli Area: (cm) 0.275 Tunneli Volume: (cm) 0.027 Wound Description Classification: Category/Stage II Wound Margin: Flat and Intact Exudate Amount: Medium Exudate Type: Serosanguineous Exudate Color: red, brown Wound Bed Granulation Amount: Large (67-100%) Granulation Quality: Red Necrotic Amount: None Present (0%) Treatment Notes Wound #6 (Left, Posterior Upper Leg) 1. Cleanse With Wound Cleanser 2. Periwound Care Skin Prep 3. Primary Dressing Applied Other primary dressing (specifiy in notes) Foul Odor After Cleansing: No Slough/Fibrino No Exposed Structure Fascia Exposed: No Fat Layer (Subcutaneous Tissue) Exposed: Yes Tendon Exposed: No Muscle Exposed: No Joint Exposed: No Bone Exposed: No ion in Area: 0% ion in Volume: 0% alization: None ng: No Notes duoderm as primary and secondary. explained the orders and dressings. patient in agreement. Electronic Signature(s) Signed: 07/25/2019 6:02:31 PM By: Zandra Abts RN, BSN Signed: 07/26/2019 5:39:56 PM By: Yevonne Pax RN Entered By: Zandra Abts on 07/25/2019 16:33:13 -------------------------------------------------------------------------------- Vitals Details Patient Name: Date of Service: Tammy Jarvis 07/25/2019 2:45 PM Medical Record [id number] Patient Account Number: [id number] Date of Birth/Sex: Treating RN: 12-14-1960 (59 y.o. Freddy Finner Primary Care Rashauna Tep: Quitman Livings Other Clinician: Referring Darriana Deboy: Treating Yvett Rossel/Extender:Robson, Toula Moos, SAMI Weeks in Treatment: 0 Vital Signs Time Taken: 15:37 Temperature (F): 97.5 Height (in): 67 Pulse (bpm): 75 Source: Stated Respiratory Rate (breaths/min): 18 Weight (lbs): 268 Blood Pressure (mmHg): 124/84 Source: Stated Reference Range: 80 - 120 mg / dl Body Mass Index (BMI): 42 Electronic  Signature(s) Signed: 07/26/2019 5:39:56 PM By: Yevonne Pax RN Entered By: Yevonne Pax on 07/25/2019 15:27:42

## 2019-07-26 NOTE — Progress Notes (Signed)
Tammy Hines, Tammy Hines ([id number]) Visit Report for 07/25/2019 Abuse/Suicide Risk Screen Details Patient Name: Date of Service: Tammy Hines, Tammy Hines 07/25/2019 2:45 PM Medical Record Number:[id number] Patient Account Number: [id number] Date of Birth/Sex: Treating RN: [birth year] (59 y.o. Freddy Finner Primary Care Ora Mcnatt: Quitman Livings Other Clinician: Referring Edd Reppert: Treating Kailoni Vahle/Extender:Robson, Toula Moos, SAMI Weeks in Treatment: 0 Abuse/Suicide Risk Screen Items Answer ABUSE RISK SCREEN: Has anyone close to you tried to hurt or harm you recentlyo No Do you feel uncomfortable with anyone in your familyo No Has anyone forced you do things that you didnt want to doo No Electronic Signature(s) Signed: 07/26/2019 5:39:56 PM By: Yevonne Pax RN Entered By: Yevonne Pax on 07/25/2019 15:31:15 -------------------------------------------------------------------------------- Activities of Daily Living Details Patient Name: Date of Service: Tammy Hines, Tammy Hines 07/25/2019 2:45 PM Medical Record Number:[id number] Patient Account Number: [id number] Date of Birth/Sex: Treating RN: 18-Jun-1960 (59 y.o. Freddy Finner Primary Care Selassie Spatafore: Quitman Livings Other Clinician: Referring Derron Pipkins: Treating Isac Lincks/Extender:Robson, Toula Moos, SAMI Weeks in Treatment: 0 Activities of Daily Living Items Answer Activities of Daily Living (Please select one for each item) Drive Automobile Not Able Take Medications Completely Able Use Telephone Completely Able Care for Appearance Need Assistance Use Toilet Need Assistance Bath / Shower Need Assistance Dress Self Need Assistance Feed Self Completely Able Walk Not Able Get In / Out Bed Need Assistance Housework Not Able Prepare Meals Not Able Handle Money Completely Able Shop for Self Not Able Electronic Signature(s) Signed: 07/26/2019 5:39:56 PM By: Yevonne Pax RN Entered By: Yevonne Pax on 07/25/2019  15:32:41 -------------------------------------------------------------------------------- Education Screening Details Patient Name: Date of Service: Tammy Hines 07/25/2019 2:45 PM Medical Record Tammy Hines Patient Account Number: [id number] Date of Birth/Sex: Treating RN: June 27, 1960 (59 y.o. Freddy Finner Primary Care Lailanie Hasley: Quitman Livings Other Clinician: Referring Britiny Defrain: Treating Ernesteen Mihalic/Extender:Robson, Toula Moos, SAMI Weeks in Treatment: 0 Primary Learner Assessed: Patient Learning Preferences/Education Level/Primary Language Learning Preference: Explanation Highest Education Level: College or Above Preferred Language: English Cognitive Barrier Language Barrier: No Translator Needed: No Memory Deficit: No Emotional Barrier: No Cultural/Religious Beliefs Affecting Medical Care: No Physical Barrier Impaired Vision: No Impaired Hearing: No Decreased Hand dexterity: No Knowledge/Comprehension Knowledge Level: Medium Comprehension Level: High Ability to understand written High instructions: Ability to understand verbal High instructions: Motivation Anxiety Level: Calm Cooperation: Cooperative Education Importance: Acknowledges Need Interest in Health Problems: Asks Questions Perception: Coherent Willingness to Engage in Self- High Management Activities: Readiness to Engage in Self- High Management Activities: Electronic Signature(s) Signed: 07/26/2019 5:39:56 PM By: Yevonne Pax RN Entered By: Yevonne Pax on 07/25/2019 15:33:10 -------------------------------------------------------------------------------- Fall Risk Assessment Details Patient Name: Date of Service: Tammy Hines 07/25/2019 2:45 PM Medical Record [id number] Patient Account Number: [id number] Date of Birth/Sex: Treating RN: 29-Jul-1960 (59 y.o. Freddy Finner Primary Care Nejla Reasor: Quitman Livings Other Clinician: Referring Tima Curet: Treating  Katilin Raynes/Extender:Robson, Toula Moos, SAMI Weeks in Treatment: 0 Fall Risk Assessment Items Have you had 2 or more falls in the last 12 monthso 0 Yes Have you had any fall that resulted in injury in the last 12 monthso 0 Yes FALLS RISK SCREEN History of falling - immediate or within 3 months 25 Yes Secondary diagnosis (Do you have 2 or more medical diagnoseso) 0 No Ambulatory aid None/bed rest/wheelchair/nurse 0 No Crutches/cane/walker 0 No Furniture 0 No Intravenous therapy Access/Saline/Heparin Lock 0 No Weak (short steps with or without shuffle, stooped but able to lift head 0 No while walking, may seek support from furniture) Impaired (short steps with shuffle, may have  difficulty arising from chair, 0 No head down, impaired balance) Mental Status Oriented to own ability 0 No Overestimates or forgets limitations 0 No Risk Level: Medium Risk Score: 25 Electronic Signature(s) Signed: 07/26/2019 5:39:56 PM By: Carlene Coria RN Entered By: Carlene Coria on 07/25/2019 15:33:53 -------------------------------------------------------------------------------- Nutrition Risk Screening Details Patient Name: Date of Service: Tammy Hines, Tammy Hines 07/25/2019 2:45 PM Medical Record Number:[id number] Patient Account Number: [id number] Date of Birth/Sex: Treating RN: [birth year] (59 y.o. Orvan Falconer Primary Care Tammy Hines: Antonietta Jewel Other Clinician: Referring Abby Tucholski: Treating Govani Radloff/Extender:Robson, Luana Shu, SAMI Weeks in Treatment: 0 Height (in): 67 Weight (lbs): 268 Body Mass Index (BMI): 42 Nutrition Risk Screening Items Score Screening NUTRITION RISK SCREEN: I have an illness or condition that made me change the kind and/or 0 No amount of food I eat I eat fewer than two meals per day 0 No I eat few fruits and vegetables, or milk products 0 No I have three or more drinks of beer, liquor or wine almost every day 0 No I have tooth or mouth problems that make it  hard for me to eat 0 No I don't always have enough money to buy the food I need 0 No I eat alone most of the time 0 No I take three or more different prescribed or over-the-counter drugs a day 1 Yes 0 No Without wanting to, I have lost or gained 10 pounds in the last six months I am not always physically able to shop, cook and/or feed myself 2 Yes Nutrition Protocols Good Risk Protocol Provide education on Moderate Risk Protocol 0 nutrition High Risk Proctocol Risk Level: Moderate Risk Score: 3 Electronic Signature(s) Signed: 07/26/2019 5:39:56 PM By: Carlene Coria RN Entered By: Carlene Coria on 07/25/2019 15:34:12

## 2019-07-26 NOTE — Progress Notes (Signed)
Tammy, Hines ([id number]) Visit Report for 07/25/2019 Chief Complaint Document Details Patient Name: Date of Service: Tammy Hines, Tammy Hines 07/25/2019 2:45 PM Medical Record Number:[id number] Patient Account Number: [id number] Date of Birth/Sex: Treating RN: 01/10/1961 (59 y.o. Wynelle Link Primary Care Provider: Quitman Livings Other Clinician: Referring Provider: Treating Provider/Extender:Sailor Hevia, Toula Moos, SAMI Weeks in Treatment: 0 Information Obtained from: Patient Chief Complaint 07/25/2019; patient is here for review of wounds on the bilateral buttocks which are pressure area Electronic Signature(s) Signed: 07/25/2019 5:34:04 PM By: Baltazar Najjar MD Entered By: Baltazar Najjar on 07/25/2019 16:41:22 -------------------------------------------------------------------------------- Debridement Details Patient Name: Date of Service: Tammy Hines 07/25/2019 2:45 PM Medical Record [id number] Patient Account Number: [id number] Date of Birth/Sex: Treating RN: [birth year] (59 y.o. Wynelle Link Primary Care Provider: Quitman Livings Other Clinician: Referring Provider: Treating Provider/Extender:Shaelee Forni, Toula Moos, SAMI Weeks in Treatment: 0 Debridement Performed for Wound #1 Right,Proximal Gluteus Assessment: Performed By: Clinician Zandra Abts, RN Debridement Type: Chemical/Enzymatic/Mechanical Agent Used: wound cleanser and gauze Level of Consciousness (Pre- Awake and Alert procedure): Pre-procedure Verification/Time Out Taken: No Start Time: 16:45 Bleeding: None End Time: 16:45 Procedural Pain: 0 Post Procedural Pain: 0 Response to Treatment: Procedure was tolerated well Level of Consciousness Awake and Alert (Post-procedure): Post Debridement Measurements of Total Wound Length: (cm) 0.5 Stage: Category/Stage II Width: (cm) 0.5 Depth: (cm) 0.1 Volume: (cm) 0.02 Character of Wound/Ulcer Post Improved Debridement: Post Procedure  Diagnosis Same as Pre-procedure Electronic Signature(s) Signed: 07/25/2019 5:34:04 PM By: Baltazar Najjar MD Signed: 07/25/2019 6:02:31 PM By: Zandra Abts RN, BSN Entered By: Zandra Abts on 07/25/2019 17:01:20 -------------------------------------------------------------------------------- Debridement Details Patient Name: Date of Service: Tammy Hines 07/25/2019 2:45 PM Medical Record [id number] Patient Account Number: [id number] Date of Birth/Sex: Treating RN: 12-11-1960 (59 y.o. Wynelle Link Primary Care Provider: Quitman Livings Other Clinician: Referring Provider: Treating Provider/Extender:Aluna Whiston, Toula Moos, SAMI Weeks in Treatment: 0 Debridement Performed for Wound #2 Right Gluteus Assessment: Performed By: Clinician Zandra Abts, RN Debridement Type: Chemical/Enzymatic/Mechanical Agent Used: wound cleanser and gauze Level of Consciousness (Pre- Awake and Alert procedure): Pre-procedure Verification/Time Out Taken: No Start Time: 16:45 Bleeding: None End Time: 16:45 Procedural Pain: 0 Post Procedural Pain: 0 Response to Treatment: Procedure was tolerated well Level of Consciousness Awake and Alert (Post-procedure): Post Debridement Measurements of Total Wound Length: (cm) 2.2 Stage: Category/Stage II Width: (cm) 3.6 Depth: (cm) 0.1 Volume: (cm) 0.622 Character of Wound/Ulcer Post Improved Debridement: Post Procedure Diagnosis Same as Pre-procedure Electronic Signature(s) Signed: 07/25/2019 5:34:04 PM By: Baltazar Najjar MD Signed: 07/25/2019 6:02:31 PM By: Zandra Abts RN, BSN Entered By: Zandra Abts on 07/25/2019 17:01:39 -------------------------------------------------------------------------------- Debridement Details Patient Name: Date of Service: Tammy Hines 07/25/2019 2:45 PM Medical Record [id number] Patient Account Number: [id number] Date of Birth/Sex: Treating RN: [birth year] (59 y.o. Wynelle Link Primary Care Provider: Quitman Livings Other Clinician: Referring Provider: Treating Provider/Extender:Leonor Darnell, Toula Moos, SAMI Weeks in Treatment: 0 Debridement Performed for Wound #3 Right,Distal Gluteus Assessment: Performed By: Clinician Zandra Abts, RN Debridement Type: Chemical/Enzymatic/Mechanical Agent Used: wound cleanser and gauze Level of Consciousness (Pre- Awake and Alert procedure): Pre-procedure Verification/Time Out Taken: No Start Time: 16:45 Bleeding: None End Time: 16:45 Procedural Pain: 0 Post Procedural Pain: 0 Response to Treatment: Procedure was tolerated well Level of Consciousness Awake and Alert (Post-procedure): Post Debridement Measurements of Total Wound Length: (cm) 0.3 Stage: Category/Stage II Width: (cm) 0.4 Depth: (cm) 0.1 Volume: (cm) 0.009 Character of Wound/Ulcer Post Improved Debridement: Post Procedure Diagnosis Same as Pre-procedure Electronic Signature(s) Signed: 07/25/2019  5:34:04 PM By: Baltazar Najjar MD Signed: 07/25/2019 6:02:31 PM By: Zandra Abts RN, BSN Entered By: Zandra Abts on 07/25/2019 17:01:57 -------------------------------------------------------------------------------- Debridement Details Patient Name: Date of Service: Tammy, Hines 07/25/2019 2:45 PM Medical Record [id number] Patient Account Number: [id number] Date of Birth/Sex: Treating RN: May 11, 1960 (59 y.o. Wynelle Link Primary Care Provider: Quitman Livings Other Clinician: Referring Provider: Treating Provider/Extender:Arlone Lenhardt, Toula Moos, SAMI Weeks in Treatment: 0 Debridement Performed for Wound #4 Left Gluteus Assessment: Performed By: Clinician Zandra Abts, RN Debridement Type: Chemical/Enzymatic/Mechanical Agent Used: wound cleanser and gauze Level of Consciousness (Pre- Awake and Alert procedure): Pre-procedure No Verification/Time Out Taken: Start Time: 16:45 Bleeding: None End Time:  16:45 Procedural Pain: 0 Post Procedural Pain: 0 Response to Treatment: Procedure was tolerated well Level of Consciousness Awake and Alert (Post-procedure): Post Debridement Measurements of Total Wound Length: (cm) 1.2 Stage: Category/Stage II Width: (cm) 1 Depth: (cm) 0.1 Volume: (cm) 0.094 Character of Wound/Ulcer Post Improved Debridement: Post Procedure Diagnosis Same as Pre-procedure Electronic Signature(s) Signed: 07/25/2019 5:34:04 PM By: Baltazar Najjar MD Signed: 07/25/2019 6:02:31 PM By: Zandra Abts RN, BSN Entered By: Zandra Abts on 07/25/2019 17:02:20 -------------------------------------------------------------------------------- Debridement Details Patient Name: Date of Service: Tammy Hines 07/25/2019 2:45 PM Medical Record [id number] Patient Account Number: [id number] Date of Birth/Sex: Treating RN: [birth year] (59 y.o. Wynelle Link Primary Care Provider: Quitman Livings Other Clinician: Referring Provider: Treating Provider/Extender:Brodric Schauer, Toula Moos, SAMI Weeks in Treatment: 0 Debridement Performed for Wound #5 Left,Proximal,Posterior Upper Leg Assessment: Performed By: Clinician Zandra Abts, RN Debridement Type: Chemical/Enzymatic/Mechanical Agent Used: wound cleanser and gauze Level of Consciousness (Pre- Awake and Alert procedure): Pre-procedure No Verification/Time Out Taken: Start Time: 16:45 Bleeding: None End Time: 16:45 Procedural Pain: 0 Post Procedural Pain: 0 Response to Treatment: Procedure was tolerated well Level of Consciousness Awake and Alert (Post-procedure): Post Debridement Measurements of Total Wound Length: (cm) 0.6 Stage: Category/Stage II Width: (cm) 0.7 Depth: (cm) 0.1 Volume: (cm) 0.033 Character of Wound/Ulcer Post Improved Debridement: Post Procedure Diagnosis Same as Pre-procedure Electronic Signature(s) Signed: 07/25/2019 5:34:04 PM By: Baltazar Najjar MD Signed: 07/25/2019  6:02:31 PM By: Zandra Abts RN, BSN Entered By: Zandra Abts on 07/25/2019 17:02:37 -------------------------------------------------------------------------------- Debridement Details Patient Name: Date of Service: Tammy Hines 07/25/2019 2:45 PM Medical Record [id number] Patient Account Number: [id number] Date of Birth/Sex: Treating RN: [birth year] (59 y.o. Wynelle Link Primary Care Provider: Quitman Livings Other Clinician: Referring Provider: Treating Provider/Extender:Matheau Orona, Toula Moos, SAMI Weeks in Treatment: 0 Debridement Performed for Wound #6 Left,Posterior Upper Leg Assessment: Performed By: Clinician Zandra Abts, RN Debridement Type: Chemical/Enzymatic/Mechanical Agent Used: wound cleanser and gauze Level of Consciousness (Pre- Awake and Alert procedure): Pre-procedure No Verification/Time Out Taken: Start Time: 16:45 Bleeding: None End Time: 16:45 Procedural Pain: 0 Post Procedural Pain: 0 Response to Treatment: Procedure was tolerated well Level of Consciousness Awake and Alert (Post-procedure): Post Debridement Measurements of Total Wound Length: (cm) 0.5 Stage: Category/Stage II Width: (cm) 0.7 Depth: (cm) 0.1 Volume: (cm) 0.027 Character of Wound/Ulcer Post Improved Debridement: Post Procedure Diagnosis Same as Pre-procedure Electronic Signature(s) Signed: 07/25/2019 5:34:04 PM By: Baltazar Najjar MD Signed: 07/25/2019 6:02:31 PM By: Zandra Abts RN, BSN Entered By: Zandra Abts on 07/25/2019 17:02:58 -------------------------------------------------------------------------------- HPI Details Patient Name: Date of Service: Tammy Hines 07/25/2019 2:45 PM Medical Record [id number] Patient Account Number: [id number] Date of Birth/Sex: Treating RN: 1960-04-18 (59 y.o. Wynelle Link Primary Care Provider: Quitman Livings Other Clinician: Referring Provider: Treating Provider/Extender:Dvon Jiles,  Toula Moos, SAMI Weeks in Treatment: 0 History  of Present Illness HPI Description: ADMISSION 07/25/2019 This is a 59 year old woman with marked disability. She is only able to transfer from a chair to her wheelchair. The exact reason for this is not totally clear. She is listed in her problem list is having a cryptogenic stroke as well as severe left hip pain. The patient states that she sleeps in her recliner. She is incontinent of urine but is seeing [facility]. Was a recently discovered via CT scan of the abdomen and pelvis to have a 6 x 6 cm bladder mass. Over the last 3 months she has developed pressure ulcers x6 on her bilateral buttocks and left upper posterior thigh. The patient was seen I believe on at least 2 occasions at the wound care center in [facility] of [place]. They ordered her Hibiclens and Xeroform. Apparently to go there had an exorbitant co-pay through her insurance so she arrives here. Past medical history includes a cryptogenic stroke, COPD, depression, narcotic induced delirium, hypertension, chronic back pain, diastolic congestive heart failure, continued smoker, she is nonambulatory. She has marked urinary incontinence. Electronic Signature(s) Signed: 07/25/2019 5:34:04 PM By: Linton Ham MD Entered By: Linton Ham on 07/25/2019 16:43:46 -------------------------------------------------------------------------------- Physical Exam Details Patient Name: Date of Service: [name] 07/25/2019 2:45 PM Medical Record [id number] Patient Account Number: [id number] Date of Birth/Sex: Treating RN: November 28, 1960 (59 y.o. Nancy Fetter Primary Care Provider: Antonietta Jewel Other Clinician: Referring Provider: Treating Provider/Extender:Alainna Stawicki, Luana Shu, SAMI Weeks in Treatment: 0 Constitutional Sitting or standing Blood Pressure is within target range for patient.. Pulse regular and within target range for patient.Marland Kitchen  Respirations regular, non-labored and within target range.. Temperature is normal and within the target range for the patient.Marland Kitchen Appears in no distress. Respiratory work of breathing is normal. Bilateral breath sounds are clear and equal in all lobes with no wheezes, rales or rhonchi.. Cardiovascular Heart rhythm and rate regular, without murmur or gallop.. Gastrointestinal (GI) Morbidly obese. Mid abdominal hernia nontender. Psychiatric appears at normal baseline. Notes Wound exam; the patient has 3 wounds on the right buttock. All of these stage II pressure ulcers. The largest one is in the middle 2 small areas 1 inferiorly and 1 superiorly. On the left there is a small pressure ulcer. There is also some superficial areas just below the gluteal fold on the left posterior thigh. None of these looks infected there is no drainage no surrounding erythema Electronic Signature(s) Signed: 07/25/2019 5:34:04 PM By: Linton Ham MD Entered By: Linton Ham on 07/25/2019 17:09:41 -------------------------------------------------------------------------------- Physician Orders Details Patient Name: Date of Service: [name] 07/25/2019 2:45 PM Medical Record [id number] Patient Account Number: [id number] Date of Birth/Sex: Treating RN: [birth year] (59 y.o. Nancy Fetter Primary Care Provider: Antonietta Jewel Other Clinician: Referring Provider: Treating Provider/Extender:Cartrell Bentsen, Luana Shu, SAMI Weeks in Treatment: 0 Verbal / Phone Orders: No Diagnosis Coding Follow-up Appointments Return Appointment in 2 weeks. Skin Barriers/Peri-Wound Care Skin Prep - all wounds Wound Cleansing May shower and wash wound with soap and water. Primary Wound Dressing Wound #1 Right,Proximal Gluteus Other: - Duoderm Wound #2 Right Gluteus Other: - Duoderm Wound #3 Right,Distal Gluteus Other: - Duoderm Wound #4 Left Gluteus Other: - Duoderm Wound #5 Left,Proximal,Posterior  Upper Leg Other: - Duoderm Wound #6 Left,Posterior Upper Leg Other: - Duoderm Off-Loading Turn and reposition every 2 hours Electronic Signature(s) Signed: 07/25/2019 5:34:04 PM By: Linton Ham MD Signed: 07/25/2019 6:02:31 PM By: Levan Hurst RN, BSN Entered By: Levan Hurst on 07/25/2019 16:34:27 -------------------------------------------------------------------------------- Problem List  Details Patient Name: Date of Service: Tammy JarvisSTRIPE, Miroslava K. 07/25/2019 2:45 PM Medical Record Number:[id number] Patient Account Number: [id number] Date of Birth/Sex: Treating RN: March 25, 1961 (59 y.o. 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 07/25/2019 2:45 PM Medical Record [id number] Patient Account Number: [id number] Date of Birth/Sex: Treating RN: March 25, 1961 (59 y.o. Wynelle LinkF) Lynch, Shatara Primary Care Provider: Quitman LivingsHASSAN, SAMI Other Clinician: Referring Provider: Treating Provider/Extender:Chrissa Meetze, Toula MoosMichael HASSAN, SAMI Weeks in Treatment: 0 Subjective Chief Complaint Information obtained from Patient 07/25/2019; patient is here for review of wounds on the bilateral buttocks which are pressure area History of Present Illness  (HPI) ADMISSION 07/25/2019 This is a 59 year old woman with marked disability. She is only able to transfer from a chair to her wheelchair. The exact reason for this is not totally clear. She is listed in her problem list is having a cryptogenic stroke as well as severe left hip pain. The patient states that she sleeps in her recliner. She is incontinent of urine but is seeing [facility]. Was a recently discovered via CT scan of the abdomen and pelvis to have a 6 x 6 cm bladder mass. Over the last 3 months she has developed pressure ulcers x6 on her bilateral buttocks and left upper posterior thigh. The patient was seen I believe on at least 2 occasions at the wound care center [facility] [place] [facility] They ordered her Hibiclens and Xeroform. Apparently to go there had an exorbitant co-pay through her insurance so she arrives here. Past medical history includes a cryptogenic stroke, COPD, depression, narcotic induced delirium, hypertension, chronic back pain, diastolic congestive heart failure, continued smoker, she is nonambulatory. She has marked urinary incontinence. Patient History Information obtained from Patient. Allergies ampicillin (Severity: Moderate, Reaction: hives, nausea/vomiting), clindamycin (Severity: Severe, Reaction: "deathly sick"), clonazepam (Reaction: unknown), penicillin (Reaction: unknown) Family History Cancer - Father, Heart Disease - Mother, Hypertension - Mother, No family history of Diabetes, Hereditary Spherocytosis, Kidney Disease, Lung Disease, Seizures, Stroke, Thyroid Problems, Tuberculosis. Social History Current every day smoker, Marital Status - Single, Alcohol Use - Rarely, Drug Use - No History, Caffeine Use - Daily. Medical History Eyes Denies history of Cataracts, Glaucoma, Optic Neuritis Ear/Nose/Mouth/Throat Denies history of Chronic sinus problems/congestion, Middle ear problems Hematologic/Lymphatic Denies history of Anemia,  Hemophilia, Human Immunodeficiency Virus, Lymphedema, Sickle Cell Disease Respiratory Patient has history of Chronic Obstructive Pulmonary Disease (COPD) Denies history of Aspiration, Asthma, Pneumothorax, Sleep Apnea, Tuberculosis Cardiovascular Denies history of Angina, Arrhythmia, Congestive Heart Failure, Coronary Artery Disease, Deep Vein Thrombosis, Hypertension, Hypotension, Myocardial Infarction, Peripheral Arterial Disease, Peripheral Venous Disease, Phlebitis, Vasculitis Gastrointestinal Patient has history of Hepatitis B Denies history of Cirrhosis , Colitis, Crohnoos, Hepatitis A, Hepatitis C Endocrine Denies history of Type I Diabetes, Type II Diabetes Genitourinary Denies history of End Stage Renal Disease Immunological Denies history of Lupus Erythematosus, Raynaudoos, Scleroderma Integumentary (Skin) Denies history of History of Burn Musculoskeletal Denies history of Gout, Rheumatoid Arthritis, Osteoarthritis, Osteomyelitis Neurologic Denies history  of Dementia, Neuropathy, Quadriplegia, Paraplegia, Seizure Disorder Oncologic Denies history of Received Chemotherapy, Received Radiation Psychiatric Denies history of Anorexia/bulimia, Confinement Anxiety Review of Systems (ROS) Constitutional Symptoms (General Health) Denies complaints or symptoms of Fatigue, Fever, Chills, Marked Weight Change. Eyes Denies complaints or symptoms of Dry Eyes, Vision Changes, Glasses / Contacts. Ear/Nose/Mouth/Throat Denies complaints or symptoms of Chronic sinus problems or rhinitis. Respiratory Denies complaints or symptoms of Chronic or frequent coughs, Shortness of Breath. Cardiovascular Denies complaints or symptoms of Chest pain. Gastrointestinal Denies complaints or symptoms of Frequent diarrhea, Nausea, Vomiting. Endocrine Denies complaints or symptoms of Heat/cold intolerance. Genitourinary Denies complaints or symptoms of Frequent urination. Integumentary  (Skin) Complains or has symptoms of Wounds. Musculoskeletal Denies complaints or symptoms of Muscle Pain, Muscle Weakness. Neurologic Denies complaints or symptoms of Numbness/parasthesias. Psychiatric Denies complaints or symptoms of Claustrophobia, Suicidal. Objective Constitutional Sitting or standing Blood Pressure is within target range for patient.. Pulse regular and within target range for patient.Marland Kitchen Respirations regular, non-labored and within target range.. Temperature is normal and within the target range for the patient.Marland Kitchen Appears in no distress. Vitals Time Taken: 3:37 PM, Height: 67 in, Source: Stated, Weight: 268 lbs, Source: Stated, BMI: 42, Temperature: 97.5 F, Pulse: 75 bpm, Respiratory Rate: 18 breaths/min, Blood Pressure: 124/84 mmHg. Respiratory work of breathing is normal. Bilateral breath sounds are clear and equal in all lobes with no wheezes, rales or rhonchi.. Cardiovascular Heart rhythm and rate regular, without murmur or gallop.. Gastrointestinal (GI) Morbidly obese. Mid abdominal hernia nontender. Psychiatric appears at normal baseline. General Notes: Wound exam; the patient has 3 wounds on the right buttock. All of these stage II pressure ulcers. The largest one is in the middle 2 small areas 1 inferiorly and 1 superiorly. On the left there is a small pressure ulcer. There is also some superficial areas just below the gluteal fold on the left posterior thigh. None of these looks infected there is no drainage no surrounding erythema Integumentary (Hair, Skin) Wound #1 status is Open. Original cause of wound was Gradually Appeared. The wound is located on the Right,Proximal Gluteus. The wound measures 0.5cm length x 0.5cm width x 0.1cm depth; 0.196cm^2 area and 0.02cm^3 volume. There is Fat Layer (Subcutaneous Tissue) Exposed exposed. There is no tunneling or undermining noted. There is a medium amount of serosanguineous drainage noted. The wound margin is  flat and intact. There is large (67-100%) red granulation within the wound bed. There is no necrotic tissue within the wound bed. Wound #2 status is Open. Original cause of wound was Gradually Appeared. The wound is located on the Right Gluteus. The wound measures 2.2cm length x 3.6cm width x 0.1cm depth; 6.22cm^2 area and 0.622cm^3 volume. There is Fat Layer (Subcutaneous Tissue) Exposed exposed. There is no tunneling or undermining noted. There is a medium amount of serosanguineous drainage noted. The wound margin is flat and intact. There is large (67-100%) red granulation within the wound bed. There is no necrotic tissue within the wound bed. Wound #3 status is Open. Original cause of wound was Gradually Appeared. The wound is located on the Right,Distal Gluteus. The wound measures 0.3cm length x 0.4cm width x 0.1cm depth; 0.094cm^2 area and 0.009cm^3 volume. There is Fat Layer (Subcutaneous Tissue) Exposed exposed. There is no tunneling or undermining noted. There is a medium amount of serosanguineous drainage noted. The wound margin is flat and intact. There is large (67-100%) red granulation within the wound bed. There is no necrotic tissue within the wound bed. Wound #  4 status is Open. Original cause of wound was Gradually Appeared. The wound is located on the Left Gluteus. The wound measures 1.2cm length x 1cm width x 0.1cm depth; 0.942cm^2 area and 0.094cm^3 volume. There is Fat Layer (Subcutaneous Tissue) Exposed exposed. There is no tunneling or undermining noted. There is a medium amount of serosanguineous drainage noted. The wound margin is flat and intact. There is large (67-100%) red granulation within the wound bed. There is no necrotic tissue within the wound bed. Wound #5 status is Open. Original cause of wound was Gradually Appeared. The wound is located on the Left,Proximal,Posterior Upper Leg. The wound measures 0.6cm length x 0.7cm width x 0.1cm depth; 0.33cm^2 area and  0.033cm^3 volume. There is Fat Layer (Subcutaneous Tissue) Exposed exposed. There is a medium amount of serosanguineous drainage noted. The wound margin is flat and intact. There is large (67-100%) red granulation within the wound bed. There is no necrotic tissue within the wound bed. Wound #6 status is Open. Original cause of wound was Gradually Appeared. The wound is located on the Left,Posterior Upper Leg. The wound measures 0.5cm length x 0.7cm width x 0.1cm depth; 0.275cm^2 area and 0.027cm^3 volume. There is Fat Layer (Subcutaneous Tissue) Exposed exposed. There is no tunneling noted. There is a medium amount of serosanguineous drainage noted. The wound margin is flat and intact. There is large (67- 100%) red granulation within the wound bed. There is no necrotic tissue within the wound bed. 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 There was a Chemical/Enzymatic/Mechanical debridement performed by Zandra Abts, RN.. Other agent used was wound cleanser and gauze. There was no bleeding. The procedure was tolerated well with a pain level of 0 throughout and a pain level of 0 following the procedure. Post Debridement Measurements: 0.5cm length x 0.5cm width x 0.1cm depth; 0.02cm^3 volume. Post debridement Stage noted as Category/Stage II. Character of Wound/Ulcer Post Debridement is improved. Post procedure Diagnosis Wound #1: Same as Pre-Procedure Wound #2 Pre-procedure diagnosis of Wound #2 is a Pressure Ulcer located on the Right Gluteus . There was a Chemical/Enzymatic/Mechanical debridement performed by Zandra Abts, RN.. Other agent used was wound cleanser and gauze. There was no bleeding. The procedure was tolerated  well with a pain level of 0 throughout and a pain level of 0 following the procedure. Post Debridement Measurements: 2.2cm length x 3.6cm width x 0.1cm depth; 0.622cm^3 volume. Post debridement Stage noted as Category/Stage II. Character of Wound/Ulcer Post Debridement is improved. Post procedure Diagnosis Wound #2: Same as Pre-Procedure Wound #3 Pre-procedure diagnosis of Wound #3 is a Pressure Ulcer located on the Right,Distal Gluteus . There was a Chemical/Enzymatic/Mechanical debridement performed by Zandra Abts, RN.. Other agent used was wound cleanser and gauze. There was no bleeding. The procedure was tolerated well with a pain level of 0 throughout and a pain level of 0 following the procedure. Post Debridement Measurements: 0.3cm length x 0.4cm width x 0.1cm depth; 0.009cm^3 volume. Post debridement Stage noted as Category/Stage II. Character of Wound/Ulcer Post Debridement is improved. Post procedure Diagnosis Wound #3: Same as Pre-Procedure Wound #4 Pre-procedure diagnosis of Wound #4 is a Pressure Ulcer located on the Left Gluteus . There was a Chemical/Enzymatic/Mechanical debridement performed by Zandra Abts, RN.. Other agent used was wound  cleanser and gauze. There was no bleeding. The procedure was tolerated well with a pain level of 0 throughout and a pain level of 0 following the procedure. Post Debridement Measurements: 1.2cm length x 1cm width x 0.1cm depth; 0.094cm^3 volume. Post debridement Stage noted as Category/Stage II. Character of Wound/Ulcer Post Debridement is improved. Post procedure Diagnosis Wound #4: Same as Pre-Procedure Wound #5 Pre-procedure diagnosis of Wound #5 is a Pressure Ulcer located on the Left,Proximal,Posterior Upper Leg . There was a Chemical/Enzymatic/Mechanical debridement performed by Zandra Abts, RN.. Other agent used was wound cleanser and gauze. There was no bleeding. The procedure was tolerated well with a pain level of  0 throughout and a pain level of 0 following the procedure. Post Debridement Measurements: 0.6cm length x 0.7cm width x 0.1cm depth; 0.033cm^3 volume. Post debridement Stage noted as Category/Stage II. Character of Wound/Ulcer Post Debridement is improved. Post procedure Diagnosis Wound #5: Same as Pre-Procedure Wound #6 Pre-procedure diagnosis of Wound #6 is a Pressure Ulcer located on the Left,Posterior Upper Leg . There was a Chemical/Enzymatic/Mechanical debridement performed by Zandra Abts, RN.. Other agent used was wound cleanser and gauze. There was no bleeding. The procedure was tolerated well with a pain level of 0 throughout and a pain level of 0 following the procedure. Post Debridement Measurements: 0.5cm length x 0.7cm width x 0.1cm depth; 0.027cm^3 volume. Post debridement Stage noted as Category/Stage II. Character of Wound/Ulcer Post Debridement is improved. Post procedure Diagnosis Wound #6: Same as Pre-Procedure Plan Follow-up Appointments: Return Appointment in 2 weeks. Skin Barriers/Peri-Wound Care: Skin Prep - all wounds Wound Cleansing: May shower and wash wound with soap and water. Primary Wound Dressing: Wound #1 Right,Proximal Gluteus: Other: - Duoderm Wound #2 Right Gluteus: Other: - Duoderm Wound #3 Right,Distal Gluteus: Other: - Duoderm Wound #4 Left Gluteus: Other: - Duoderm Wound #5 Left,Proximal,Posterior Upper Leg: Other: - Duoderm Wound #6 Left,Posterior Upper Leg: Other: - Duoderm Off-Loading: Turn and reposition every 2 hours 1. 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 2. The situation is complicated by a marked urinary incontinence. This apparently is necessitating her changing the dressing multiple times a day. Therefore they are now out of Xeroform and using Neosporin. We talked her about this in fact changing dressings multiple times a day will result in inability to supply material  through insurance companies no matter what we use 3. 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 I wonder if she would be a candidate for a Foley catheter. She has been cared for by Dr. Vernie Ammons downstairs I might reach out if the patient will agree to it. 4. She is markedly immobile. Cannot sleep in a bed because of back pain. She sleeps in a recliner and sits in a wheelchair. I am therefore very skeptical that these wounds will heal 5. We decided to use DuoDERM out of the list of very minimal options. 6. She also has [place] [facility] which is difficult to provide home health care. We will see her back in 2 weeks. I spent 40 minutes on review of this patient's records, face-to-face evaluation and preparation of this record Electronic Signature(s) Signed: 07/25/2019 5:34:04 PM By: Baltazar Najjar MD Entered By: Baltazar Najjar on 07/25/2019 17:12:15 -------------------------------------------------------------------------------- HxROS Details Patient Name: Date of Service: Tammy Hines 07/25/2019 2:45 PM Medical Record [id number] Patient Account Number: [id number] Date of Birth/Sex: Treating RN: 10/17/1960 (59 y.o. F) Epps, Lyla Son  Primary Care Provider: Quitman Livings Other Clinician: Referring Provider: Treating Provider/Extender:Heylee Tant, Toula Moos, SAMI Weeks in Treatment: 0 Information Obtained From Patient Constitutional Symptoms (General Health) Complaints and Symptoms: Negative for: Fatigue; Fever; Chills; Marked Weight Change Eyes Complaints and Symptoms: Negative for: Dry Eyes; Vision Changes; Glasses / Contacts Medical History: Negative for: Cataracts; Glaucoma; Optic Neuritis Ear/Nose/Mouth/Throat Complaints and Symptoms: Negative for: Chronic sinus problems or rhinitis Medical History: Negative for: Chronic sinus problems/congestion; Middle ear problems Respiratory Complaints and Symptoms: Negative for:  Chronic or frequent coughs; Shortness of Breath Medical History: Positive for: Chronic Obstructive Pulmonary Disease (COPD) Negative for: Aspiration; Asthma; Pneumothorax; Sleep Apnea; Tuberculosis Cardiovascular Complaints and Symptoms: Negative for: Chest pain Medical History: Negative for: Angina; Arrhythmia; Congestive Heart Failure; Coronary Artery Disease; Deep Vein Thrombosis; Hypertension; Hypotension; Myocardial Infarction; Peripheral Arterial Disease; Peripheral Venous Disease; Phlebitis; Vasculitis Gastrointestinal Complaints and Symptoms: Negative for: Frequent diarrhea; Nausea; Vomiting Medical History: Positive for: Hepatitis B Negative for: Cirrhosis ; Colitis; Crohns; Hepatitis A; Hepatitis C Endocrine Complaints and Symptoms: Negative for: Heat/cold intolerance Medical History: Negative for: Type I Diabetes; Type II Diabetes Genitourinary Complaints and Symptoms: Negative for: Frequent urination Medical History: Negative for: End Stage Renal Disease Integumentary (Skin) Complaints and Symptoms: Positive for: Wounds Medical History: Negative for: History of Burn Musculoskeletal Complaints and Symptoms: Negative for: Muscle Pain; Muscle Weakness Medical History: Negative for: Gout; Rheumatoid Arthritis; Osteoarthritis; Osteomyelitis Neurologic Complaints and Symptoms: Negative for: Numbness/parasthesias Medical History: Negative for: Dementia; Neuropathy; Quadriplegia; Paraplegia; Seizure Disorder Psychiatric Complaints and Symptoms: Negative for: Claustrophobia; Suicidal Medical History: Negative for: Anorexia/bulimia; Confinement Anxiety Hematologic/Lymphatic Medical History: Negative for: Anemia; Hemophilia; Human Immunodeficiency Virus; Lymphedema; Sickle Cell Disease Immunological Medical History: Negative for: Lupus Erythematosus; Raynauds; Scleroderma Oncologic Medical History: Negative for: Received Chemotherapy; Received  Radiation Immunizations Pneumococcal Vaccine: Received Pneumococcal Vaccination: No Implantable Devices None Family and Social History Cancer: Yes - Father; Diabetes: No; Heart Disease: Yes - Mother; Hereditary Spherocytosis: No; Hypertension: Yes - Mother; Kidney Disease: No; Lung Disease: No; Seizures: No; Stroke: No; Thyroid Problems: No; Tuberculosis: No; Current every day smoker; Marital Status - Single; Alcohol Use: Rarely; Drug Use: No History; Caffeine Use: Daily; Financial Concerns: No; Food, Clothing or Shelter Needs: No; Support System Lacking: No; Transportation Concerns: No Electronic Signature(s) Signed: 07/25/2019 5:34:04 PM By: Baltazar Najjar MD Signed: 07/26/2019 5:39:56 PM By: Yevonne Pax RN Entered By: Yevonne Pax on 07/25/2019 15:31:00 -------------------------------------------------------------------------------- SuperBill Details Patient Name: Date of Service: Tammy Hines 07/25/2019 Medical Record Number:[id number] Patient Account Number: [id number] Date of Birth/Sex: Treating RN: [birth year] (59 y.o. 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 Facility Procedures CPT4 Code: [id number] Description: 99215 - WOUND CARE VISIT-LEV 5 EST PT Modifier: 25 Quantity: 1 CPT4 Code: [id number] Description: 09628 - DEBRIDE W/O ANES NON SELECT Modifier: Quantity: 1 Physician Procedures CPT4 Code Description: [id number] WC PHYS LEVEL 3 NEW PT ICD-10 Diagnosis Description L89.322 Pressure ulcer of left buttock, stage 2 L89.312 Pressure ulcer of right buttock, stage 2 L97.101 Non-pressure chronic ulcer of unspecified  thigh limi Modifier: ted to breakdown Quantity: 1 of skin Electronic Signature(s) Signed: 07/25/2019 6:02:31 PM By: Zandra Abts RN, BSN Signed: 07/26/2019 5:47:38 PM By: Baltazar Najjar MD Previous Signature: 07/25/2019 5:34:04 PM Version By: Baltazar Najjar  MD Entered By: Zandra Abts on 07/25/2019 18:02:11

## 2019-08-08 ENCOUNTER — Encounter (HOSPITAL_BASED_OUTPATIENT_CLINIC_OR_DEPARTMENT_OTHER): Payer: Medicare Other | Admitting: Internal Medicine

## 2019-08-17 ENCOUNTER — Encounter (HOSPITAL_BASED_OUTPATIENT_CLINIC_OR_DEPARTMENT_OTHER): Payer: Medicare Other | Admitting: Physician Assistant

## 2019-09-07 ENCOUNTER — Encounter (HOSPITAL_BASED_OUTPATIENT_CLINIC_OR_DEPARTMENT_OTHER): Payer: Medicare Other | Attending: Physician Assistant | Admitting: Physician Assistant

## 2019-09-07 DIAGNOSIS — L89322 Pressure ulcer of left buttock, stage 2: Secondary | ICD-10-CM | POA: Insufficient documentation

## 2019-09-07 DIAGNOSIS — L89312 Pressure ulcer of right buttock, stage 2: Secondary | ICD-10-CM | POA: Insufficient documentation

## 2019-09-07 DIAGNOSIS — Z8673 Personal history of transient ischemic attack (TIA), and cerebral infarction without residual deficits: Secondary | ICD-10-CM | POA: Insufficient documentation

## 2019-09-07 DIAGNOSIS — L97121 Non-pressure chronic ulcer of left thigh limited to breakdown of skin: Secondary | ICD-10-CM | POA: Diagnosis not present

## 2019-09-07 NOTE — Progress Notes (Addendum)
RAPHAEL, ESPE ([id number]) Visit Report for 09/07/2019 Chief Complaint Document Details Patient Name: Date of Service: CAMERYN, SCHUM 09/07/2019 3:15 PM Medical Record Number: [id number] Patient Account Number: [id number] Date of Birth/Sex: Treating RN: 11/22/1960 (59 y.o. Elam Dutch Primary Care Provider: HA Reather Littler, [place] Other Clinician: Referring Provider: Treating Provider/Extender: Worthy Keeler HA SSA N, SA [place] Weeks in Treatment: 6 Information Obtained from: Patient Chief Complaint 07/25/2019; patient is here for review of wounds on the bilateral buttocks which are pressure area Electronic Signature(s) Signed: 09/07/2019 3:53:33 PM By: Worthy Keeler PA-C Entered By: Worthy Keeler on 09/07/2019 15:53:33 -------------------------------------------------------------------------------- HPI Details Patient Name: Date of Service: JAVON, HUPFER 09/07/2019 3:15 PM Medical Record Number: [id number] Patient Account Number: [id number] Date of Birth/Sex: Treating RN: 03-06-1961 (59 y.o. Elam Dutch Primary Care Provider: HA Reather Littler, [place] Other Clinician: Referring Provider: Treating Provider/Extender: Worthy Keeler HA SSA N, SA [place] Weeks in Treatment: 6 History of Present Illness HPI Description: ADMISSION 07/25/2019 This is a 59 year old woman with marked disability. She is only able to transfer from a chair to her wheelchair. The exact reason for this is not totally clear. She is listed in her problem list is having a cryptogenic stroke as well as severe left hip pain. The patient states that she sleeps in her recliner. She is incontinent of urine but is seeing [facility]. Was a recently discovered via CT scan of the abdomen and pelvis to have a 6 x 6 cm bladder mass. Over the last 3 months she has developed pressure ulcers x6 on her bilateral buttocks and left upper posterior thigh. The patient was seen I believe on at least 2 occasions at the wound  care center in [facility] part of [place]. They ordered her Hibiclens and Xeroform. Apparently to go there had an exorbitant co-pay through her insurance so she arrives here. Past medical history includes a cryptogenic stroke, COPD, depression, narcotic induced delirium, hypertension, chronic back pain, diastolic congestive heart failure, continued smoker, she is nonambulatory. She has marked urinary incontinence. 09/07/2019 upon evaluation today patient appears to be doing very well in regard to her wounds. She has not been seen since her initial admission on April 19. With that being said fortunately her wounds have all healed except for just 1 which also seems to be doing well. I see no evidence of active infection overall very pleased and the patient likewise is pleased as well. She has been using the DuoDERM with Xeroform underneath. Electronic Signature(s) Signed: 09/07/2019 4:19:46 PM By: Worthy Keeler PA-C Entered By: Worthy Keeler on 09/07/2019 16:19:46 -------------------------------------------------------------------------------- Physical Exam Details Patient Name: Date of Service: CATALYNA, REILLY 09/07/2019 3:15 PM Medical Record Number: [id number] Patient Account Number: [id number] Date of Birth/Sex: Treating RN: Jul 07, 1960 (59 y.o. Elam Dutch Primary Care Provider: HA Reather Littler, [place] Other Clinician: Referring Provider: Treating Provider/Extender: Worthy Keeler HA SSA N, [place] Weeks in Treatment: 6 Constitutional Well-nourished and well-hydrated in no acute distress. Respiratory normal breathing without difficulty. Psychiatric this patient is able to make decisions and demonstrates good insight into disease process. Alert and Oriented x 3. pleasant and cooperative. Notes Upon inspection patient's wound bed actually showed signs of excellent improvement overall I am extremely pleased with how things seem to have progressed and the patient seems to be doing quite  well at this time. No fevers, chills, nausea, vomiting, or diarrhea. Electronic Signature(s) Signed: 09/07/2019 4:20:10 PM By:  Linwood Dibbles, Rosaleah Person PA-C Entered By: Lenda Kelp on 09/07/2019 16:20:09 -------------------------------------------------------------------------------- Physician Orders Details Patient Name: Date of Service: JISSELLE, POTH 09/07/2019 3:15 PM Medical Record Number: [id number] Patient Account Number: [id number] Date of Birth/Sex: Treating RN: March 17, 1961 (59 y.o. 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 Dressing Change Frequency Change dressing every day. Skin Barriers/Peri-Wound Care Skin Prep Wound Cleansing May shower and wash wound with soap and water. Primary Wound Dressing Wound #4 Left Gluteus Xeroform Secondary Dressing Other: - duoderm Off-Loading Turn and reposition every 2 hours Electronic Signature(s) Signed: 09/07/2019 4:50:50 PM By: Lenda Kelp PA-C Signed: 09/07/2019 5:52:33 PM By: Zenaida Deed RN, BSN Entered By: Zenaida Deed on 09/07/2019 16:19:57 -------------------------------------------------------------------------------- Problem List Details Patient Name: Date of Service: SIGNA, CHEEK 09/07/2019 3:15 PM Medical Record Number: [id number] Patient Account Number: [id number] Date of Birth/Sex: Treating RN: 1961-03-15 (59 y.o. 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 Tommye Standard Primary Care Provider: HA Ian Bushman, [place] Other Clinician: Referring Provider: Treating Provider/Extender: Lenda Kelp HA Ian Bushman, [place] Weeks in Treatment: 6 Subjective Chief Complaint Information obtained from Patient 07/25/2019; patient is here for review of wounds on the bilateral buttocks which are pressure area History of Present Illness (HPI) ADMISSION 07/25/2019 This is a 59 year old woman with marked disability. She is only able to transfer from a chair to her wheelchair. The exact reason for this is not totally clear. She is listed in her problem list is having a cryptogenic stroke as well as severe left hip pain. The patient states that she sleeps in her recliner. She is incontinent of urine but is seeing [facility]. Was a recently discovered via CT scan of the abdomen and pelvis to have a 6 x 6 cm bladder mass. Over the last 3 months she has developed pressure ulcers x6 on her bilateral buttocks and left upper posterior thigh. The patient was seen I believe on at least 2 occasions at the wound care center in [facility] part of [place]. They ordered her Hibiclens and  Xeroform. Apparently to go there had an exorbitant co-pay through her insurance so she arrives here. Past medical history includes a cryptogenic stroke, COPD, depression, narcotic induced delirium, hypertension, chronic back pain, diastolic congestive heart failure, continued smoker, she is nonambulatory. She has marked urinary incontinence. 09/07/2019 upon evaluation today patient appears to be doing  very well in regard to her wounds. She has not been seen since her initial admission on April 19. With that being said fortunately her wounds have all healed except for just 1 which also seems to be doing well. I see no evidence of active infection overall very pleased and the patient likewise is pleased as well. She has been using the DuoDERM with Xeroform underneath. Objective Constitutional Well-nourished and well-hydrated in no acute distress. Vitals Time Taken: 3:52 PM, Height: 67 in, Weight: 268 lbs, BMI: 42, Temperature: 98.5 F, Pulse: 70 bpm, Respiratory Rate: 18 breaths/min, Blood Pressure: 139/85 mmHg. Respiratory normal breathing without difficulty. Psychiatric this patient is able to make decisions and demonstrates good insight into disease process. Alert and Oriented x 3. pleasant and cooperative. General Notes: Upon inspection patient's wound bed actually showed signs of excellent improvement overall I am extremely pleased with how things seem to have progressed and the patient seems to be doing quite well at this time. No fevers, chills, nausea, vomiting, or diarrhea. Integumentary (Hair, Skin) Wound #1 status is Healed - Epithelialized. Original cause of wound was Gradually Appeared. The wound is located on the Right,Proximal Gluteus. The wound measures 0cm length x 0cm width x 0cm depth; 0cm^2 area and 0cm^3 volume. Wound #2 status is Healed - Epithelialized. Original cause of wound was Gradually Appeared. The wound is located on the Right Gluteus. The wound measures 0cm length x  0cm width x 0cm depth; 0cm^2 area and 0cm^3 volume. Wound #3 status is Healed - Epithelialized. Original cause of wound was Gradually Appeared. The wound is located on the Right,Distal Gluteus. The wound measures 0cm length x 0cm width x 0cm depth; 0cm^2 area and 0cm^3 volume. Wound #4 status is Open. Original cause of wound was Gradually Appeared. The wound is located on the Left Gluteus. The wound measures 0.5cm length x 0.5cm width x 0.1cm depth; 0.196cm^2 area and 0.02cm^3 volume. There is Fat Layer (Subcutaneous Tissue) Exposed exposed. There is no tunneling or undermining noted. There is a medium amount of serosanguineous drainage noted. The wound margin is flat and intact. There is large (67-100%) red granulation within the wound bed. There is no necrotic tissue within the wound bed. Wound #5 status is Healed - Epithelialized. Original cause of wound was Gradually Appeared. The wound is located on the Left,Proximal,Posterior Upper Leg. The wound measures 0cm length x 0cm width x 0cm depth; 0cm^2 area and 0cm^3 volume. Wound #6 status is Healed - Epithelialized. Original cause of wound was Gradually Appeared. The wound is located on the Left,Posterior Upper Leg. The wound measures 0cm length x 0cm width x 0cm depth; 0cm^2 area and 0cm^3 volume. 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 Dressing Change Frequency: Change dressing every day. Skin Barriers/Peri-Wound Care: Skin Prep Wound Cleansing: May shower and wash wound with soap and water. Primary Wound Dressing: Wound #4 Left Gluteus: Xeroform Secondary Dressing: Other: - duoderm Off-Loading: Turn and reposition every 2 hours 1. I would recommend currently that we continue with the current wound care measures that have  done so well for this is Xeroform gauze with DuoDERM over top and the patient is in agreement with plan. 2. Also recommend she needs to continue with appropriate offloading she should strive to prevent any longstanding pressure or friction to the gluteal region that is  what would be most detrimental for her. 3. I would also recommend that she continue to monitor for any signs of worsening infection although there is nothing going on at this point. We will see patient back for reevaluation in 2 weeks here in the clinic. If anything worsens or changes patient will contact our office for additional recommendations. Electronic Signature(s) Signed: 09/07/2019 4:21:14 PM By: Lenda Kelp PA-C Entered By: Lenda Kelp on 09/07/2019 16:21:13 -------------------------------------------------------------------------------- SuperBill Details Patient Name: Date of Service: TAMMATHA, COBB 09/07/2019 Medical Record Number: [id number] Patient Account Number: [id number] Date of Birth/Sex: Treating RN: 04/13/1960 (59 y.o. 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 Facility Procedures CPT4 Code: [id number] Description: 99214 - WOUND CARE VISIT-LEV 4 EST PT Modifier: Quantity: 1 Physician Procedures : CPT4 Code Description Modifier [id number] - WC PHYS LEVEL 3 - EST PT ICD-10 Diagnosis Description L89.312 Pressure ulcer of right buttock, stage 2 L89.322 Pressure ulcer of left buttock, stage 2 L97.101 Non-pressure chronic ulcer of unspecified  thigh limited to breakdown of skin Quantity: 1 Electronic Signature(s) Signed: 09/07/2019  4:21:26 PM By: Lenda Kelp PA-C Signed: 09/07/2019 4:21:26 PM By: Lenda Kelp PA-C Entered By: Lenda Kelp on 09/07/2019 16:21:26

## 2019-09-08 NOTE — Progress Notes (Signed)
Tammy, Hines ([id number]) Visit Report for 09/07/2019 Arrival Information Details Patient Name: Date of Service: Tammy Hines, Tammy Hines 09/07/2019 3:15 PM Medical Record Number: [id number] Patient Account Number: [id number] Date of Birth/Sex: Treating RN: 24-Feb-1961 (59 y.o. Tammy Hines, Tammy Hines Primary Care Wojciech Willetts: HA Reather Littler, SA [place] Other Clinician: Referring Evangeline Utley: Treating Arturo Sofranko/Extender: Worthy Keeler HA SSA N, SA [place] Weeks in Treatment: 6 Visit Information History Since Last Visit Added or deleted any medications: No Patient Arrived: Wheel Chair Any new allergies or adverse reactions: No Arrival Time: 15:50 Had a fall or experienced change in No Accompanied By: friend activities of daily living that may affect Transfer Assistance: None risk of falls: Patient Identification Verified: Yes Signs or symptoms of abuse/neglect since last visito No Secondary Verification Process Completed: Yes Hospitalized since last visit: No Patient Requires Transmission-Based Precautions: No Implantable device outside of the clinic excluding No Patient Has Alerts: No cellular tissue based products placed in the center since last visit: Has Dressing in Place as Prescribed: Yes Pain Present Now: Yes Electronic Signature(s) Signed: 09/08/2019 9:12:52 AM By: Sandre Kitty Entered By: Sandre Kitty on 09/07/2019 15:51:30 -------------------------------------------------------------------------------- Clinic Level of Care Assessment Details Patient Name: Date of Service: Tammy, Hines 09/07/2019 3:15 PM Medical Record Number: [id number] Patient Account Number: [id number] Date of Birth/Sex: Treating RN: Jan 27, 1961 (59 y.o. Elam Dutch Primary Care Charl Wellen: HA Reather Littler, SA [place] Other Clinician: Referring Laqueena Hinchey: Treating Jayesh Marbach/Extender: Worthy Keeler HA SSA N, SA [place] Weeks in Treatment: 6 Clinic Level of Care Assessment Items TOOL 4 Quantity Score '[]'  - 0 Use when only  an EandM is performed on FOLLOW-UP visit ASSESSMENTS - Nursing Assessment / Reassessment X- 1 10 Reassessment of Co-morbidities (includes updates in patient status) X- 1 5 Reassessment of Adherence to Treatment Plan ASSESSMENTS - Wound and Skin A ssessment / Reassessment '[]'  - 0 Simple Wound Assessment / Reassessment - one wound X- 6 5 Complex Wound Assessment / Reassessment - multiple wounds '[]'  - 0 Dermatologic / Skin Assessment (not related to wound area) ASSESSMENTS - Focused Assessment '[]'  - 0 Circumferential Edema Measurements - multi extremities '[]'  - 0 Nutritional Assessment / Counseling / Intervention '[]'  - 0 Lower Extremity Assessment (monofilament, tuning fork, pulses) '[]'  - 0 Peripheral Arterial Disease Assessment (using hand held doppler) ASSESSMENTS - Ostomy and/or Continence Assessment and Care '[]'  - 0 Incontinence Assessment and Management '[]'  - 0 Ostomy Care Assessment and Management (repouching, etc.) PROCESS - Coordination of Care X - Simple Patient / Family Education for ongoing care 1 15 '[]'  - 0 Complex (extensive) Patient / Family Education for ongoing care X- 1 10 Staff obtains Programmer, systems, Records, T Results / Process Orders est '[]'  - 0 Staff telephones HHA, Nursing Homes / Clarify orders / etc '[]'  - 0 Routine Transfer to another Facility (non-emergent condition) '[]'  - 0 Routine Hospital Admission (non-emergent condition) '[]'  - 0 New Admissions / Biomedical engineer / Ordering NPWT Apligraf, etc. , '[]'  - 0 Emergency Hospital Admission (emergent condition) X- 1 10 Simple Discharge Coordination '[]'  - 0 Complex (extensive) Discharge Coordination PROCESS - Special Needs '[]'  - 0 Pediatric / Minor Patient Management '[]'  - 0 Isolation Patient Management '[]'  - 0 Hearing / Language / Visual special needs '[]'  - 0 Assessment of Community assistance (transportation, D/C planning, etc.) '[]'  - 0 Additional assistance / Altered mentation '[]'  - 0 Support Surface(s)  Assessment (bed, cushion, seat, etc.) INTERVENTIONS - Wound Cleansing / Measurement '[]'  - 0 Simple Wound Cleansing - one wound  X- 6 5 Complex Wound Cleansing - multiple wounds X- 1 5 Wound Imaging (photographs - any number of wounds) '[]'  - 0 Wound Tracing (instead of photographs) X- 1 5 Simple Wound Measurement - one wound '[]'  - 0 Complex Wound Measurement - multiple wounds INTERVENTIONS - Wound Dressings X - Small Wound Dressing one or multiple wounds 1 10 '[]'  - 0 Medium Wound Dressing one or multiple wounds '[]'  - 0 Large Wound Dressing one or multiple wounds X- 1 5 Application of Medications - topical '[]'  - 0 Application of Medications - injection INTERVENTIONS - Miscellaneous '[]'  - 0 External ear exam '[]'  - 0 Specimen Collection (cultures, biopsies, blood, body fluids, etc.) '[]'  - 0 Specimen(s) / Culture(s) sent or taken to Lab for analysis '[]'  - 0 Patient Transfer (multiple staff / Civil Service fast streamer / Similar devices) '[]'  - 0 Simple Staple / Suture removal (25 or less) '[]'  - 0 Complex Staple / Suture removal (26 or more) '[]'  - 0 Hypo / Hyperglycemic Management (close monitor of Blood Glucose) '[]'  - 0 Ankle / Brachial Index (ABI) - do not check if billed separately X- 1 5 Vital Signs Has the patient been seen at the hospital within the last three years: Yes Total Score: 140 Level Of Care: New/Established - Level 4 Electronic Signature(s) Signed: 09/07/2019 5:52:33 PM By: Baruch Gouty RN, BSN Entered By: Baruch Gouty on 09/07/2019 16:18:02 -------------------------------------------------------------------------------- Encounter Discharge Information Details Patient Name: Date of Service: Tammy Hines 09/07/2019 3:15 PM Medical Record Number: [id number] Patient Account Number: [id number] Date of Birth/Sex: Treating RN: 1960-06-20 (59 y.o. Tammy Hines Primary Care Yuval Rubens: HA Reather Littler, SA [place] Other Clinician: Referring Myan Suit: Treating Alano Blasco/Extender: Worthy Keeler HA SSA Delane Ginger, SA [place] Weeks in Treatment: 6 Encounter Discharge Information Items Discharge Condition: Stable Ambulatory Status: Wheelchair Discharge Destination: Home Transportation: Private Auto Accompanied By: self Schedule Follow-up Appointment: Yes Clinical Summary of Care: Patient Declined Electronic Signature(s) Signed: 09/07/2019 4:36:43 PM By: Carlene Coria RN Entered By: Carlene Coria on 09/07/2019 16:35:48 -------------------------------------------------------------------------------- Lower Extremity Assessment Details Patient Name: Date of Service: CHRISELDA, LEPPERT 09/07/2019 3:15 PM Medical Record Number: [id number] Patient Account Number: [id number] Date of Birth/Sex: Treating RN: 1960/12/04 (59 y.o. Debby Bud Primary Care Maryama Kuriakose: HA Reather Littler, SA [place] Other Clinician: Referring Janayia Burggraf: Treating Stoy Fenn/Extender: Worthy Keeler HA SSA N, SA [place] Weeks in Treatment: 6 Electronic Signature(s) Signed: 09/07/2019 5:37:32 PM By: Deon Pilling Entered By: Deon Pilling on 09/07/2019 16:01:57 -------------------------------------------------------------------------------- Strawberry Details Patient Name: Date of Service: SINIA, ANTOSH 09/07/2019 3:15 PM Medical Record Number: [id number] Patient Account Number: [id number] Date of Birth/Sex: Treating RN: 24-May-1960 (59 y.o. Elam Dutch Primary Care Niharika Savino: HA Reather Littler, SA [place] Other Clinician: Referring Keshauna Degraffenreid: Treating Jason Frisbee/Extender: Worthy Keeler HA SSA N, SA [place] Weeks in Treatment: 6 Active Inactive Abuse / Safety / Falls / Self Care Management Nursing Diagnoses: Potential for falls Potential for injury related to falls Goals: Patient will remain injury free related to falls Date Initiated: 07/25/2019 Date Inactivated: 09/07/2019 Target Resolution Date: 08/26/2019 Goal Status: Met Patient/caregiver will verbalize/demonstrate measures taken to prevent injury and/or falls Date  Initiated: 07/25/2019 Target Resolution Date: 10/05/2019 Goal Status: Active Interventions: Assess Activities of Daily Living upon admission and as needed Assess fall risk on admission and as needed Assess: immobility, friction, shearing, incontinence upon admission and as needed Assess impairment of mobility on admission and as needed per policy Assess personal safety and home safety (as indicated) on admission  and as needed Assess self care needs on admission and as needed Provide education on fall prevention Provide education on personal and home safety Notes: Pressure Nursing Diagnoses: Knowledge deficit related to causes and risk factors for pressure ulcer development Knowledge deficit related to management of pressures ulcers Potential for impaired tissue integrity related to pressure, friction, moisture, and shear Goals: Patient/caregiver will verbalize risk factors for pressure ulcer development Date Initiated: 07/25/2019 Date Inactivated: 09/07/2019 Target Resolution Date: 08/26/2019 Goal Status: Met Patient/caregiver will verbalize understanding of pressure ulcer management Date Initiated: 07/25/2019 Target Resolution Date: 10/05/2019 Goal Status: Active Interventions: Assess: immobility, friction, shearing, incontinence upon admission and as needed Assess offloading mechanisms upon admission and as needed Assess potential for pressure ulcer upon admission and as needed Provide education on pressure ulcers Notes: Wound/Skin Impairment Nursing Diagnoses: Impaired tissue integrity Knowledge deficit related to ulceration/compromised skin integrity Goals: Patient/caregiver will verbalize understanding of skin care regimen Date Initiated: 07/25/2019 Target Resolution Date: 10/05/2019 Goal Status: Active Interventions: Assess patient/caregiver ability to obtain necessary supplies Assess patient/caregiver ability to perform ulcer/skin care regimen upon admission and as  needed Assess ulceration(s) every visit Provide education on ulcer and skin care Notes: Electronic Signature(s) Signed: 09/07/2019 5:52:33 PM By: Baruch Gouty RN, BSN Entered By: Baruch Gouty on 09/07/2019 16:16:46 -------------------------------------------------------------------------------- Pain Assessment Details Patient Name: Date of Service: Tammy Hines 09/07/2019 3:15 PM Medical Record Number: [id number] Patient Account Number: [id number] Date of Birth/Sex: Treating RN: 03-Apr-1961 (59 y.o. Elam Dutch Primary Care Anjeli Casad: HA Reather Littler, SA [place] Other Clinician: Referring Mariangela Heldt: Treating Chamari Cutbirth/Extender: Worthy Keeler HA SSA N, SA [place] Weeks in Treatment: 6 Active Problems Location of Pain Severity and Description of Pain Patient Has Paino Yes Site Locations Rate the pain. Current Pain Level: 10 Pain Management and Medication Current Pain Management: Electronic Signature(s) Signed: 09/07/2019 5:52:33 PM By: Baruch Gouty RN, BSN Signed: 09/08/2019 9:12:52 AM By: Sandre Kitty Entered By: Sandre Kitty on 09/07/2019 15:54:36 -------------------------------------------------------------------------------- Patient/Caregiver Education Details Patient Name: Date of Service: Tammy Hines 6/2/2021andnbsp3:15 PM Medical Record Number: [id number] Patient Account Number: [id number] Date of Birth/Gender: Treating RN: 1960-10-30 (59 y.o. Elam Dutch Primary Care Physician: HA Reather Littler, SA [place] Other Clinician: Referring Physician: Treating Physician/Extender: Worthy Keeler HA SSA Delane Ginger, SA [place] Weeks in Treatment: 6 Education Assessment Education Provided To: Patient Education Topics Provided Pressure: Methods: Explain/Verbal Responses: Reinforcements needed, State content correctly Wound/Skin Impairment: Methods: Explain/Verbal Responses: Reinforcements needed, State content correctly Electronic Signature(s) Signed: 09/07/2019 5:52:33 PM  By: Baruch Gouty RN, BSN Entered By: Baruch Gouty on 09/07/2019 16:17:10 -------------------------------------------------------------------------------- Wound Assessment Details Patient Name: Date of Service: ZARIELLE, CEA 09/07/2019 3:15 PM Medical Record Number: [id number] Patient Account Number: [id number] Date of Birth/Sex: Treating RN: Aug 27, 1960 (59 y.o. Helene Shoe, Meta.Reding Primary Care Rossana Molchan: HA Reather Littler, SA [place] Other Clinician: Referring Akari Defelice: Treating Algie Westry/Extender: Worthy Keeler HA SSA N, SA [place] Weeks in Treatment: 6 Wound Status Wound Number: 1 Primary Etiology: Pressure Ulcer Wound Location: Right, Proximal Gluteus Wound Status: Healed - Epithelialized Wounding Event: Gradually Appeared Date Acquired: 05/09/2019 Weeks Of Treatment: 6 Clustered Wound: No Wound Measurements Length: (cm) Width: (cm) Depth: (cm) Area: (cm) Volume: (cm) 0 % Reduction in Area: 100% 0 % Reduction in Volume: 100% 0 0 0 Wound Description Classification: Category/Stage II Electronic Signature(s) Signed: 09/07/2019 5:37:32 PM By: Deon Pilling Entered By: Deon Pilling on 09/07/2019 16:03:45 -------------------------------------------------------------------------------- Wound Assessment Details Patient Name: Date of Service: LUANNE, KRZYZANOWSKI 09/07/2019 3:15 PM Medical Record  Number: [id number] Patient Account Number: [id number] Date of Birth/Sex: Treating RN: 07/07/1960 (59 y.o. Helene Shoe, Meta.Reding Primary Care Jayzen Paver: HA Reather Littler, SA [place] Other Clinician: Referring Pascual Mantel: Treating Lynia Landry/Extender: Worthy Keeler HA SSA N, SA [place] Weeks in Treatment: 6 Wound Status Wound Number: 2 Primary Etiology: Pressure Ulcer Wound Location: Right Gluteus Wound Status: Healed - Epithelialized Wounding Event: Gradually Appeared Date Acquired: 05/09/2019 Weeks Of Treatment: 6 Clustered Wound: No Wound Measurements Length: (cm) Width: (cm) Depth: (cm) Area:  (cm) Volume: (cm) 0 % Reduction in Area: 100% 0 % Reduction in Volume: 100% 0 0 0 Wound Description Classification: Category/Stage II Electronic Signature(s) Signed: 09/07/2019 5:37:32 PM By: Deon Pilling Entered By: Deon Pilling on 09/07/2019 16:03:46 -------------------------------------------------------------------------------- Wound Assessment Details Patient Name: Date of Service: KYRAH, SCHIRO 09/07/2019 3:15 PM Medical Record Number: [id number] Patient Account Number: [id number] Date of Birth/Sex: Treating RN: 05-17-1960 (59 y.o. Helene Shoe, Meta.Reding Primary Care Taja Pentland: HA Reather Littler, SA [place] Other Clinician: Referring Tajuan Dufault: Treating Amado Andal/Extender: Worthy Keeler HA SSA N, SA [place] Weeks in Treatment: 6 Wound Status Wound Number: 3 Primary Etiology: Pressure Ulcer Wound Location: Right, Distal Gluteus Wound Status: Healed - Epithelialized Wounding Event: Gradually Appeared Date Acquired: 05/09/2019 Weeks Of Treatment: 6 Clustered Wound: No Wound Measurements Length: (cm) Width: (cm) Depth: (cm) Area: (cm) Volume: (cm) 0 % Reduction in Area: 100% 0 % Reduction in Volume: 100% 0 0 0 Wound Description Classification: Category/Stage II Electronic Signature(s) Signed: 09/07/2019 5:37:32 PM By: Deon Pilling Entered By: Deon Pilling on 09/07/2019 16:03:47 -------------------------------------------------------------------------------- Wound Assessment Details Patient Name: Date of Service: CHERA, SLIVKA 09/07/2019 3:15 PM Medical Record Number: [id number] Patient Account Number: [id number] Date of Birth/Sex: Treating RN: 07/05/1960 (59 y.o. Helene Shoe, Meta.Reding Primary Care Makiyah Zentz: HA Reather Littler, SA [place] Other Clinician: Referring Kadia Abaya: Treating Swati Granberry/Extender: Worthy Keeler HA SSA N, SA [place] Weeks in Treatment: 6 Wound Status Wound Number: 4 Primary Etiology: Pressure Ulcer Wound Location: Left Gluteus Wound Status: Open Wounding Event:  Gradually Appeared Comorbid Chronic Obstructive Pulmonary Disease (COPD), History: Hepatitis B Date Acquired: 05/09/2019 Weeks Of Treatment: 6 Clustered Wound: No Wound Measurements Length: (cm) 0.5 Width: (cm) 0.5 Depth: (cm) 0.1 Area: (cm) 0.196 Volume: (cm) 0.02 % Reduction in Area: 79.2% % Reduction in Volume: 78.7% Epithelialization: Large (67-100%) Tunneling: No Undermining: No Wound Description Classification: Category/Stage II Wound Margin: Flat and Intact Exudate Amount: Medium Exudate Type: Serosanguineous Exudate Color: red, brown Foul Odor After Cleansing: No Slough/Fibrino No Wound Bed Granulation Amount: Large (67-100%) Exposed Structure Granulation Quality: Red Fascia Exposed: No Necrotic Amount: None Present (0%) Fat Layer (Subcutaneous Tissue) Exposed: Yes Tendon Exposed: No Muscle Exposed: No Joint Exposed: No Bone Exposed: No Treatment Notes Wound #4 (Left Gluteus) 1. Cleanse With Wound Cleanser 2. Periwound Care Skin Prep 3. Primary Dressing Applied Xeroform Gauze 4. Secondary Dressing Other secondary dressing (specify in notes) Notes duoderm as secondary. explained the orders and dressings. patient in agreement. Electronic Signature(s) Signed: 09/07/2019 5:37:32 PM By: Deon Pilling Entered By: Deon Pilling on 09/07/2019 16:03:01 -------------------------------------------------------------------------------- Wound Assessment Details Patient Name: Date of Service: JENNIFE, ZAUCHA 09/07/2019 3:15 PM Medical Record Number: [id number] Patient Account Number: [id number] Date of Birth/Sex: Treating RN: [birth year] (59 y.o. Debby Bud Primary Care Frannie Shedrick: HA Reather Littler, SA [place] Other Clinician: Referring Shontae Rosiles: Treating Haruna Rohlfs/Extender: Worthy Keeler HA SSA N, SA [place] Weeks in Treatment: 6 Wound Status Wound Number: 5 Primary Etiology: Pressure Ulcer Wound Location: Left, Proximal, Posterior Upper Leg Wound Status:  Healed -  Epithelialized Wounding Event: Gradually Appeared Date Acquired: 05/09/2019 Weeks Of Treatment: 6 Clustered Wound: No Wound Measurements Length: (cm) Width: (cm) Depth: (cm) Area: (cm) Volume: (cm) 0 % Reduction in Area: 100% 0 % Reduction in Volume: 100% 0 0 0 Wound Description Classification: Category/Stage II Electronic Signature(s) Signed: 09/07/2019 5:37:32 PM By: Deon Pilling Entered By: Deon Pilling on 09/07/2019 16:03:47 -------------------------------------------------------------------------------- Wound Assessment Details Patient Name: Date of Service: LORREE, MILLAR 09/07/2019 3:15 PM Medical Record Number: [id number] Patient Account Number: [id number] Date of Birth/Sex: Treating RN: 05-30-1960 (59 y.o. Helene Shoe, Meta.Reding Primary Care Hanif Radin: HA Reather Littler, SA [place] Other Clinician: Referring Wilbert Schouten: Treating Shandell Jallow/Extender: Worthy Keeler HA SSA N, SA [place] Weeks in Treatment: 6 Wound Status Wound Number: 6 Primary Etiology: Pressure Ulcer Wound Location: Left, Posterior Upper Leg Wound Status: Healed - Epithelialized Wounding Event: Gradually Appeared Date Acquired: 05/09/2019 Weeks Of Treatment: 6 Clustered Wound: No Wound Measurements Length: (cm) Width: (cm) Depth: (cm) Area: (cm) Volume: (cm) 0 % Reduction in Area: 100% 0 % Reduction in Volume: 100% 0 0 0 Wound Description Classification: Category/Stage II Electronic Signature(s) Signed: 09/07/2019 5:37:32 PM By: Deon Pilling Entered By: Deon Pilling on 09/07/2019 16:03:48 -------------------------------------------------------------------------------- Vitals Details Patient Name: Date of Service: Tammy Hines 09/07/2019 3:15 PM Medical Record Number: [id number] Patient Account Number: [id number] Date of Birth/Sex: Treating RN: [birth year] (59 y.o. Elam Dutch Primary Care Raetta Agostinelli: HA Reather Littler, SA [place] Other Clinician: Referring Kijana Cromie: Treating Taylyn Brame/Extender: Worthy Keeler HA SSA N, SA [place] Weeks in Treatment: 6 Vital Signs Time Taken: 15:52 Temperature (F): 98.5 Height (in): 67 Pulse (bpm): 70 Weight (lbs): 268 Respiratory Rate (breaths/min): 18 Body Mass Index (BMI): 42 Blood Pressure (mmHg): 139/85 Reference Range: 80 - 120 mg / dl Electronic Signature(s) Signed: 09/08/2019 9:12:52 AM By: Sandre Kitty Entered By: Sandre Kitty on 09/07/2019 15:54:30

## 2019-10-12 ENCOUNTER — Encounter (HOSPITAL_BASED_OUTPATIENT_CLINIC_OR_DEPARTMENT_OTHER): Payer: Medicare Other | Attending: Physician Assistant | Admitting: Physician Assistant

## 2019-10-12 DIAGNOSIS — L97101 Non-pressure chronic ulcer of unspecified thigh limited to breakdown of skin: Secondary | ICD-10-CM | POA: Insufficient documentation

## 2019-10-12 DIAGNOSIS — Z6841 Body Mass Index (BMI) 40.0 and over, adult: Secondary | ICD-10-CM | POA: Diagnosis not present

## 2019-10-12 DIAGNOSIS — L89322 Pressure ulcer of left buttock, stage 2: Secondary | ICD-10-CM | POA: Diagnosis not present

## 2019-10-12 DIAGNOSIS — E669 Obesity, unspecified: Secondary | ICD-10-CM | POA: Insufficient documentation

## 2019-10-12 DIAGNOSIS — L89312 Pressure ulcer of right buttock, stage 2: Secondary | ICD-10-CM | POA: Insufficient documentation

## 2019-10-12 NOTE — Progress Notes (Addendum)
Tammy JarvisSTRIPE, Riti K. ([id number]) Visit Report for 10/12/2019 Chief Complaint Document Details Patient Name: Date of Service: Tammy JarvisSTRIPE, Tammy K. 10/12/2019 2:30 PM Medical Record Number: [id number] Patient Account Number: [id number] Date of Birth/Sex: Treating RN: Mar 31, 1961 (59 y.o. Tommye StandardF) Boehlein, Linda Primary Care Provider: HA Ian BushmanSSA N, SA [place] Other Clinician: Referring Provider: Treating Provider/Extender: Lenda KelpStone III, Breeana Sawtelle HA SSA N, SA [place] Weeks in Treatment: 11 Information Obtained from: Patient Chief Complaint 07/25/2019; patient is here for review of wounds on the bilateral buttocks which are pressure area Electronic Signature(s) Signed: 10/12/2019 3:00:29 PM By: Lenda KelpStone III, Nanie Dunkleberger PA-C Entered By: Lenda KelpStone III, Mellony Danziger on 10/12/2019 15:00:28 -------------------------------------------------------------------------------- HPI Details Patient Name: Date of Service: Tammy JarvisSTRIPE, Tammy K. 10/12/2019 2:30 PM Medical Record Number: [id number] Patient Account Number: [id number] Date of Birth/Sex: Treating RN: Mar 31, 1961 (59 y.o. Tommye StandardF) Boehlein, Linda Primary Care Provider: HA Ian BushmanSSA N, SA [place] Other Clinician: Referring Provider: Treating Provider/Extender: Lenda KelpStone III, Zachry Hopfensperger HA SSA N, SA [place] Weeks in Treatment: 11 History of Present Illness HPI Description: ADMISSION 07/25/2019 This is a 59 year old woman with marked disability. She is only able to transfer from a chair to her wheelchair. The exact reason for this is not totally clear. She is listed in her problem list is having a cryptogenic stroke as well as severe left hip pain. The patient states that she sleeps in her recliner. She is incontinent of urine but is seeing [facility]. Was a recently discovered via CT scan of the abdomen and pelvis to have a 6 x 6 cm bladder mass. Over the last 3 months she has developed pressure ulcers x6 on her bilateral buttocks and left upper posterior thigh. The patient was seen I believe on at least 2 occasions at the wound  care center in [facility]. They ordered her Hibiclens and Xeroform. Apparently to go there had an exorbitant co-pay through her insurance so she arrives here. Past medical history includes a cryptogenic stroke, COPD, depression, narcotic induced delirium, hypertension, chronic back pain, diastolic congestive heart failure, continued smoker, she is nonambulatory. She has marked urinary incontinence. 09/07/2019 upon evaluation today patient appears to be doing very well in regard to her wounds. She has not been seen since her initial admission on April 19. With that being said fortunately her wounds have all healed except for just 1 which also seems to be doing well. I see no evidence of active infection overall very pleased and the patient likewise is pleased as well. She has been using the DuoDERM with Xeroform underneath. 10/12/2019 upon evaluation today patient appears to be doing unfortunately not quite as well today with regard to her gluteal region. She tells me that she ran out of some of her dressing supplies and unfortunately I think this has played some role. With that being said I think that we may need to switch to something other than Xeroform and DuoDERM. I really was not super excited about the combination anyway but to be honest I kept it simply due to the fact that the patient was already doing better with that. I therefore was trying to not rock the boat too much as far as switching things up as long as it was improving. With that being said at this point I feel like the patient is not doing nearly as well as what she was previous and I think that were definitely fine to make a switch here. Electronic Signature(s) Signed: 10/12/2019 3:41:03 PM By: Lenda KelpStone III, Jourdin Connors PA-C Entered By: Linwood DibblesStone III,  Pryor Guettler on 10/12/2019 15:41:03 -------------------------------------------------------------------------------- Physical Exam Details Patient Name: Date of Service: Tammy Hines, Tammy Hines  10/12/2019 2:30 PM Medical Record Number: [id number] Patient Account Number: [id number] Date of Birth/Sex: Treating RN: 1960-07-24 (59 y.o. Tommye Standard Primary Care Provider: HA Ian Bushman, SA [place] Other Clinician: Referring Provider: Treating Provider/Extender: Lenda Kelp HA SSA N, SA [place] Weeks in Treatment: 11 Constitutional Obese and well-hydrated in no acute distress. Respiratory normal breathing without difficulty. Psychiatric this patient is able to make decisions and demonstrates good insight into disease process. Alert and Oriented x 3. pleasant and cooperative. Notes Upon inspection patient's wound bed actually showed signs of some slightly hyper granular areas that seem to be draining more fortunately there is no sign of systemic infection which is great news. No fevers, chills, nausea, vomiting, or diarrhea. I do believe the patient may do better however with a silver alginate dressing and bordered foam over these areas. Electronic Signature(s) Signed: 10/12/2019 3:41:33 PM By: Lenda Kelp PA-C Entered By: Lenda Kelp on 10/12/2019 15:41:32 -------------------------------------------------------------------------------- Physician Orders Details Patient Name: Date of Service: Tammy Hines, Tammy Hines 10/12/2019 2:30 PM Medical Record Number: [id number] Patient Account Number: [id number] Date of Birth/Sex: Treating RN: January 16, 1961 (59 y.o. 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 Dressing Change Frequency Change dressing every  day. - all wounds Skin Barriers/Peri-Wound Care Skin Prep Wound Cleansing May shower and wash wound with soap and water. Primary Wound Dressing Wound #4 Left Gluteus Calcium Alginate with Silver Wound #7 Right,Posterior Upper Leg Calcium Alginate with Silver Wound #8 Right Gluteus Calcium Alginate with Silver Wound #9 Right,Distal Gluteus Calcium Alginate with Silver Secondary Dressing Wound #4 Left Gluteus Foam Border Wound #7 Right,Posterior Upper Leg Foam Border Wound #8 Right Gluteus Foam Border Wound #9 Right,Distal Gluteus Foam Border Off-Loading Turn and reposition every 2 hours Electronic Signature(s) Signed: 10/12/2019 5:11:14 PM By: Lenda Kelp PA-C Signed: 10/12/2019 5:28:41 PM By: Zenaida Deed RN, BSN Entered By: Zenaida Deed on 10/12/2019 15:40:18 -------------------------------------------------------------------------------- Problem List Details Patient Name: Date of Service: Tammy Hines 10/12/2019 2:30 PM Medical Record Number: [id number] Patient Account Number: [id number] Date of Birth/Sex: Treating RN: [birth year] (59 y.o. 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 Tommye Standard Primary Care Provider: HA Ian Bushman, SA [place] Other Clinician: Referring Provider: Treating Provider/Extender: Linwood Dibbles,  Farryn Linares HA SSA N, SA [place] Weeks in Treatment: 11 Subjective Chief Complaint Information obtained from Patient 07/25/2019; patient is here for review of wounds on the bilateral buttocks which are pressure area History of Present Illness (HPI) ADMISSION 07/25/2019 This is a 59 year old woman with marked disability. She is only able to transfer from a chair to her wheelchair. The exact reason for this is not totally clear. She is listed in her problem list is having a cryptogenic stroke as well as severe left hip pain. The patient states that she sleeps in her recliner. She is incontinent of urine but is seeing [facility]. Was a recently discovered via CT scan of the abdomen and pelvis to have a 6 x 6 cm bladder mass. Over the last 3 months she has developed pressure ulcers x6 on her bilateral buttocks and left upper posterior thigh. The patient was seen I believe on at least 2 occasions at the wound care center in [facility] of [place]. They ordered her Hibiclens and Xeroform. Apparently to go there had an exorbitant co-pay through her insurance so she arrives here. Past medical history includes a cryptogenic stroke, COPD, depression, narcotic induced delirium, hypertension, chronic back pain, diastolic congestive heart failure, continued smoker, she is nonambulatory. She has marked urinary incontinence. 09/07/2019 upon evaluation today patient appears to be doing very well in regard to her wounds. She has not been seen since her initial admission on April 19. With that being said fortunately her wounds have all healed except for just 1 which also seems to be doing well. I see no evidence  of active infection overall very pleased and the patient likewise is pleased as well. She has been using the DuoDERM with Xeroform underneath. 10/12/2019 upon evaluation today patient appears to be doing unfortunately not quite as well today with regard to her gluteal region. She tells me that she ran out of some of her dressing supplies and unfortunately I think this has played some role. With that being said I think that we may need to switch to something other than Xeroform and DuoDERM. I really was not super excited about the combination anyway but to be honest I kept it simply due to the fact that the patient was already doing better with that. I therefore was trying to not rock the boat too much as far as switching things up as long as it was improving. With that being said at this point I feel like the patient is not doing nearly as well as what she was previous and I think that were definitely fine to make a switch here. Objective Constitutional Obese and well-hydrated in no acute distress. Vitals Time Taken: 3:12 PM, Height: 67 in, Weight: 268 lbs, BMI: 42, Temperature: 97.9 F, Pulse: 106 bpm, Respiratory Rate: 18 breaths/min, Blood Pressure: 153/88 mmHg. Respiratory normal breathing without difficulty. Psychiatric this patient is able to make decisions and demonstrates good insight into disease process. Alert and Oriented x 3. pleasant and cooperative. General Notes: Upon inspection patient's wound bed actually showed signs of some slightly hyper granular areas that seem to be draining more fortunately there is no sign of systemic infection which is great news. No fevers, chills, nausea, vomiting, or diarrhea. I do believe the patient may do better however with a silver alginate dressing and bordered foam over these areas. Integumentary (Hair, Skin) Wound #4 status is Open. Original cause of wound was Gradually Appeared. The wound is located on the Left Gluteus. The wound measures  0.6cm length x 0.7cm width x 0.1cm depth; 0.33cm^2 area and 0.033cm^3 volume. There is Fat Layer (Subcutaneous Tissue) Exposed exposed. There is no tunneling or undermining noted. There is a medium amount of serosanguineous drainage noted. The wound margin is flat and intact. There is large (67-100%) red granulation within the wound bed. There is no necrotic tissue within the wound bed. Wound #7 status is Open. Original cause of wound was Pressure Injury. The wound is located on the Right,Posterior Upper Leg. The wound measures 1cm length x 1cm width x 0.1cm depth; 0.785cm^2 area and 0.079cm^3 volume. There is Fat Layer (Subcutaneous Tissue) Exposed exposed. There is no tunneling or undermining noted. There is a medium amount of serosanguineous drainage noted. The wound margin is flat and intact. There is large (67-100%) pink granulation within the wound bed. There is no necrotic tissue within the wound bed. Wound #8 status is Open. Original cause of wound was Pressure Injury. The wound is located on the Right Gluteus. The wound measures 0.5cm length x 0.8cm width x 0.1cm depth; 0.314cm^2 area and 0.031cm^3 volume. There is Fat Layer (Subcutaneous Tissue) Exposed exposed. There is no tunneling or undermining noted. There is a medium amount of serosanguineous drainage noted. The wound margin is flat and intact. There is large (67-100%) pink granulation within the wound bed. There is no necrotic tissue within the wound bed. Wound #9 status is Open. Original cause of wound was Pressure Injury. The wound is located on the Right,Distal Gluteus. The wound measures 1.5cm length x 0.6cm width x 0.1cm depth; 0.707cm^2 area and 0.071cm^3 volume. There is Fat Layer (Subcutaneous Tissue) Exposed exposed. There is no tunneling or undermining noted. There is a medium amount of serosanguineous drainage noted. The wound margin is flat and intact. There is large (67-100%) pink granulation within the wound bed. There  is no necrotic tissue within the wound bed. 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 Dressing Change Frequency: Change dressing every day. - all wounds Skin Barriers/Peri-Wound Care: Skin Prep Wound Cleansing: May shower and wash wound with soap and water. Primary Wound Dressing: Wound #4 Left Gluteus: Calcium Alginate with Silver Wound #7 Right,Posterior Upper Leg: Calcium Alginate with Silver Wound #8 Right Gluteus: Calcium Alginate with Silver Wound #9 Right,Distal Gluteus: Calcium Alginate with Silver Secondary Dressing: Wound #4 Left Gluteus: Foam Border Wound #7 Right,Posterior Upper Leg: Foam Border Wound #8 Right Gluteus: Foam Border Wound #9 Right,Distal Gluteus: Foam Border Off-Loading: Turn and reposition every 2 hours 1., Recommend currently that we go ahead and initiate treatment with silver alginate and bordered foam to all open wounds on the gluteal region. 2. I am also can recommend currently that the patient needs to continue to offload keeping pressure off of the gluteal area I think this is still of utmost importance as far as letting these areas heal and keep them from worsening again. We will see patient back for reevaluation in 2 weeks here in the clinic. If anything worsens or changes patient will contact our office for additional recommendations. With regard to whether or not the patient can proceed with a colonoscopy I think if this is something they need to do to further evaluate her status that she should go ahead forward with this in my opinion. I do not think that the wound she currently has should  prohibit her from being able to proceed with a colonoscopy. Electronic Signature(s) Signed: 10/12/2019 3:42:56 PM By: Lenda Kelp  PA-C Previous Signature: 10/12/2019 3:42:11 PM Version By: Lenda Kelp PA-C Entered By: Lenda Kelp on 10/12/2019 15:42:56 -------------------------------------------------------------------------------- SuperBill Details Patient Name: Date of Service: Tammy Hines 10/12/2019 Medical Record Number: [id number] Patient Account Number: [id number] Date of Birth/Sex: Treating RN: March 29, 1961 (59 y.o. 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

## 2019-10-17 ENCOUNTER — Institutional Professional Consult (permissible substitution): Payer: Medicare Other | Admitting: Internal Medicine

## 2019-10-19 NOTE — Progress Notes (Addendum)
Tammy, Hines ([id number]) Visit Report for 10/12/2019 Arrival Information Details Patient Name: Date of Service: Tammy Hines, Tammy Hines 10/12/2019 2:30 PM Medical Record Number: [id number] Patient Account Number: [id number] Date of Birth/Sex: Treating RN: 1960-11-26 (59 y.o. Tammy Hines Kahron Kauth: HA Ian Bushman, SA [place] Other Clinician: Referring Theresa Wedel: Treating Alverda Nazzaro/Extender: Lenda Kelp HA SSA N, SA [place] Weeks in Treatment: 11 Visit Information History Since Last Visit Added or deleted any medications: No Patient Arrived: Wheel Chair Any new allergies or adverse reactions: No Arrival Time: 15:11 Had a fall or experienced change in No Accompanied By: husband activities of daily living that may affect Transfer Assistance: None risk of falls: Patient Identification Verified: Yes Signs or symptoms of abuse/neglect since last visito No Secondary Verification Process Completed: Yes Hospitalized since last visit: No Patient Requires Transmission-Based Precautions: No Implantable device outside of the clinic excluding No Patient Has Alerts: No cellular tissue based products placed in the center since last visit: Has Dressing in Place as Prescribed: Yes Pain Present Now: No Electronic Signature(s) Signed: 10/14/2019 2:13:12 PM By: Karl Ito Entered By: Karl Ito on 10/12/2019 15:12:31 -------------------------------------------------------------------------------- Clinic Level of Hines Assessment Details Patient Name: Date of Service: Tammy Hines, Tammy Hines 10/12/2019 2:30 PM Medical Record Number: [id number] Patient Account Number: [id number] Date of Birth/Sex: Treating RN: 1960/11/02 (58 y.o. Tammy Hines: HA Ian Bushman, SA [place] Other Clinician: Referring Nishtha Raider: Treating Sylvain Hasten/Extender: Lenda Kelp HA SSA N, SA [place] Weeks in Treatment: 11 Clinic Level of Hines Assessment Items TOOL 4 Quantity Score []  - 0 Use when only  an EandM is performed on FOLLOW-UP visit ASSESSMENTS - Nursing Assessment / Reassessment X- 1 10 Reassessment of Co-morbidities (includes updates in patient status) X- 1 5 Reassessment of Adherence to Treatment Plan ASSESSMENTS - Wound and Skin A ssessment / Reassessment []  - 0 Simple Wound Assessment / Reassessment - one wound X- 4 5 Complex Wound Assessment / Reassessment - multiple wounds []  - 0 Dermatologic / Skin Assessment (not related to wound area) ASSESSMENTS - Focused Assessment []  - 0 Circumferential Edema Measurements - multi extremities []  - 0 Nutritional Assessment / Counseling / Intervention []  - 0 Lower Extremity Assessment (monofilament, tuning fork, pulses) []  - 0 Peripheral Arterial Disease Assessment (using hand held doppler) ASSESSMENTS - Ostomy and/or Continence Assessment and Hines []  - 0 Incontinence Assessment and Management []  - 0 Ostomy Hines Assessment and Management (repouching, etc.) PROCESS - Coordination of Hines X - Simple Patient / Family Education for ongoing Hines 1 15 []  - 0 Complex (extensive) Patient / Family Education for ongoing Hines X- 1 10 Staff obtains , Records, T Results / Process Orders est []  - 0 Staff telephones HHA, Nursing Homes / Clarify orders / etc []  - 0 Routine Transfer to another Facility (non-emergent condition) []  - 0 Routine Hospital Admission (non-emergent condition) []  - 0 New Admissions / / Ordering NPWT Apligraf, etc. , []  - 0 Emergency Hospital Admission (emergent condition) X- 1 10 Simple Discharge Coordination []  - 0 Complex (extensive) Discharge Coordination PROCESS - Special Needs []  - 0 Pediatric / Minor Patient Management []  - 0 Isolation Patient Management []  - 0 Hearing / Language / Visual special needs []  - 0 Assessment of Community assistance (transportation, D/C planning, etc.) []  - 0 Additional assistance / Altered mentation []  - 0 Support Surface(s)  Assessment (bed, cushion, seat, etc.) INTERVENTIONS - Wound Cleansing / Measurement []  - 0 Simple Wound Cleansing - one wound  X- 4 5 Complex Wound Cleansing - multiple wounds X- 1 5 Wound Imaging (photographs - any number of wounds) []  - 0 Wound Tracing (instead of photographs) []  - 0 Simple Wound Measurement - one wound X- 4 5 Complex Wound Measurement - multiple wounds INTERVENTIONS - Wound Dressings X - Small Wound Dressing one or multiple wounds 4 10 []  - 0 Medium Wound Dressing one or multiple wounds []  - 0 Large Wound Dressing one or multiple wounds X- 1 5 Application of Medications - topical []  - 0 Application of Medications - injection INTERVENTIONS - Miscellaneous []  - 0 External ear exam []  - 0 Specimen Collection (cultures, biopsies, blood, body fluids, etc.) []  - 0 Specimen(s) / Culture(s) sent or taken to Lab for analysis []  - 0 Patient Transfer (multiple staff / / Similar devices) []  - 0 Simple Staple / Suture removal (25 or less) []  - 0 Complex Staple / Suture removal (26 or more) []  - 0 Hypo / Hyperglycemic Management (close monitor of Blood Glucose) []  - 0 Ankle / Brachial Index (ABI) - do not check if billed separately X- 1 5 Vital Signs Has the patient been seen at the hospital within the last three years: Yes Total Score: 165 Level Of Hines: New/Established - Level 5 Electronic Signature(s) Signed: 10/12/2019 5:28:41 PM By: RN, BSN Entered By: on 10/12/2019 15:38:29 -------------------------------------------------------------------------------- Encounter Discharge Information Details Patient Name: Date of Service: 10/12/2019 2:30 PM Medical Record Number: Patient Account Number: Date of Birth/Sex: Treating RN: July 03, 1960 (59 y.o. Primary Hines Tammy Hines: HA , SA [place] Other Clinician: Referring Jolee Critcher: Treating Colbi Schiltz/Extender: HA SSA N, SA [place] Weeks in Treatment: 11 Encounter Discharge Information Items Discharge Condition: Stable Ambulatory Status: Wheelchair Discharge Destination: Home Transportation: Private Auto Accompanied By: family friend Schedule Follow-up Appointment: Yes Clinical Summary of Hines: Patient Declined Electronic Signature(s) Signed: 10/12/2019 5:12:52 PM By: Zenaida Deed Entered By: 12/13/2019 on 10/12/2019 16:06:53 -------------------------------------------------------------------------------- Multi-Disciplinary Hines Plan Details Patient Name: Date of Service: 12/13/2019 10/12/2019 2:30 PM Medical Record Number: [id number] Patient Account Number: 03/16/1961 Date of Birth/Sex: Treating RN: [birth year] (59 y.o. Ian Bushman Primary Hines Donell Tomkins: HA Lenda Kelp, 12 [place] Other Clinician: Referring Martiza Speth: Treating Laird Runnion/Extender: 12/13/2019 HA SSA Cherylin Mylar, SA [place] Weeks in Treatment: 11 Active Inactive Electronic Signature(s) Signed: 12/27/2019 7:59:16 AM By: 12/13/2019 RN, BSN Signed: 01/05/2020 4:43:57 PM By: 12/13/2019 RN, BSN Previous Signature: 10/12/2019 5:28:41 PM Version By: [id number] RN, BSN Entered By: 03/16/1961 on 12/23/2019 09:29:09 -------------------------------------------------------------------------------- Pain Assessment Details Patient Name: Date of Service: Tammy Hines, Tammy Hines 10/12/2019 2:30 PM Medical Record Number: [place] Patient Account Number: Lenda Kelp Date of Birth/Sex: Treating RN: [birth year] (59 y.o. Zandra Abts Primary Hines Blayde Bacigalupi: HA 01/07/2020, SA [place] Other Clinician: Referring Cam Harnden: Treating Devanee Pomplun/Extender: Zenaida Deed HA SSA N, SA [place] Weeks in Treatment: 11 Active Problems Location of Pain Severity and Description of Pain Patient Has Paino No Site Locations Pain Management and Medication Current Pain Management: Electronic Signature(s) Signed: 10/12/2019 5:28:41 PM By: Zandra Abts RN, BSN Signed: 10/14/2019 2:13:12 PM By: Tammy Jarvis Entered By: 12/13/2019 on 10/12/2019 15:13:00 -------------------------------------------------------------------------------- Patient/Caregiver Education Details Patient Name: Date of Service: [id number] 7/7/2021andnbsp2:30 PM Medical Record Number: [id number] Patient Account Number: Tammy Standard Date of Birth/Gender: Treating RN: 11/03/1960 (59 y.o. 12 Primary Hines Physician: HA 12/13/2019, SA [place] Other Clinician:  Referring Physician: Treating Physician/Extender: Lenda Kelp HA SSA Dorris Carnes, SA [place] Weeks in Treatment: 11 Education Assessment Education Provided To: Patient Education Topics Provided Pressure: Methods: Explain/Verbal Responses: Reinforcements needed, State content correctly Wound/Skin Impairment: Methods: Explain/Verbal Responses: Reinforcements needed, State content correctly Electronic Signature(s) Signed: 10/12/2019 5:28:41 PM By: Zenaida Deed RN, BSN Entered By: Zenaida Deed on 10/12/2019 15:37:30 -------------------------------------------------------------------------------- Wound Assessment Details Patient Name: Date of Service: Tammy Jarvis 10/12/2019 2:30 PM Medical Record Number: [id number] Patient Account Number: [id number] Date of Birth/Sex: Treating RN: 1960-10-23 (59 y.o. Tammy Hines Armetta Henri: HA Ian Bushman, SA [place] Other Clinician: Referring Samiah Ricklefs: Treating Imogean Ciampa/Extender: Lenda Kelp HA SSA N, SA [place] Weeks in Treatment: 11 Wound Status Wound Number: 4 Primary Etiology: Pressure Ulcer Wound Location: Left Gluteus Wound Status: Open Wounding Event: Gradually Appeared Comorbid Chronic Obstructive Pulmonary Disease (COPD), History: Hepatitis B Date Acquired: 05/09/2019 Weeks Of Treatment: 11 Clustered Wound: No Photos Photo Uploaded By: Benjaman Kindler on 10/13/2019 13:42:28 Wound Measurements Length: (cm) 0.6 Width: (cm)  0.7 Depth: (cm) 0.1 Area: (cm) 0.33 Volume: (cm) 0.033 % Reduction in Area: 65% % Reduction in Volume: 64.9% Epithelialization: Medium (34-66%) Tunneling: No Undermining: No Wound Description Classification: Category/Stage II Wound Margin: Flat and Intact Exudate Amount: Medium Exudate Type: Serosanguineous Exudate Color: red, brown Foul Odor After Cleansing: No Slough/Fibrino No Wound Bed Granulation Amount: Large (67-100%) Exposed Structure Granulation Quality: Red Fascia Exposed: No Necrotic Amount: None Present (0%) Fat Layer (Subcutaneous Tissue) Exposed: Yes Tendon Exposed: No Muscle Exposed: No Joint Exposed: No Bone Exposed: No Electronic Signature(s) Signed: 10/12/2019 5:25:38 PM By: Zandra Abts RN, BSN Signed: 10/12/2019 5:28:41 PM By: Zenaida Deed RN, BSN Entered By: Zandra Abts on 10/12/2019 15:22:15 -------------------------------------------------------------------------------- Wound Assessment Details Patient Name: Date of Service: Tammy Hines, Tammy Hines 10/12/2019 2:30 PM Medical Record Number: [id number] Patient Account Number: [id number] Date of Birth/Sex: Treating RN: [birth year] (59 y.o. Tammy Hines Zuzanna Maroney: HA Ian Bushman, SA [place] Other Clinician: Referring Jodye Scali: Treating Shawnia Vizcarrondo/Extender: Lenda Kelp HA SSA N, SA [place] Weeks in Treatment: 11 Wound Status Wound Number: 7 Primary Etiology: Pressure Ulcer Wound Location: Right, Posterior Upper Leg Wound Status: Open Wounding Event: Pressure Injury Comorbid Chronic Obstructive Pulmonary Disease (COPD), History: Hepatitis B Date Acquired: 10/12/2019 Weeks Of Treatment: 0 Clustered Wound: No Photos Photo Uploaded By: Benjaman Kindler on 10/13/2019 13:42:29 Wound Measurements Length: (cm) 1 Width: (cm) 1 Depth: (cm) 0.1 Area: (cm) 0.785 Volume: (cm) 0.079 % Reduction in Area: 0% % Reduction in Volume: 0% Epithelialization: None Tunneling: No Undermining: No Wound  Description Classification: Category/Stage II Wound Margin: Flat and Intact Exudate Amount: Medium Exudate Type: Serosanguineous Exudate Color: red, brown Foul Odor After Cleansing: No Slough/Fibrino No Wound Bed Granulation Amount: Large (67-100%) Exposed Structure Granulation Quality: Pink Fascia Exposed: No Necrotic Amount: None Present (0%) Fat Layer (Subcutaneous Tissue) Exposed: Yes Tendon Exposed: No Muscle Exposed: No Joint Exposed: No Bone Exposed: No Electronic Signature(s) Signed: 10/12/2019 5:25:38 PM By: Zandra Abts RN, BSN Signed: 10/12/2019 5:28:41 PM By: Zenaida Deed RN, BSN Entered By: Zandra Abts on 10/12/2019 15:22:46 -------------------------------------------------------------------------------- Wound Assessment Details Patient Name: Date of Service: Tammy Hines, Tammy Hines 10/12/2019 2:30 PM Medical Record Number: [id number] Patient Account Number: [id number] Date of Birth/Sex: Treating RN: Jan 31, 1961 (59 y.o. Tammy Hines Deziray Nabi: HA Ian Bushman, SA [place] Other Clinician: Referring [place] Droll: Treating Trace Cederberg/Extender: Lenda Kelp HA SSA N, SA [place] Weeks in Treatment: 11 Wound Status Wound Number: 8 Primary Etiology: Pressure Ulcer  Wound Location: Right Gluteus Wound Status: Open Wounding Event: Pressure Injury Comorbid Chronic Obstructive Pulmonary Disease (COPD), History: Hepatitis B Date Acquired: 10/12/2019 Weeks Of Treatment: 0 Clustered Wound: No Photos Photo Uploaded By: Benjaman KindlerJones, Dedrick on 10/13/2019 13:43:01 Wound Measurements Length: (cm) 0.5 Width: (cm) 0.8 Depth: (cm) 0.1 Area: (cm) 0.314 Volume: (cm) 0.031 % Reduction in Area: 0% % Reduction in Volume: 0% Epithelialization: None Tunneling: No Undermining: No Wound Description Classification: Category/Stage II Wound Margin: Flat and Intact Exudate Amount: Medium Exudate Type: Serosanguineous Exudate Color: red, brown Foul Odor After Cleansing:  No Slough/Fibrino No Wound Bed Granulation Amount: Large (67-100%) Exposed Structure Granulation Quality: Pink Fascia Exposed: No Necrotic Amount: None Present (0%) Fat Layer (Subcutaneous Tissue) Exposed: Yes Tendon Exposed: No Muscle Exposed: No Joint Exposed: No Bone Exposed: No Electronic Signature(s) Signed: 10/12/2019 5:25:38 PM By: Zandra AbtsLynch, Shatara RN, BSN Signed: 10/12/2019 5:28:41 PM By: Zenaida DeedBoehlein, Linda RN, BSN Entered By: Zandra AbtsLynch, Shatara on 10/12/2019 15:23:11 -------------------------------------------------------------------------------- Wound Assessment Details Patient Name: Date of Service: Tammy Hines, Tammy K. 10/12/2019 2:30 PM Medical Record Number: [id number] Patient Account Number: [id number] Date of Birth/Sex: Treating RN: 1960-04-26 (59 y.o. Tammy StandardF) Boehlein, Linda Primary Hines Remi Rester: HA Ian BushmanSSA N, SA [place] Other Clinician: Referring Shanine Kreiger: Treating Jnai Snellgrove/Extender: Lenda KelpStone III, Hoyt HA SSA N, SA [place] Weeks in Treatment: 11 Wound Status Wound Number: 9 Primary Etiology: Pressure Ulcer Wound Location: Right, Distal Gluteus Wound Status: Open Wounding Event: Pressure Injury Comorbid Chronic Obstructive Pulmonary Disease (COPD), History: Hepatitis B Date Acquired: 10/12/2019 Weeks Of Treatment: 0 Clustered Wound: No Photos Photo Uploaded By: Benjaman KindlerJones, Dedrick on 10/13/2019 13:43:02 Wound Measurements Length: (cm) 1.5 Width: (cm) 0.6 Depth: (cm) 0.1 Area: (cm) 0.707 Volume: (cm) 0.071 % Reduction in Area: 0% % Reduction in Volume: 0% Epithelialization: None Tunneling: No Undermining: No Wound Description Classification: Category/Stage II Wound Margin: Flat and Intact Exudate Amount: Medium Exudate Type: Serosanguineous Exudate Color: red, brown Foul Odor After Cleansing: No Slough/Fibrino No Wound Bed Granulation Amount: Large (67-100%) Exposed Structure Granulation Quality: Pink Fascia Exposed: No Necrotic Amount: None Present (0%) Fat Layer  (Subcutaneous Tissue) Exposed: Yes Tendon Exposed: No Muscle Exposed: No Joint Exposed: No Bone Exposed: No Electronic Signature(s) Signed: 10/12/2019 5:25:38 PM By: Zandra AbtsLynch, Shatara RN, BSN Signed: 10/12/2019 5:28:41 PM By: Zenaida DeedBoehlein, Linda RN, BSN Entered By: Zandra AbtsLynch, Shatara on 10/12/2019 15:23:32 -------------------------------------------------------------------------------- Vitals Details Patient Name: Date of Service: Tammy Hines, Eliana K. 10/12/2019 2:30 PM Medical Record Number: [id number] Patient Account Number: [id number] Date of Birth/Sex: Treating RN: 1960-04-26 (59 y.o. Tammy StandardF) Boehlein, Linda Primary Hines Naleah Kofoed: HA Ian BushmanSSA N, SA [place] Other Clinician: Referring Zaivion Kundrat: Treating Ulyess Muto/Extender: Lenda KelpStone III, Hoyt HA SSA N, SA [place] Weeks in Treatment: 11 Vital Signs Time Taken: 15:12 Temperature (F): 97.9 Height (in): 67 Pulse (bpm): 106 Weight (lbs): 268 Respiratory Rate (breaths/min): 18 Body Mass Index (BMI): 42 Blood Pressure (mmHg): 153/88 Reference Range: 80 - 120 mg / dl Electronic Signature(s) Signed: 10/14/2019 2:13:12 PM By: Karl Itoawkins, Destiny Entered By: Karl Itoawkins, Destiny on 10/12/2019 15:12:55

## 2019-11-04 ENCOUNTER — Institutional Professional Consult (permissible substitution): Payer: Medicare Other | Admitting: Pulmonary Disease

## 2019-11-22 ENCOUNTER — Other Ambulatory Visit: Payer: Self-pay | Admitting: Gastroenterology

## 2019-11-29 ENCOUNTER — Encounter (HOSPITAL_COMMUNITY): Payer: Self-pay | Admitting: Gastroenterology

## 2019-11-29 NOTE — Progress Notes (Signed)
Tammy Hines wants to cancel colonoscopy scheduled for 8/30/202 she did not know how to contact doctors office, so I spoke with Lesle Reek at Dr. Marlane Hatcher office to inform her to call patient to verify cancellation request.

## 2019-12-01 ENCOUNTER — Other Ambulatory Visit (HOSPITAL_COMMUNITY): Payer: Medicare Other

## 2019-12-05 ENCOUNTER — Ambulatory Visit (HOSPITAL_COMMUNITY): Admission: RE | Admit: 2019-12-05 | Payer: Medicare Other | Source: Home / Self Care | Admitting: Gastroenterology

## 2019-12-05 ENCOUNTER — Encounter (HOSPITAL_COMMUNITY): Admission: RE | Payer: Self-pay | Source: Home / Self Care

## 2019-12-05 HISTORY — DX: Fatty (change of) liver, not elsewhere classified: K76.0

## 2019-12-05 HISTORY — DX: Umbilical hernia without obstruction or gangrene: K42.9

## 2019-12-05 SURGERY — COLONOSCOPY WITH PROPOFOL
Anesthesia: Monitor Anesthesia Care

## 2020-01-06 DEATH — deceased

## 2021-11-13 ENCOUNTER — Encounter (INDEPENDENT_AMBULATORY_CARE_PROVIDER_SITE_OTHER): Payer: Self-pay
